# Patient Record
Sex: Female | Born: 1940
Health system: Southern US, Community
[De-identification: ages and names within clinical notes are randomized; demographics above are authoritative.]

## PROBLEM LIST (undated history)

## (undated) ENCOUNTER — Encounter

## (undated) ENCOUNTER — Ambulatory Visit

## (undated) ENCOUNTER — Telehealth

## (undated) ENCOUNTER — Ambulatory Visit: Attending: Pharmacist | Primary: Pharmacist

## (undated) ENCOUNTER — Encounter: Attending: Family Medicine | Primary: Family Medicine

## (undated) ENCOUNTER — Ambulatory Visit
Payer: MEDICARE | Attending: Adult Reconstructive Orthopaedic Surgery | Primary: Adult Reconstructive Orthopaedic Surgery

## (undated) ENCOUNTER — Ambulatory Visit: Payer: MEDICARE

## (undated) ENCOUNTER — Encounter: Attending: Anesthesiology | Primary: Anesthesiology

## (undated) ENCOUNTER — Ambulatory Visit: Payer: MEDICARE | Attending: Anesthesiology | Primary: Anesthesiology

## (undated) ENCOUNTER — Telehealth: Attending: Certified Registered" | Primary: Certified Registered"

## (undated) ENCOUNTER — Encounter: Attending: Certified Registered" | Primary: Certified Registered"

## (undated) ENCOUNTER — Encounter: Attending: Surgery | Primary: Surgery

## (undated) ENCOUNTER — Telehealth: Attending: Infectious Disease | Primary: Infectious Disease

## (undated) DIAGNOSIS — M1990 Unspecified osteoarthritis, unspecified site: Secondary | ICD-10-CM

## (undated) DIAGNOSIS — N200 Calculus of kidney: Secondary | ICD-10-CM

## (undated) DIAGNOSIS — F419 Anxiety disorder, unspecified: Secondary | ICD-10-CM

## (undated) DIAGNOSIS — I10 Essential (primary) hypertension: Secondary | ICD-10-CM

## (undated) DIAGNOSIS — Z944 Liver transplant status: Secondary | ICD-10-CM

## (undated) DIAGNOSIS — Z87442 Personal history of urinary calculi: Secondary | ICD-10-CM

## (undated) DIAGNOSIS — R002 Palpitations: Secondary | ICD-10-CM

## (undated) DIAGNOSIS — M109 Gout, unspecified: Secondary | ICD-10-CM

## (undated) DIAGNOSIS — E785 Hyperlipidemia, unspecified: Secondary | ICD-10-CM

## (undated) DIAGNOSIS — M549 Dorsalgia, unspecified: Secondary | ICD-10-CM

## (undated) DIAGNOSIS — G8929 Other chronic pain: Secondary | ICD-10-CM

## (undated) HISTORY — PX: BACK SURGERY: SHX140

## (undated) HISTORY — PX: LIVER TRANSPLANT: SHX410

## (undated) HISTORY — PX: APPENDECTOMY: SHX54

## (undated) HISTORY — PX: ABDOMINAL HYSTERECTOMY: SHX81

## (undated) MED ORDER — CHOLECALCIFEROL (VITAMIN D3) 25 MCG (1,000 UNIT) TABLET: Freq: Every day | ORAL | 0 days

## (undated) MED ORDER — GABAPENTIN 100 MG CAPSULE: Freq: Every evening | ORAL | 0 days

---
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
## 2000-07-21 ENCOUNTER — Encounter: Payer: Self-pay | Admitting: Internal Medicine

## 2000-07-21 ENCOUNTER — Ambulatory Visit (HOSPITAL_COMMUNITY): Admission: RE | Admit: 2000-07-21 | Discharge: 2000-07-21 | Payer: Self-pay | Admitting: Internal Medicine

## 2000-08-22 ENCOUNTER — Ambulatory Visit (HOSPITAL_COMMUNITY): Admission: RE | Admit: 2000-08-22 | Discharge: 2000-08-22 | Payer: Self-pay | Admitting: Internal Medicine

## 2000-08-25 ENCOUNTER — Emergency Department (HOSPITAL_COMMUNITY): Admission: EM | Admit: 2000-08-25 | Discharge: 2000-08-25 | Payer: Self-pay | Admitting: Internal Medicine

## 2000-09-12 ENCOUNTER — Encounter: Payer: Self-pay | Admitting: Family Medicine

## 2000-09-12 ENCOUNTER — Ambulatory Visit (HOSPITAL_COMMUNITY): Admission: RE | Admit: 2000-09-12 | Discharge: 2000-09-12 | Payer: Self-pay | Admitting: Family Medicine

## 2000-10-26 ENCOUNTER — Emergency Department (HOSPITAL_COMMUNITY): Admission: EM | Admit: 2000-10-26 | Discharge: 2000-10-27 | Payer: Self-pay | Admitting: *Deleted

## 2000-11-21 ENCOUNTER — Ambulatory Visit (HOSPITAL_COMMUNITY): Admission: RE | Admit: 2000-11-21 | Discharge: 2000-11-21 | Payer: Self-pay | Admitting: Family Medicine

## 2000-11-21 ENCOUNTER — Encounter: Payer: Self-pay | Admitting: Family Medicine

## 2000-12-19 ENCOUNTER — Encounter: Payer: Self-pay | Admitting: Family Medicine

## 2000-12-19 ENCOUNTER — Ambulatory Visit (HOSPITAL_COMMUNITY): Admission: RE | Admit: 2000-12-19 | Discharge: 2000-12-19 | Payer: Self-pay | Admitting: Family Medicine

## 2001-05-14 ENCOUNTER — Encounter: Payer: Self-pay | Admitting: Family Medicine

## 2001-05-14 ENCOUNTER — Inpatient Hospital Stay (HOSPITAL_COMMUNITY): Admission: AD | Admit: 2001-05-14 | Discharge: 2001-05-16 | Payer: Self-pay | Admitting: Family Medicine

## 2001-06-04 ENCOUNTER — Other Ambulatory Visit: Admission: RE | Admit: 2001-06-04 | Discharge: 2001-06-04 | Payer: Self-pay | Admitting: Family Medicine

## 2001-06-09 ENCOUNTER — Emergency Department (HOSPITAL_COMMUNITY): Admission: EM | Admit: 2001-06-09 | Discharge: 2001-06-09 | Payer: Self-pay | Admitting: Emergency Medicine

## 2001-07-02 ENCOUNTER — Emergency Department (HOSPITAL_COMMUNITY): Admission: EM | Admit: 2001-07-02 | Discharge: 2001-07-03 | Payer: Self-pay | Admitting: *Deleted

## 2001-07-20 ENCOUNTER — Emergency Department (HOSPITAL_COMMUNITY): Admission: EM | Admit: 2001-07-20 | Discharge: 2001-07-20 | Payer: Self-pay | Admitting: Emergency Medicine

## 2001-07-30 ENCOUNTER — Encounter: Payer: Self-pay | Admitting: Urology

## 2001-07-30 ENCOUNTER — Ambulatory Visit (HOSPITAL_COMMUNITY): Admission: RE | Admit: 2001-07-30 | Discharge: 2001-07-30 | Payer: Self-pay | Admitting: Urology

## 2001-08-05 ENCOUNTER — Emergency Department (HOSPITAL_COMMUNITY): Admission: EM | Admit: 2001-08-05 | Discharge: 2001-08-06 | Payer: Self-pay | Admitting: Emergency Medicine

## 2001-08-06 ENCOUNTER — Encounter: Payer: Self-pay | Admitting: Emergency Medicine

## 2001-11-19 ENCOUNTER — Other Ambulatory Visit: Admission: RE | Admit: 2001-11-19 | Discharge: 2001-11-19 | Payer: Self-pay | Admitting: Obstetrics and Gynecology

## 2001-12-19 ENCOUNTER — Encounter: Payer: Self-pay | Admitting: Family Medicine

## 2001-12-19 ENCOUNTER — Ambulatory Visit (HOSPITAL_COMMUNITY): Admission: RE | Admit: 2001-12-19 | Discharge: 2001-12-19 | Payer: Self-pay | Admitting: Family Medicine

## 2002-01-15 ENCOUNTER — Ambulatory Visit (HOSPITAL_COMMUNITY): Admission: RE | Admit: 2002-01-15 | Discharge: 2002-01-15 | Payer: Self-pay | Admitting: Family Medicine

## 2002-01-15 ENCOUNTER — Encounter: Payer: Self-pay | Admitting: Family Medicine

## 2002-07-28 ENCOUNTER — Emergency Department (HOSPITAL_COMMUNITY): Admission: EM | Admit: 2002-07-28 | Discharge: 2002-07-28 | Payer: Self-pay | Admitting: *Deleted

## 2002-10-13 ENCOUNTER — Emergency Department (HOSPITAL_COMMUNITY): Admission: EM | Admit: 2002-10-13 | Discharge: 2002-10-13 | Payer: Self-pay | Admitting: *Deleted

## 2002-12-27 ENCOUNTER — Encounter: Payer: Self-pay | Admitting: Family Medicine

## 2002-12-27 ENCOUNTER — Ambulatory Visit (HOSPITAL_COMMUNITY): Admission: RE | Admit: 2002-12-27 | Discharge: 2002-12-27 | Payer: Self-pay | Admitting: Family Medicine

## 2002-12-28 ENCOUNTER — Emergency Department (HOSPITAL_COMMUNITY): Admission: EM | Admit: 2002-12-28 | Discharge: 2002-12-28 | Payer: Self-pay | Admitting: *Deleted

## 2003-02-05 ENCOUNTER — Emergency Department (HOSPITAL_COMMUNITY): Admission: EM | Admit: 2003-02-05 | Discharge: 2003-02-06 | Payer: Self-pay | Admitting: *Deleted

## 2003-02-07 ENCOUNTER — Emergency Department (HOSPITAL_COMMUNITY): Admission: EM | Admit: 2003-02-07 | Discharge: 2003-02-07 | Payer: Self-pay | Admitting: Emergency Medicine

## 2003-04-12 ENCOUNTER — Observation Stay (HOSPITAL_COMMUNITY): Admission: EM | Admit: 2003-04-12 | Discharge: 2003-04-13 | Payer: Self-pay | Admitting: Emergency Medicine

## 2003-07-08 ENCOUNTER — Ambulatory Visit (HOSPITAL_COMMUNITY): Admission: RE | Admit: 2003-07-08 | Discharge: 2003-07-08 | Payer: Self-pay | Admitting: Family Medicine

## 2003-09-22 ENCOUNTER — Encounter: Admission: RE | Admit: 2003-09-22 | Discharge: 2003-09-22 | Payer: Self-pay | Admitting: Family Medicine

## 2003-10-10 ENCOUNTER — Encounter: Admission: RE | Admit: 2003-10-10 | Discharge: 2003-10-10 | Payer: Self-pay | Admitting: Family Medicine

## 2003-10-24 ENCOUNTER — Encounter: Admission: RE | Admit: 2003-10-24 | Discharge: 2003-10-24 | Payer: Self-pay | Admitting: Family Medicine

## 2003-11-20 ENCOUNTER — Emergency Department (HOSPITAL_COMMUNITY): Admission: EM | Admit: 2003-11-20 | Discharge: 2003-11-20 | Payer: Self-pay | Admitting: Emergency Medicine

## 2003-12-28 ENCOUNTER — Emergency Department (HOSPITAL_COMMUNITY): Admission: EM | Admit: 2003-12-28 | Discharge: 2003-12-28 | Payer: Self-pay | Admitting: Emergency Medicine

## 2004-01-05 ENCOUNTER — Ambulatory Visit (HOSPITAL_COMMUNITY): Admission: RE | Admit: 2004-01-05 | Discharge: 2004-01-05 | Payer: Self-pay | Admitting: Family Medicine

## 2004-03-16 ENCOUNTER — Encounter (HOSPITAL_COMMUNITY): Admission: RE | Admit: 2004-03-16 | Discharge: 2004-04-15 | Payer: Self-pay | Admitting: Neurosurgery

## 2004-03-29 ENCOUNTER — Emergency Department (HOSPITAL_COMMUNITY): Admission: EM | Admit: 2004-03-29 | Discharge: 2004-03-29 | Payer: Self-pay | Admitting: Emergency Medicine

## 2004-04-19 ENCOUNTER — Encounter: Admission: RE | Admit: 2004-04-19 | Discharge: 2004-04-19 | Payer: Self-pay | Admitting: Family Medicine

## 2004-05-10 ENCOUNTER — Encounter: Admission: RE | Admit: 2004-05-10 | Discharge: 2004-05-10 | Payer: Self-pay | Admitting: Family Medicine

## 2004-06-11 ENCOUNTER — Encounter: Admission: RE | Admit: 2004-06-11 | Discharge: 2004-06-11 | Payer: Self-pay | Admitting: Family Medicine

## 2005-01-10 ENCOUNTER — Ambulatory Visit (HOSPITAL_COMMUNITY): Admission: RE | Admit: 2005-01-10 | Discharge: 2005-01-10 | Payer: Self-pay | Admitting: Family Medicine

## 2005-01-13 ENCOUNTER — Ambulatory Visit (HOSPITAL_COMMUNITY): Admission: RE | Admit: 2005-01-13 | Discharge: 2005-01-13 | Payer: Self-pay | Admitting: Family Medicine

## 2005-04-11 ENCOUNTER — Ambulatory Visit (HOSPITAL_COMMUNITY): Admission: RE | Admit: 2005-04-11 | Discharge: 2005-04-11 | Payer: Self-pay | Admitting: Family Medicine

## 2005-06-07 ENCOUNTER — Encounter (HOSPITAL_COMMUNITY): Admission: RE | Admit: 2005-06-07 | Discharge: 2005-07-07 | Payer: Self-pay | Admitting: Orthopedic Surgery

## 2005-06-20 ENCOUNTER — Emergency Department (HOSPITAL_COMMUNITY): Admission: EM | Admit: 2005-06-20 | Discharge: 2005-06-21 | Payer: Self-pay | Admitting: Emergency Medicine

## 2005-07-12 ENCOUNTER — Encounter (HOSPITAL_COMMUNITY): Admission: RE | Admit: 2005-07-12 | Discharge: 2005-08-11 | Payer: Self-pay | Admitting: Orthopedic Surgery

## 2005-11-02 ENCOUNTER — Emergency Department (HOSPITAL_COMMUNITY): Admission: EM | Admit: 2005-11-02 | Discharge: 2005-11-02 | Payer: Self-pay | Admitting: Emergency Medicine

## 2005-11-06 ENCOUNTER — Emergency Department (HOSPITAL_COMMUNITY): Admission: EM | Admit: 2005-11-06 | Discharge: 2005-11-06 | Payer: Self-pay | Admitting: Emergency Medicine

## 2005-11-28 ENCOUNTER — Encounter (HOSPITAL_COMMUNITY): Admission: RE | Admit: 2005-11-28 | Discharge: 2005-12-28 | Payer: Self-pay | Admitting: Orthopaedic Surgery

## 2006-01-13 ENCOUNTER — Ambulatory Visit (HOSPITAL_COMMUNITY): Admission: RE | Admit: 2006-01-13 | Discharge: 2006-01-13 | Payer: Self-pay | Admitting: Family Medicine

## 2006-04-25 ENCOUNTER — Ambulatory Visit (HOSPITAL_COMMUNITY): Admission: RE | Admit: 2006-04-25 | Discharge: 2006-04-25 | Payer: Self-pay | Admitting: Family Medicine

## 2006-04-26 ENCOUNTER — Ambulatory Visit (HOSPITAL_COMMUNITY): Admission: RE | Admit: 2006-04-26 | Discharge: 2006-04-26 | Payer: Self-pay | Admitting: Family Medicine

## 2007-01-16 ENCOUNTER — Emergency Department (HOSPITAL_COMMUNITY): Admission: EM | Admit: 2007-01-16 | Discharge: 2007-01-16 | Payer: Self-pay | Admitting: Emergency Medicine

## 2007-01-16 ENCOUNTER — Ambulatory Visit (HOSPITAL_COMMUNITY): Admission: RE | Admit: 2007-01-16 | Discharge: 2007-01-16 | Payer: Self-pay | Admitting: Family Medicine

## 2007-06-11 ENCOUNTER — Ambulatory Visit (HOSPITAL_COMMUNITY): Admission: RE | Admit: 2007-06-11 | Discharge: 2007-06-11 | Payer: Self-pay | Admitting: Family Medicine

## 2008-02-08 ENCOUNTER — Ambulatory Visit (HOSPITAL_COMMUNITY): Admission: RE | Admit: 2008-02-08 | Discharge: 2008-02-08 | Payer: Self-pay | Admitting: Family Medicine

## 2008-05-16 ENCOUNTER — Emergency Department (HOSPITAL_COMMUNITY): Admission: EM | Admit: 2008-05-16 | Discharge: 2008-05-16 | Payer: Self-pay | Admitting: Emergency Medicine

## 2009-02-16 ENCOUNTER — Ambulatory Visit (HOSPITAL_COMMUNITY): Admission: RE | Admit: 2009-02-16 | Discharge: 2009-02-16 | Payer: Self-pay | Admitting: Family Medicine

## 2009-08-27 ENCOUNTER — Ambulatory Visit (HOSPITAL_COMMUNITY): Admission: RE | Admit: 2009-08-27 | Discharge: 2009-08-27 | Payer: Self-pay | Admitting: Family Medicine

## 2009-10-13 ENCOUNTER — Ambulatory Visit (HOSPITAL_COMMUNITY): Admission: RE | Admit: 2009-10-13 | Discharge: 2009-10-13 | Payer: Self-pay | Admitting: Family Medicine

## 2010-02-19 ENCOUNTER — Ambulatory Visit (HOSPITAL_COMMUNITY): Admission: RE | Admit: 2010-02-19 | Discharge: 2010-02-19 | Payer: Self-pay | Admitting: Family Medicine

## 2010-04-24 ENCOUNTER — Encounter: Payer: Self-pay | Admitting: Family Medicine

## 2010-04-25 ENCOUNTER — Encounter: Payer: Self-pay | Admitting: Family Medicine

## 2010-07-20 LAB — CBC
HCT: 39.3 % (ref 36.0–46.0)
Hemoglobin: 13.2 g/dL (ref 12.0–15.0)
MCHC: 33.7 g/dL (ref 30.0–36.0)
MCV: 89.6 fL (ref 78.0–100.0)
Platelets: 307 10*3/uL (ref 150–400)
RBC: 4.38 MIL/uL (ref 3.87–5.11)
RDW: 13.9 % (ref 11.5–15.5)
WBC: 8.6 10*3/uL (ref 4.0–10.5)

## 2010-07-20 LAB — BASIC METABOLIC PANEL
BUN: 18 mg/dL (ref 6–23)
CO2: 24 mEq/L (ref 19–32)
Calcium: 8.8 mg/dL (ref 8.4–10.5)
Chloride: 106 mEq/L (ref 96–112)
Creatinine, Ser: 0.9 mg/dL (ref 0.4–1.2)
GFR calc Af Amer: >60 mL/min (ref >60)
GFR calc non Af Amer: >60 mL/min (ref >60)
Glucose, Bld: 128 mg/dL — ABNORMAL HIGH (ref 70–99)
Potassium: 3.5 mEq/L (ref 3.5–5.1)
Sodium: 140 mEq/L (ref 135–145)

## 2010-07-20 LAB — URINALYSIS, ROUTINE W REFLEX MICROSCOPIC
Bilirubin Urine: NEGATIVE
Ketones, ur: NEGATIVE mg/dL
Nitrite: NEGATIVE
Protein, ur: NEGATIVE mg/dL
Specific Gravity, Urine: 1.02 (ref 1.005–1.030)
Urobilinogen, UA: 0.2 mg/dL (ref 0.0–1.0)

## 2010-07-20 LAB — DIFFERENTIAL
Basophils Relative: 1 % (ref 0–1)
Eosinophils Relative: 2 % (ref 0–5)
Lymphocytes Relative: 19 % (ref 12–46)
Monocytes Absolute: 0.7 10*3/uL (ref 0.1–1.0)
Monocytes Relative: 8 % (ref 3–12)
Neutro Abs: 6.1 10*3/uL (ref 1.7–7.7)

## 2010-07-20 LAB — URINE MICROSCOPIC-ADD ON

## 2010-07-20 LAB — POCT CARDIAC MARKERS
CKMB, poc: 1.1 ng/mL (ref 1.0–8.0)
CKMB, poc: <1 ng/mL — ABNORMAL LOW (ref 1.0–8.0)
Myoglobin, poc: 99.1 ng/mL (ref 12–200)
Troponin i, poc: <0.05 ng/mL (ref 0.00–0.09)
Troponin i, poc: <0.05 ng/mL (ref 0.00–0.09)

## 2010-08-02 ENCOUNTER — Other Ambulatory Visit (HOSPITAL_COMMUNITY): Payer: Self-pay | Admitting: Family Medicine

## 2010-08-02 ENCOUNTER — Ambulatory Visit (HOSPITAL_COMMUNITY)
Admission: RE | Admit: 2010-08-02 | Discharge: 2010-08-02 | Disposition: A | Discharge status: Home / Self Care | Payer: Medicare Other | Source: Ambulatory Visit | Attending: Family Medicine | Admitting: Family Medicine

## 2010-08-02 ENCOUNTER — Encounter (HOSPITAL_COMMUNITY): Payer: Self-pay

## 2010-08-02 DIAGNOSIS — R059 Cough, unspecified: Secondary | ICD-10-CM | POA: Insufficient documentation

## 2010-08-02 DIAGNOSIS — R058 Other specified cough: Secondary | ICD-10-CM

## 2010-08-02 DIAGNOSIS — R509 Fever, unspecified: Secondary | ICD-10-CM

## 2010-08-02 DIAGNOSIS — R05 Cough: Secondary | ICD-10-CM

## 2010-08-02 HISTORY — DX: Liver transplant status: Z94.4

## 2010-08-20 NOTE — Discharge Summary (Signed)
[facility]  Patient:    Linda Barr, Linda Barr Visit Number: [id number] MRN: [id number]          Service Type: EMS Location: ED Attending Physician:  Herbert Seta Dictated by:   Butch Penny, M.D. Admit Date:  06/09/2001 Discharge Date: 06/09/2001                             Discharge Summary  DIAGNOSES: 1. Gastroenteritis, acute. 2. Hypokalemia. 3. History of liver transplant. 4. History of hypertension. 5. Hyperlipidemia.  CONDITION:  Stable and improved at time of discharge.  BRIEF HISTORY:  This 70 year old white female was seen in the office on the day of admission for acute flank pain, severe vomiting, and diarrhea for approximately 24 hours.  The patient apparently had been having episodes of intermittent abdominal discomfort for a day or 2 prior to the onset of nausea and vomiting.  She stated that she could not keep anything down but had multiple episodes of vomiting and diarrhea and epigastric discomfort over 24 hours.  The patient appeared to be somewhat dehydrated in the office. The patient had a history of liver transplant x2 at [facility].  Because of the patients rather obvious discomfort and dehydration it was felt she should be admitted to the hospital for IV fluids and further evaluation.  PHYSICAL EXAMINATION:  GENERAL:  Uncomfortable white female.  VITAL SIGNS:  Blood pressure 140/80, pulse 60, respirations 18, temperature 100.0.  HEENT:  Eyes:  PERRLA.  TM negative.  Oropharynx benign.  NECK:  Supple.  No JVD, or thyroid abnormalities.  LUNGS:  Clear to percussion and auscultation.  HEART:  Regular rate and rhythm no murmurs.  ABDOMEN:  Tenderness in the mid abdomen.  SKIN:  Loss of tissue turgor.  EXTREMITIES:  Free of edema.  LABORATORY DATA:  Admission CBC, WBC 10,600 with hemoglobin 13.3, hematocrit 38.1, 92 neutrophils, 3 lymphocytes.  Chemistries:  Sodium 140, potassium 3.9, chloride 109, CO2 24, glucose 126, BUN  15, creatinine 0.9.  Calcium 8.6.  Subsequent chemistries on May 15, 2001:  Sodium 140, potassium 3.8, chloride 109, CO2 27, glucose 104, BUN 9, creatinine 1.0.   Liver profile: AST 16, ALT 20, ALP 73, total bilirubin 0.4.  Urinalysis essentially negative. Urine culture no growth.  C. difficile and stool was negative.  Stool cultures were negative. RPR nonreactive.  X-RAYS:   Chest x-ray with no evidence of infiltrate or congestive heart failure.  HOSPITAL COURSE:  The patient at the time of admission was placed on D-5 1/2 normal saline at 125 cc./hr. and vital signs q.i.d.  MEDICATIONS: 1. She was given Phenergan IV 12.5 mg q.4h. p.r.n. for nausea. 2. She was started on IV Levaquin 500 mg daily. 3. Levbid 1 b.i.d. 4. Morphine 4 mg IV q.4h. p.r.n. for pain. 5. Robitussin 1 teaspoon q.i.d. 6. She was continued on lipitor 10 mg daily. 7. Verapamil 240 mg daily.  The patient improved shortly after admission and was started on a soft diet. The patient showed progressive improvement with reference to her dehydration and was able to be discharged after 2 days of hospitalization.  DISCHARGE MEDICATIONS: 1. The patient was discharged on Levaquin 500 mg daily. 2. Verapamil SR 240 mg daily. 3. Levbid 1 b.i.d. 4. Robitussin 1 teaspoon q.4h p.r.n. Dictated by:   Butch Penny, M.D. Attending Physician:  Herbert Seta DD:  06/19/01 TD:  06/20/01 Job: [id number]

## 2010-08-20 NOTE — H&P (Signed)
[facility]  Patient:    Linda Barr, Linda Barr Visit Number: [id number] MRN: [id number]          Service Type: MED Location: 3A A330 01 Attending Physician:  Alice Reichert Dictated by:   Butch Penny, M.D. Admit Date:  05/14/2001                           History and Physical  CHIEF COMPLAINT:  This is a 70 year old white female who was seen in the office on the day of admission with the chief complaint being severe vomiting and diarrhea for approximately 24 hours.  HISTORY OF PRESENT ILLNESS:  This patient apparently had been having episodes of intermittent abdominal discomfort for a day or two prior to the onset of vomiting and diarrhea.  She states that she cannot keep anything down.  She has had multiple episodes of vomiting and diarrhea and epigastric discomfort for the last 24 hours.  The patient appeared to be somewhat dehydrated in the office.  This patient has had a previous history of liver transplant x 2 at [facility]  Because of the patients rather obvious discomfort and dehydration, it was felt that she should be admitted to the hospital for IV fluids and further evaluation.  FAMILY HISTORY:  Mother had a heart attack.  An uncle was a diabetic.  SOCIAL HISTORY:  The patient is a former cigarette smoker.  Does not smoke or drink now.  ALLERGIES:  CODEINE.  CURRENT MEDICATIONS 1. Lipitor 10 mg. 2. Verapamil 240 mg. 3. Prometa 2 mg b.i.d.  PAST MEDICAL/SURGICAL HISTORY 1. The patient has a history of having had a liver transplant at [facility]    in 1994, and then again one year later. 2. She has a history of hypertension. 3. History of chronic low back pain. 4. Apparently the patient did have hepatitis prior to the liver transplant.  REVIEW OF SYSTEMS:  HEENT:  Negative except for nasal congestion. CARDIOPULMONARY:  The patient did have some cough which was productive.  A low-grade fever as well.  No hemoptysis or dyspnea.  GI:  Bouts of  nausea and vomiting, epigastric pain.  PHYSICAL EXAMINATION  GENERAL:  An uncomfortable white female.  VITAL SIGNS:  Blood pressure 140/80, pulse 60, respirations 18, temperature 100 degrees.  HEENT:  Pupils equal, round, reactive to light and accommodation.  Tympanic membranes negative.  Oropharynx benign.  NECK:  Supple with no jugular venous distention or thyroid abnormalities.  LUNGS:  Clear to P&A.  HEART:  Regular rhythm, no murmurs.  ABDOMEN:  Tenderness in the midabdomen.  SKIN:  Loss of skin turgor.  EXTREMITIES:  Free of edema.  ADMITTING DIAGNOSES 1. Gastroenteritis, acute. 2. History of liver transplant. Dictated by:   Butch Penny, M.D. Attending Physician:  Alice Reichert DD:  05/14/01 TD:  05/14/01 Job: [id number]

## 2010-08-20 NOTE — Group Therapy Note (Signed)
NAME:  Linda Barr, Linda Barr NO.:  [id number]   MEDICAL RECORD NO.:  [id number]                  PATIENT TYPE:   LOCATION:                                       FACILITY:   PHYSICIAN:  Angus G. Renard Matter, M.D.              DATE OF BIRTH:   DATE OF PROCEDURE:  04/13/2003  DATE OF DISCHARGE:                                   PROGRESS NOTE   This patient was admitted with nausea and vomiting secondary to medication  which she received in the ED. She was admitted through the ED with severe  back pain. She was unrelieved by Toradol and was given Dilaudid and began to  vomit and had retching following this.   OBJECTIVE:  VITAL SIGNS: Blood pressure 114/73, respirations 20, pulse 59,  temperature 98.1. LUNGS:  Clear to P&A.  HEART:  Regular rhythm.  ABDOMEN:  No palpable organs or masses.   ASSESSMENT:  The patient was admitted with retching and vomiting following  pain medication.  She appears to be better and will be discharged today.      ___________________________________________                                            Ishmael Holter. Renard Matter, M.D.   AGM/MEDQ  D:  04/13/2003  T:  04/13/2003  Job:  [id number]

## 2010-08-20 NOTE — H&P (Signed)
NAME:  Linda Barr, Linda Barr                            ACCOUNT NO.:  [id number]   MEDICAL RECORD NO.:  [id number]                   PATIENT TYPE:  OBV   LOCATION:  A330                                 FACILITY:  [facility]   PHYSICIAN:  Angus G. Renard Matter, M.D.              DATE OF BIRTH:  [birth year]   DATE OF ADMISSION:  04/12/2003  DATE OF DISCHARGE:                                HISTORY & PHYSICAL   A 70 year old white female presented herself to the emergency department  with back pain. She was seen in the office this week with back pain of  increasing severity. She had had several spinal injections at [facility]  which she states did not help a great deal. She was placed on prednisone  analgesic as an outpatient, but this pain became progressively worse.  She  was seen in the emergency department, given injection of Toradol for pain.  This did not completely relieve the pain; therefore, she was given Dilaudid.  This made her sicker. She began vomiting and was unable to be discharged  because of intractable vomiting. She was found to have urinary tract  infection as well.   Lab studies revealed CBC with WBC of 9100, hemoglobin 12.9, hematocrit 38.3.  Chemistry reveals sodium 139, potassium 4.3, chloride 107, CO2 28, glucose  98, BUN 17, creatinine 0.9, calcium 9.2. Liver enzymes reveal total protein  6, albumin 3.7, SGOT 25, SGPT 30, alkaline phosphatase 91, bilirubin 0.4,  direct bilirubin 0.1, indirect bilirubin 0.3.   The patient was catheterized. A specimen was sent for culture and  sensitivity, was started on intravenous fluids, and subsequently was  admitted.   SOCIAL HISTORY:  The patient does not smoke or drink alcohol.   FAMILY HISTORY:  See previous record.   PAST MEDICAL AND SURGICAL HISTORY:  The patient has a history of liver  transplant at [facility] several  years ago because of liver failure  secondary to hepatitis C. The patient has a history of having had  hysterectomy, T&A.  She has a medical history of hypertension, kidney  stones, back pain.   ALLERGIES:  CODEINE and DEMEROL.   MEDICATIONS:  Verapamil, Tylenol, Ativan, Allegra.   REVIEW OF SYSTEMS:  HEENT:  Negative. CARDIOPULMONARY:  No cough,  hemoptysis, or dyspnea. GI: Bouts of vomiting. No diarrhea. Chronic back  pain.   PHYSICAL EXAMINATION:  GENERAL: Uncomfortable white female.  VITAL SIGNS: Blood pressure 146/97, pulse 79, respirations 18, temperature  98.  HEENT:  PERRLA. TMs negative. Oropharynx benign.  NECK: Supple with no JVD or thyroid abnormalities.  LUNGS: Clear to P&A.  HEART: Regular rhythm, no murmurs.  ABDOMEN: No palpable organs or masses.  BACK: No CVA tenderness.  NEUROLOGIC: No focal deficits.  SKIN: Warm and dry.  EXTREMITIES: Free of edema.   DIAGNOSES:  1. Intractable secondary to medication.  2. Urinary tract infection.  3. Low back pain.  4. Spinal stenosis.  5. History of liver transplant.     ___________________________________________                                         Ishmael Holter. Renard Matter, M.D.   AGM/MEDQ  D:  04/12/2003  T:  04/12/2003  Job:  [id number]

## 2010-10-04 ENCOUNTER — Emergency Department (HOSPITAL_COMMUNITY): Payer: Medicare Other

## 2010-10-04 ENCOUNTER — Emergency Department (HOSPITAL_COMMUNITY)
Admission: EM | Admit: 2010-10-04 | Discharge: 2010-10-04 | Disposition: A | Discharge status: Home / Self Care | Payer: Medicare Other | Attending: Emergency Medicine | Admitting: Emergency Medicine

## 2010-10-04 DIAGNOSIS — Z79899 Other long term (current) drug therapy: Secondary | ICD-10-CM | POA: Insufficient documentation

## 2010-10-04 DIAGNOSIS — Z944 Liver transplant status: Secondary | ICD-10-CM | POA: Insufficient documentation

## 2010-10-04 DIAGNOSIS — E785 Hyperlipidemia, unspecified: Secondary | ICD-10-CM | POA: Insufficient documentation

## 2010-10-04 DIAGNOSIS — R51 Headache: Secondary | ICD-10-CM | POA: Insufficient documentation

## 2010-10-04 LAB — URINALYSIS, ROUTINE W REFLEX MICROSCOPIC
Glucose, UA: NEGATIVE mg/dL
Hgb urine dipstick: NEGATIVE
Ketones, ur: NEGATIVE mg/dL
Protein, ur: NEGATIVE mg/dL
pH: 5.5 (ref 5.0–8.0)

## 2010-10-04 LAB — GLUCOSE, CSF: Glucose, CSF: 66 mg/dL (ref 43–76)

## 2010-10-04 LAB — CBC
HCT: 41.1 % (ref 36.0–46.0)
Hemoglobin: 13.3 g/dL (ref 12.0–15.0)
MCH: 30 pg (ref 26.0–34.0)
MCV: 92.6 fL (ref 78.0–100.0)
RBC: 4.44 MIL/uL (ref 3.87–5.11)
WBC: 11.2 10*3/uL — ABNORMAL HIGH (ref 4.0–10.5)

## 2010-10-04 LAB — URINE MICROSCOPIC-ADD ON

## 2010-10-04 LAB — PROTEIN, CSF: Total  Protein, CSF: 32 mg/dL (ref 15–45)

## 2010-10-04 LAB — BASIC METABOLIC PANEL
CO2: 26 mEq/L (ref 19–32)
Calcium: 9.2 mg/dL (ref 8.4–10.5)
Chloride: 104 mEq/L (ref 96–112)
Creatinine, Ser: 0.92 mg/dL (ref 0.50–1.10)
Glucose, Bld: 114 mg/dL — ABNORMAL HIGH (ref 70–99)

## 2010-10-04 LAB — CSF CELL COUNT WITH DIFFERENTIAL
Tube #: 4
WBC, CSF: 2 /mm3 (ref 0–5)

## 2010-10-04 LAB — DIFFERENTIAL
Eosinophils Absolute: 0.1 10*3/uL (ref 0.0–0.7)
Monocytes Relative: 9 % (ref 3–12)

## 2010-10-08 LAB — CSF CULTURE W GRAM STAIN: Culture: NO GROWTH

## 2011-01-03 ENCOUNTER — Other Ambulatory Visit (HOSPITAL_COMMUNITY): Payer: Self-pay | Admitting: Family Medicine

## 2011-01-03 DIAGNOSIS — Z139 Encounter for screening, unspecified: Secondary | ICD-10-CM

## 2011-01-13 LAB — COMPREHENSIVE METABOLIC PANEL
ALT: 19
AST: 16
Albumin: 3.9
Alkaline Phosphatase: 72
BUN: 22
CO2: 27
Calcium: 9
Chloride: 106
Creatinine, Ser: 1.06
GFR calc Af Amer: >60
GFR calc non Af Amer: 52 — ABNORMAL LOW
Glucose, Bld: 104 — ABNORMAL HIGH
Potassium: 4.3
Sodium: 138
Total Bilirubin: 0.6
Total Protein: 6.7

## 2011-01-13 LAB — DIFFERENTIAL
Basophils Absolute: 0
Basophils Relative: 0
Neutro Abs: 11.7 — ABNORMAL HIGH
Neutrophils Relative %: 77

## 2011-01-13 LAB — URINALYSIS, ROUTINE W REFLEX MICROSCOPIC
Nitrite: NEGATIVE
Specific Gravity, Urine: 1.01
Urobilinogen, UA: 0.2

## 2011-01-13 LAB — CBC
MCHC: 34.1
RDW: 14

## 2011-01-13 LAB — BASIC METABOLIC PANEL
BUN: 21
CO2: 27
Calcium: 9
Chloride: 106
Creatinine, Ser: 1.06
GFR calc Af Amer: >60
GFR calc non Af Amer: 52 — ABNORMAL LOW
Glucose, Bld: 104 — ABNORMAL HIGH
Potassium: 4.3
Sodium: 138

## 2011-01-13 LAB — URINE CULTURE

## 2011-01-13 LAB — URINE MICROSCOPIC-ADD ON

## 2011-02-25 ENCOUNTER — Ambulatory Visit (HOSPITAL_COMMUNITY)
Admission: RE | Admit: 2011-02-25 | Discharge: 2011-02-25 | Disposition: A | Discharge status: Home / Self Care | Payer: Medicare Other | Source: Ambulatory Visit | Attending: Family Medicine | Admitting: Family Medicine

## 2011-02-25 DIAGNOSIS — Z1231 Encounter for screening mammogram for malignant neoplasm of breast: Secondary | ICD-10-CM | POA: Insufficient documentation

## 2011-02-25 DIAGNOSIS — Z139 Encounter for screening, unspecified: Secondary | ICD-10-CM

## 2011-11-23 ENCOUNTER — Emergency Department (HOSPITAL_COMMUNITY)
Admission: EM | Admit: 2011-11-23 | Discharge: 2011-11-23 | Disposition: A | Discharge status: Home / Self Care | Payer: Medicare Other | Attending: Emergency Medicine | Admitting: Emergency Medicine

## 2011-11-23 ENCOUNTER — Encounter (HOSPITAL_COMMUNITY): Payer: Self-pay | Admitting: *Deleted

## 2011-11-23 ENCOUNTER — Emergency Department (HOSPITAL_COMMUNITY): Payer: Medicare Other

## 2011-11-23 DIAGNOSIS — G8929 Other chronic pain: Secondary | ICD-10-CM | POA: Insufficient documentation

## 2011-11-23 DIAGNOSIS — N390 Urinary tract infection, site not specified: Secondary | ICD-10-CM | POA: Insufficient documentation

## 2011-11-23 DIAGNOSIS — Z944 Liver transplant status: Secondary | ICD-10-CM | POA: Insufficient documentation

## 2011-11-23 DIAGNOSIS — E785 Hyperlipidemia, unspecified: Secondary | ICD-10-CM | POA: Insufficient documentation

## 2011-11-23 DIAGNOSIS — Z87442 Personal history of urinary calculi: Secondary | ICD-10-CM | POA: Insufficient documentation

## 2011-11-23 DIAGNOSIS — R002 Palpitations: Secondary | ICD-10-CM

## 2011-11-23 DIAGNOSIS — Z79899 Other long term (current) drug therapy: Secondary | ICD-10-CM | POA: Insufficient documentation

## 2011-11-23 DIAGNOSIS — I10 Essential (primary) hypertension: Secondary | ICD-10-CM | POA: Insufficient documentation

## 2011-11-23 HISTORY — DX: Calculus of kidney: N20.0

## 2011-11-23 HISTORY — DX: Other chronic pain: G89.29

## 2011-11-23 HISTORY — DX: Palpitations: R00.2

## 2011-11-23 HISTORY — DX: Dorsalgia, unspecified: M54.9

## 2011-11-23 LAB — CBC WITH DIFFERENTIAL/PLATELET
Basophils Absolute: 0 10*3/uL (ref 0.0–0.1)
Basophils Relative: 0 % (ref 0–1)
Eosinophils Absolute: 0.2 10*3/uL (ref 0.0–0.7)
Eosinophils Relative: 1 % (ref 0–5)
HCT: 44.3 % (ref 36.0–46.0)
MCH: 30.9 pg (ref 26.0–34.0)
MCHC: 33.2 g/dL (ref 30.0–36.0)
MCV: 93.1 fL (ref 78.0–100.0)
Monocytes Absolute: 1 10*3/uL (ref 0.1–1.0)
Neutro Abs: 7.8 10*3/uL — ABNORMAL HIGH (ref 1.7–7.7)
RDW: 13.5 % (ref 11.5–15.5)

## 2011-11-23 LAB — URINALYSIS, ROUTINE W REFLEX MICROSCOPIC
Bilirubin Urine: NEGATIVE
Ketones, ur: NEGATIVE mg/dL
Protein, ur: NEGATIVE mg/dL
Urobilinogen, UA: 0.2 mg/dL (ref 0.0–1.0)

## 2011-11-23 LAB — BASIC METABOLIC PANEL
Chloride: 104 mEq/L (ref 96–112)
GFR calc Af Amer: 77 mL/min — ABNORMAL LOW (ref >90)
GFR calc non Af Amer: 66 mL/min — ABNORMAL LOW (ref >90)
Potassium: 4.4 mEq/L (ref 3.5–5.1)

## 2011-11-23 LAB — URINE MICROSCOPIC-ADD ON

## 2011-11-23 MED ORDER — DEXTROSE 5 % IV SOLN
1.0000 g | Freq: Once | INTRAVENOUS | Status: AC
  Administered 2011-11-23: 1 g via INTRAVENOUS
  Filled 2011-11-23: qty 10

## 2011-11-23 MED ORDER — CEPHALEXIN 500 MG PO CAPS: 500.0000 mg | ORAL_CAPSULE | Freq: Four times a day (QID) | ORAL | Status: AC

## 2011-11-23 NOTE — ED Notes (Signed)
Pt c/o palpitations, weakness in her legs, cough and congestion, and urinary frequency. States that she has been coughing a lot and having back pain. History of liver transplant.

## 2011-11-23 NOTE — ED Provider Notes (Signed)
History     CSN: [id number]  Arrival date & time 11/23/11  1821   First MD Initiated Contact with Patient 11/23/11 1931      Chief Complaint  Patient presents with  . Palpitations    HPI Pt was seen at 1940.  Per pt, c/o gradual onset and persistence of constant urinary frequency and coughing since yesterday. Today she states she had an episode of her usual "palpitations" followed by "feeling weak all over."  Pt states she has a hx of chronic low back pain and "not sure if that is acting up too."  Denies CP/SOB, no abd pain, no N/V/D, no fevers, no rash.    Past Medical History  Diagnosis Date  . Hypertension   . Hx of liver transplant     1994, 1995 at [facility]  . Chronic back pain   . Palpitations   . Kidney stones     Past Surgical History  Procedure Date  . Liver transplant   . Abdominal hysterectomy   . Back surgery   . Appendectomy     History  Substance Use Topics  . Smoking status: Former Games developer  . Smokeless tobacco: Not on file  . Alcohol Use: No    Review of Systems ROS: Statement: All systems negative except as marked or noted in the HPI; Constitutional: Negative for fever and chills. ; ; Eyes: Negative for eye pain, redness and discharge. ; ; ENMT: Negative for ear pain, hoarseness, nasal congestion, sinus pressure and sore throat. ; ; Cardiovascular: Negative for chest pain, palpitations, diaphoresis, dyspnea and peripheral edema. ; ; Respiratory: +cough. Negative for wheezing and stridor. ; ; Gastrointestinal: Negative for nausea, vomiting, diarrhea, abdominal pain, blood in stool, hematemesis, jaundice and rectal bleeding. . ; ; Genitourinary: +dysuria, urinary frequency.  Negative for flank pain and hematuria. ; ; Musculoskeletal: +LBP. Negative for neck pain. Negative for swelling and trauma.; ; Skin: Negative for pruritus, rash, abrasions, blisters, bruising and skin lesion.; ; Neuro: Negative for headache, lightheadedness and neck stiffness. Negative for  weakness, altered level of consciousness , altered mental status, extremity weakness, paresthesias, involuntary movement, seizure and syncope.       Allergies  Codeine; Demerol; Dilaudid; and Morphine and related  Home Medications   Current Outpatient Rx  Name Route Sig Dispense Refill  . ACETAMINOPHEN 500 MG PO TABS Oral Take 500 mg by mouth 2 (two) times daily.    . ASPIRIN EC 81 MG PO TBEC Oral Take 81 mg by mouth 3 (three) times a week.    Marland Kitchen BIOTIN PO Oral Take 1 capsule by mouth daily.    Marland Kitchen EZETIMIBE 10 MG PO TABS Oral Take 10 mg by mouth at bedtime.    Marland Kitchen FLEXALL EX Apply externally Apply 1 application topically daily as needed. For pain    . PRAVASTATIN SODIUM 40 MG PO TABS Oral Take 40 mg by mouth at bedtime.    Marland Kitchen TACROLIMUS 1 MG PO CAPS Oral Take 1 mg by mouth 2 (two) times daily.    Marland Kitchen VERAPAMIL HCL ER 240 MG PO TBCR Oral Take 240 mg by mouth daily.      BP 171/87  Pulse 80  Temp 98.1 F (36.7 C) (Oral)  Resp 18  Ht 5' 4.5" (1.638 m)  Wt 195 lb (88.451 kg)  BMI 32.95 kg/m2  SpO2 97%  Physical Exam 1945: Physical examination:  Nursing notes reviewed; Vital signs and O2 SAT reviewed;  Constitutional: Well developed, Well nourished, Well hydrated,  In no acute distress; Head:  Normocephalic, atraumatic; Eyes: EOMI, PERRL, No scleral icterus; ENMT: Mouth and pharynx normal, Mucous membranes moist; Neck: Supple, Full range of motion, No lymphadenopathy; Cardiovascular: Regular rate and rhythm, No gallop; Respiratory: Breath sounds clear & equal bilaterally, No wheezes.  Speaking full sentences with ease, Normal respiratory effort/excursion; Chest: Nontender, Movement normal; Abdomen: Soft, Nontender, Nondistended, Normal bowel sounds; Genitourinary: No CVA tenderness; Spine:  No midline CS, TS, LS tenderness.  +TTP right lumbar paraspinal muscles; Extremities: Pulses normal, No tenderness, No edema, No calf edema or asymmetry.; Neuro: AA&Ox3, Major CN grossly intact.  Speech  clear. No facial droop. No gross focal motor or sensory deficits in extremities.; Skin: Color normal, Warm, Dry.   ED Course  Procedures   MDM  MDM Reviewed: nursing note, vitals and previous chart Reviewed previous: ECG Interpretation: ECG, labs and x-ray    Date: 11/23/2011  Rate: 85  Rhythm: normal sinus rhythm  QRS Axis: normal  Intervals: normal  ST/T Wave abnormalities: normal  Conduction Disutrbances:none  Narrative Interpretation:   Old EKG Reviewed: unchanged; no significant changes from previous EKG dated 10/04/2010.  Results for orders placed during the hospital encounter of 11/23/11  CBC WITH DIFFERENTIAL      Component Value Range   WBC 12.7 (*) 4.0 - 10.5 K/uL   RBC 4.76  3.87 - 5.11 MIL/uL   Hemoglobin 14.7  12.0 - 15.0 g/dL   HCT 46.9  62.9 - 52.8 %   MCV 93.1  78.0 - 100.0 fL   MCH 30.9  26.0 - 34.0 pg   MCHC 33.2  30.0 - 36.0 g/dL   RDW 41.3  24.4 - 01.0 %   Platelets 307  150 - 400 K/uL   Neutrophils Relative 62  43 - 77 %   Neutro Abs 7.8 (*) 1.7 - 7.7 K/uL   Lymphocytes Relative 29  12 - 46 %   Lymphs Abs 3.7  0.7 - 4.0 K/uL   Monocytes Relative 8  3 - 12 %   Monocytes Absolute 1.0  0.1 - 1.0 K/uL   Eosinophils Relative 1  0 - 5 %   Eosinophils Absolute 0.2  0.0 - 0.7 K/uL   Basophils Relative 0  0 - 1 %   Basophils Absolute 0.0  0.0 - 0.1 K/uL  TROPONIN I      Component Value Range   Troponin I <0.30  <0.30 ng/mL  URINALYSIS, ROUTINE W REFLEX MICROSCOPIC      Component Value Range   Color, Urine AMBER (*) YELLOW   APPearance CLEAR  CLEAR   Specific Gravity, Urine 1.025  1.005 - 1.030   pH 5.5  5.0 - 8.0   Glucose, UA NEGATIVE  NEGATIVE mg/dL   Hgb urine dipstick TRACE (*) NEGATIVE   Bilirubin Urine NEGATIVE  NEGATIVE   Ketones, ur NEGATIVE  NEGATIVE mg/dL   Protein, ur NEGATIVE  NEGATIVE mg/dL   Urobilinogen, UA 0.2  0.0 - 1.0 mg/dL   Nitrite NEGATIVE  NEGATIVE   Leukocytes, UA TRACE (*) NEGATIVE  BASIC METABOLIC PANEL      Component  Value Range   Sodium 139  135 - 145 mEq/L   Potassium 4.4  3.5 - 5.1 mEq/L   Chloride 104  96 - 112 mEq/L   CO2 26  19 - 32 mEq/L   Glucose, Bld 82  70 - 99 mg/dL   BUN 20  6 - 23 mg/dL   Creatinine, Ser 2.72  0.50 -  1.10 mg/dL   Calcium 9.8  8.4 - 40.9 mg/dL   GFR calc non Af Amer 66 (*) >90 mL/min   GFR calc Af Amer 77 (*) >90 mL/min  URINE MICROSCOPIC-ADD ON      Component Value Range   Squamous Epithelial / LPF RARE  RARE   WBC, UA 3-6  <3 WBC/hpf   RBC / HPF 3-6  <3 RBC/hpf   Bacteria, UA FEW (*) RARE   Dg Chest 2 View 11/23/2011  *RADIOLOGY REPORT*  Clinical Data: Cough  CHEST - 2 VIEW  Comparison: 10/04/2010  Findings: Heart size is at upper limits of normal.  The lungs are clear.  Mild flattening of hemidiaphragms may suggest emphysema. No pleural effusion.  No acute osseous finding.  IMPRESSION: Hyperinflation suggesting emphysema.  No acute finding.   Original Report Authenticated By: Harrel Lemon, M.D.      2130:  VS remain stable, HR 80's, NSR, afebrile.  Not orthostatic.  Pt states she felt "palpitations" on arrival to ED, but EKG completed in Triage on her arrival was NSR.  States she has been extensively eval by Cards Dr. Dietrich Pates for her palpitations and that this is not new for her.  Doubt ACS or PE as cause for her tachycardia at this time, continues to deny CP/SOB, not hypoxic, tachycardic or tachypneic in the ED today.  No abd pain on exam today, no hx aortic aneurysm on previous CT A/P.  Pt states she came to the ED today for eval of her cough and urinary frequency, concerned re: pneumonia or UTI.  Does not appear septic at this time.  Will tx for UTI, UC is pending; give 1st dose abx in the ED.  Pt wants to go home now.  Dx testing d/w pt and family.  Questions answered.  Verb understanding, agreeable to d/c home with outpt f/u.              Laray Anger, DO 11/26/11 [phone]

## 2011-11-24 LAB — URINE CULTURE: Colony Count: >=100000

## 2012-01-25 ENCOUNTER — Other Ambulatory Visit (HOSPITAL_COMMUNITY): Payer: Self-pay | Admitting: Family Medicine

## 2012-01-25 DIAGNOSIS — Z139 Encounter for screening, unspecified: Secondary | ICD-10-CM

## 2012-01-25 DIAGNOSIS — IMO0001 Reserved for inherently not codable concepts without codable children: Secondary | ICD-10-CM

## 2012-02-27 ENCOUNTER — Ambulatory Visit (HOSPITAL_COMMUNITY): Payer: Medicare Other

## 2012-03-09 ENCOUNTER — Ambulatory Visit (HOSPITAL_COMMUNITY): Payer: Medicare Other

## 2012-03-16 ENCOUNTER — Ambulatory Visit (HOSPITAL_COMMUNITY): Payer: Medicare Other

## 2012-03-30 ENCOUNTER — Ambulatory Visit (HOSPITAL_COMMUNITY): Payer: Medicare Other

## 2012-04-02 ENCOUNTER — Ambulatory Visit (HOSPITAL_COMMUNITY): Payer: Medicare Other

## 2012-04-29 ENCOUNTER — Emergency Department (HOSPITAL_COMMUNITY)
Admission: EM | Admit: 2012-04-29 | Discharge: 2012-04-29 | Disposition: A | Discharge status: Home / Self Care | Payer: Medicare Other | Attending: Emergency Medicine | Admitting: Emergency Medicine

## 2012-04-29 ENCOUNTER — Encounter (HOSPITAL_COMMUNITY): Payer: Self-pay | Admitting: Emergency Medicine

## 2012-04-29 DIAGNOSIS — Z87891 Personal history of nicotine dependence: Secondary | ICD-10-CM | POA: Insufficient documentation

## 2012-04-29 DIAGNOSIS — M5416 Radiculopathy, lumbar region: Secondary | ICD-10-CM

## 2012-04-29 DIAGNOSIS — Z8679 Personal history of other diseases of the circulatory system: Secondary | ICD-10-CM | POA: Insufficient documentation

## 2012-04-29 DIAGNOSIS — I10 Essential (primary) hypertension: Secondary | ICD-10-CM | POA: Insufficient documentation

## 2012-04-29 DIAGNOSIS — G8929 Other chronic pain: Secondary | ICD-10-CM | POA: Insufficient documentation

## 2012-04-29 DIAGNOSIS — Z87442 Personal history of urinary calculi: Secondary | ICD-10-CM | POA: Insufficient documentation

## 2012-04-29 DIAGNOSIS — M79609 Pain in unspecified limb: Secondary | ICD-10-CM | POA: Insufficient documentation

## 2012-04-29 DIAGNOSIS — IMO0002 Reserved for concepts with insufficient information to code with codable children: Secondary | ICD-10-CM | POA: Insufficient documentation

## 2012-04-29 DIAGNOSIS — Z7982 Long term (current) use of aspirin: Secondary | ICD-10-CM | POA: Insufficient documentation

## 2012-04-29 DIAGNOSIS — M545 Low back pain, unspecified: Secondary | ICD-10-CM | POA: Insufficient documentation

## 2012-04-29 DIAGNOSIS — M549 Dorsalgia, unspecified: Secondary | ICD-10-CM

## 2012-04-29 DIAGNOSIS — Z944 Liver transplant status: Secondary | ICD-10-CM | POA: Insufficient documentation

## 2012-04-29 DIAGNOSIS — Z79899 Other long term (current) drug therapy: Secondary | ICD-10-CM | POA: Insufficient documentation

## 2012-04-29 DIAGNOSIS — Z9889 Other specified postprocedural states: Secondary | ICD-10-CM | POA: Insufficient documentation

## 2012-04-29 MED ORDER — PREDNISONE 50 MG PO TABS
60.0000 mg | ORAL_TABLET | Freq: Once | ORAL | Status: AC
  Administered 2012-04-29: 60 mg via ORAL
  Filled 2012-04-29: qty 1

## 2012-04-29 MED ORDER — KETOROLAC TROMETHAMINE 60 MG/2ML IM SOLN: 60.0000 mg | Freq: Once | INTRAMUSCULAR | Status: DC

## 2012-04-29 MED ORDER — PREDNISONE 10 MG PO TABS: ORAL_TABLET | ORAL | Status: DC

## 2012-04-29 MED ORDER — KETOROLAC TROMETHAMINE 60 MG/2ML IM SOLN
60.0000 mg | Freq: Once | INTRAMUSCULAR | Status: AC
  Administered 2012-04-29: 60 mg via INTRAMUSCULAR
  Filled 2012-04-29: qty 2

## 2012-04-29 NOTE — ED Notes (Signed)
Pt c/o lower back  Pain that radiates down right leg, has hx of chronic lower back pain, denies any new injury,

## 2012-04-29 NOTE — ED Notes (Signed)
Patient c/o lower back pain that radiates into right leg. Per patient has chronic pain in back but the radiation into leg is new. Per patient has had liver surgery and is only able to take a limited amount of medications. Patient states "I haven't been able to go to [facility] where I usually go so I came here, cause this is happen ed before and I came here and they gave something that helped."

## 2012-04-29 NOTE — ED Notes (Signed)
Dr Wentz at bedside,  

## 2012-04-29 NOTE — ED Provider Notes (Signed)
History   This chart was scribed for Linda Melter, MD by Leone Payor, ED Scribe. This patient was seen in room APA18/APA18 and the patient's care was started at 1506.   CSN: [id number]  Arrival date & time 04/29/12  1229   First MD Initiated Contact with Patient 04/29/12 1506      Chief Complaint  Patient presents with  . Back Pain  . Leg Pain     The history is provided by the patient. No language interpreter was used.    GENIVA Linda Barr is a 72 y.o. female who presents to the Emergency Department complaining of constant, unchanged, severe lower back pain that radiates to the right leg starting 5 ago. Pt states the pain in her lower back is chronic but the radiation to the right leg is new. Pt had back surgery at [facility] years ago. Pt reports taking tylenol with mild relief. Pt reports only able to take a limited amount of medications due to having recent liver surgery. She denies fever, vomiting, dysuria, hematuria, constipation, change in bladder or bowel function.    Pt has h/o HTN, chronic back pain, liver transplant.  Pt is a former smoker but denies alcohol use.  Past Medical History  Diagnosis Date  . Hypertension   . Hx of liver transplant     1994, 1995 at [facility]  . Chronic back pain   . Palpitations   . Kidney stones     Past Surgical History  Procedure Date  . Liver transplant   . Abdominal hysterectomy   . Back surgery   . Appendectomy     No family history on file.  History  Substance Use Topics  . Smoking status: Former Smoker -- 1.0 packs/day    Types: Cigarettes    Quit date: 04/29/1990  . Smokeless tobacco: Never Used  . Alcohol Use: No    OB History    Grav Para Term Preterm Abortions TAB SAB Ect Mult Living   6 5 5  1  1   5       Review of Systems  A complete 10 system review of systems was obtained and all systems are negative except as noted in the HPI and PMH.    Allergies  Codeine; Demerol; Dilaudid; and Morphine and related  Home  Medications   Current Outpatient Rx  Name  Route  Sig  Dispense  Refill  . ACETAMINOPHEN 500 MG PO TABS   Oral   Take 500 mg by mouth every 6 (six) hours as needed. pain         . EZETIMIBE 10 MG PO TABS   Oral   Take 10 mg by mouth at bedtime.         Marland Kitchen PRAVASTATIN SODIUM 40 MG PO TABS   Oral   Take 40 mg by mouth at bedtime.         Marland Kitchen TACROLIMUS 1 MG PO CAPS   Oral   Take 1 mg by mouth 2 (two) times daily.         Marland Kitchen VERAPAMIL HCL ER 240 MG PO TBCR   Oral   Take 240 mg by mouth daily.         . ASPIRIN EC 81 MG PO TBEC   Oral   Take 81 mg by mouth 3 (three) times a week.         Marland Kitchen PREDNISONE 10 MG PO TABS      Take q day 6,5,4,3,2,1  21 tablet   0     BP 175/69  Pulse 102  Temp 97.6 F (36.4 C) (Oral)  Resp 20  Ht 5\' 5"  (1.651 m)  Wt 190 lb (86.183 kg)  BMI 31.62 kg/m2  SpO2 99%  Physical Exam  Nursing note and vitals reviewed. Constitutional: She is oriented to person, place, and time. She appears well-developed and well-nourished.  HENT:  Head: Normocephalic and atraumatic.  Eyes: Conjunctivae normal and EOM are normal. Pupils are equal, round, and reactive to light.  Neck: Normal range of motion and phonation normal. Neck supple.  Cardiovascular: Normal rate, regular rhythm and intact distal pulses.   Pulmonary/Chest: Effort normal and breath sounds normal. She exhibits no tenderness.  Abdominal: Soft. She exhibits no distension. There is no tenderness. There is no guarding.  Musculoskeletal: Normal range of motion.       Midline tenderness to low lumbar region. No step offs or deformities.   Positive straight leg raise on right at 45 degrees.   Neurological: She is alert and oriented to person, place, and time. She has normal strength. She exhibits normal muscle tone.  Skin: Skin is warm and dry.  Psychiatric: She has a normal mood and affect. Her behavior is normal. Judgment and thought content normal.    ED Course  Procedures  (including critical care time)  DIAGNOSTIC STUDIES: Oxygen Saturation is 99% on room air, normal by my interpretation.    COORDINATION OF CARE:  3:24 PM Discussed treatment plan which includes Prednisone and Toradol with pt at bedside and pt agreed to plan.    4:33PM she feels better, at this time. Pt advised to follow up with pain clinic, use heating pad and tylenol.   Nursing notes, applicable records and vitals reviewed.  Radiologic Images/Reports reviewed.   1. Back pain   2. Lumbar radicular pain       MDM  Exacerbation of chronic low back pain Doubt cauda equina, herniated disc or significant nerve impingement She is stable for discharge         I personally performed the services described in this documentation, which was scribed in my presence. The recorded information has been reviewed and is accurate.      Plan: Home Medications- prednisone, Tylenol; Home Treatments- heat application; Recommended follow up- follow up with pain management doctor, as soon as possible    Linda Melter, MD 04/29/12 2324

## 2012-05-19 ENCOUNTER — Encounter (HOSPITAL_COMMUNITY): Payer: Self-pay

## 2012-05-19 ENCOUNTER — Emergency Department (HOSPITAL_COMMUNITY)
Admission: EM | Admit: 2012-05-19 | Discharge: 2012-05-19 | Disposition: A | Discharge status: Home / Self Care | Payer: Medicare Other | Attending: Emergency Medicine | Admitting: Emergency Medicine

## 2012-05-19 DIAGNOSIS — I10 Essential (primary) hypertension: Secondary | ICD-10-CM | POA: Insufficient documentation

## 2012-05-19 DIAGNOSIS — Z7982 Long term (current) use of aspirin: Secondary | ICD-10-CM | POA: Insufficient documentation

## 2012-05-19 DIAGNOSIS — Z944 Liver transplant status: Secondary | ICD-10-CM | POA: Insufficient documentation

## 2012-05-19 DIAGNOSIS — M545 Low back pain, unspecified: Secondary | ICD-10-CM | POA: Insufficient documentation

## 2012-05-19 DIAGNOSIS — Z79899 Other long term (current) drug therapy: Secondary | ICD-10-CM | POA: Insufficient documentation

## 2012-05-19 DIAGNOSIS — G8929 Other chronic pain: Secondary | ICD-10-CM

## 2012-05-19 DIAGNOSIS — Z87891 Personal history of nicotine dependence: Secondary | ICD-10-CM | POA: Insufficient documentation

## 2012-05-19 DIAGNOSIS — Z87442 Personal history of urinary calculi: Secondary | ICD-10-CM | POA: Insufficient documentation

## 2012-05-19 DIAGNOSIS — Z9889 Other specified postprocedural states: Secondary | ICD-10-CM | POA: Insufficient documentation

## 2012-05-19 MED ORDER — PREDNISONE 20 MG PO TABS: 40.0000 mg | ORAL_TABLET | Freq: Every day | ORAL | Status: DC

## 2012-05-19 MED ORDER — KETOROLAC TROMETHAMINE 60 MG/2ML IM SOLN
60.0000 mg | Freq: Once | INTRAMUSCULAR | Status: AC
  Administered 2012-05-19: 60 mg via INTRAMUSCULAR
  Filled 2012-05-19: qty 2

## 2012-05-19 MED ORDER — KETOROLAC TROMETHAMINE 10 MG PO TABS: 10.0000 mg | ORAL_TABLET | Freq: Four times a day (QID) | ORAL | Status: DC | PRN

## 2012-05-19 MED ORDER — DEXAMETHASONE SODIUM PHOSPHATE 4 MG/ML IJ SOLN
10.0000 mg | Freq: Once | INTRAMUSCULAR | Status: AC
  Administered 2012-05-19: 10 mg via INTRAMUSCULAR
  Filled 2012-05-19: qty 3

## 2012-05-19 NOTE — ED Provider Notes (Signed)
History     This chart was scribed for Vida Roller, MD, MD by Smitty Pluck, ED Scribe. The patient was seen in room APA15/APA15 and the patient's care was started at 1:31 PM.   CSN: [id number]  Arrival date & time 05/19/12  1238      Chief Complaint  Patient presents with  . Back Pain     The history is provided by the patient and medical records. No language interpreter was used.   Linda Barr is a 72 y.o. female who presents to the Emergency Department complaining of constant, moderate lumbar back pain that radiates to right leg onset 2 weeks ago. Pt reports having back surgery due to similar pain 12 years ago and pain was relieved after surgery but came back. Pt has hx of liver transplant at [facility] and has treatment at pain clinic at [facility]. Pt has appointment March 14 at pain clinic. She states she normally gets Toradol which provides relief of pain. Pt uses a cane when walking.    Past Medical History  Diagnosis Date  . Hypertension   . Hx of liver transplant     1994, 1995 at [facility]  . Chronic back pain   . Palpitations   . Kidney stones     Past Surgical History  Procedure Laterality Date  . Liver transplant    . Abdominal hysterectomy    . Back surgery    . Appendectomy      No family history on file.  History  Substance Use Topics  . Smoking status: Former Smoker -- 1.00 packs/day    Types: Cigarettes    Quit date: 04/29/1990  . Smokeless tobacco: Never Used  . Alcohol Use: No    OB History   Grav Para Term Preterm Abortions TAB SAB Ect Mult Living   6 5 5  1  1   5       Review of Systems  Constitutional: Negative for fever and chills.  Respiratory: Negative for shortness of breath.   Gastrointestinal: Negative for nausea and vomiting.  Musculoskeletal: Positive for back pain.  Neurological: Negative for weakness.  All other systems reviewed and are negative.    Allergies  Codeine; Demerol; Dilaudid; and Morphine and related  Home Medications    Current Outpatient Rx  Name  Route  Sig  Dispense  Refill  . acetaminophen (TYLENOL) 500 MG tablet   Oral   Take 500 mg by mouth every 6 (six) hours as needed. pain         . aspirin EC 81 MG tablet   Oral   Take 81 mg by mouth 3 (three) times a week.         . ezetimibe (ZETIA) 10 MG tablet   Oral   Take 10 mg by mouth at bedtime.         . pravastatin (PRAVACHOL) 40 MG tablet   Oral   Take 40 mg by mouth at bedtime.         . tacrolimus (PROGRAF) 1 MG capsule   Oral   Take 1 mg by mouth 2 (two) times daily.         . verapamil (CALAN-SR) 240 MG CR tablet   Oral   Take 240 mg by mouth daily.         Marland Kitchen ketorolac (TORADOL) 10 MG tablet   Oral   Take 1 tablet (10 mg total) by mouth every 6 (six) hours as needed for pain.  15 tablet   0   . predniSONE (DELTASONE) 10 MG tablet      Take q day 6,5,4,3,2,1   21 tablet   0   . predniSONE (DELTASONE) 20 MG tablet   Oral   Take 2 tablets (40 mg total) by mouth daily. Take 40 mg by mouth daily for 3 days, then 20mg  by mouth daily for 3 days, then 10mg  daily for 3 days   12 tablet   0     BP 176/86  Pulse 114  Temp(Src) 97.8 F (36.6 C) (Oral)  Resp 16  SpO2 99%  Physical Exam  Nursing note and vitals reviewed. Constitutional: She is oriented to person, place, and time. She appears well-developed and well-nourished. No distress.  HENT:  Head: Normocephalic and atraumatic.  Eyes: EOM are normal. Pupils are equal, round, and reactive to light.  Neck: Normal range of motion. Neck supple. No tracheal deviation present.  Cardiovascular: Normal rate, regular rhythm and normal heart sounds.   Pulmonary/Chest: Effort normal. No respiratory distress.  Abdominal: Soft. She exhibits no distension.  Musculoskeletal: Normal range of motion.  Paraspinal tenderness in right lower back Mild paraspinal tenderness in thoracic   Neurological: She is alert and oriented to person, place, and time.  The patient has  normal speech, normal memory, normal coordination of all 4 extremities without any limb ataxia. The patient has normal strength of all 4 extremities at the major muscle groups including shoulders, biceps, triceps, forearm, grips, quadriceps, hamstrings, calves. Normal sensation to light touch and pinprick of all 4 extremities and the trunk. No truncal ataxia, cranial nerves III through XII are intact.  Normal peripheral visual fields. Normal extraocular movements. Normal pupillary exam. No pronator drift, normal reflexes at the brachial radialis, biceps and patellar tendons. Normal position sense  Skin: Skin is warm and dry.  Psychiatric: She has a normal mood and affect. Her behavior is normal.    ED Course  Procedures (including critical care time) DIAGNOSTIC STUDIES: Oxygen Saturation is 99% on room air, normal by my interpretation.    COORDINATION OF CARE: 1:35 PM Discussed ED treatment with pt and pt agrees.     Labs Reviewed - No data to display No results found.   1. Chronic back pain       MDM  The patient has a chronic pain which seems to come in waves. She has no signs of neurologic abnormalities, she requested Toradol and to followup with her pain clinic. This has been given, Decadron given, patient stable for discharge.   I personally performed the services described in this documentation, which was scribed in my presence. The recorded information has been reviewed and is accurate.         Vida Roller, MD 05/19/12 2135

## 2012-05-19 NOTE — ED Notes (Signed)
Pt states she needs a pain shot in her right hip.

## 2012-07-09 ENCOUNTER — Ambulatory Visit (HOSPITAL_COMMUNITY)
Admission: RE | Admit: 2012-07-09 | Discharge: 2012-07-09 | Disposition: A | Discharge status: Home / Self Care | Payer: Medicare Other | Source: Ambulatory Visit | Attending: Family Medicine | Admitting: Family Medicine

## 2012-07-09 DIAGNOSIS — Z139 Encounter for screening, unspecified: Secondary | ICD-10-CM

## 2012-07-09 DIAGNOSIS — Z1231 Encounter for screening mammogram for malignant neoplasm of breast: Secondary | ICD-10-CM | POA: Insufficient documentation

## 2012-09-07 DIAGNOSIS — M961 Postlaminectomy syndrome, not elsewhere classified: Secondary | ICD-10-CM | POA: Insufficient documentation

## 2012-12-24 DIAGNOSIS — Z944 Liver transplant status: Secondary | ICD-10-CM | POA: Insufficient documentation

## 2012-12-24 DIAGNOSIS — D849 Immunodeficiency, unspecified: Secondary | ICD-10-CM | POA: Insufficient documentation

## 2013-07-05 ENCOUNTER — Other Ambulatory Visit (HOSPITAL_COMMUNITY): Payer: Self-pay | Admitting: Family Medicine

## 2013-07-05 DIAGNOSIS — Z1231 Encounter for screening mammogram for malignant neoplasm of breast: Secondary | ICD-10-CM

## 2013-07-05 DIAGNOSIS — Z139 Encounter for screening, unspecified: Secondary | ICD-10-CM

## 2013-07-05 DIAGNOSIS — Z1239 Encounter for other screening for malignant neoplasm of breast: Secondary | ICD-10-CM

## 2013-07-15 ENCOUNTER — Ambulatory Visit (HOSPITAL_COMMUNITY)
Admission: RE | Admit: 2013-07-15 | Discharge: 2013-07-15 | Disposition: A | Discharge status: Home / Self Care | Payer: Medicare Other | Source: Ambulatory Visit | Attending: Family Medicine | Admitting: Family Medicine

## 2013-07-15 DIAGNOSIS — Z1231 Encounter for screening mammogram for malignant neoplasm of breast: Secondary | ICD-10-CM | POA: Insufficient documentation

## 2014-02-03 ENCOUNTER — Encounter (HOSPITAL_COMMUNITY): Payer: Self-pay

## 2014-02-25 DIAGNOSIS — M179 Osteoarthritis of knee, unspecified: Secondary | ICD-10-CM | POA: Insufficient documentation

## 2014-04-18 DIAGNOSIS — I10 Essential (primary) hypertension: Secondary | ICD-10-CM | POA: Diagnosis not present

## 2014-04-20 ENCOUNTER — Observation Stay (HOSPITAL_COMMUNITY)
Admission: EM | Admit: 2014-04-20 | Discharge: 2014-04-22 | Disposition: A | Discharge status: Home / Self Care | Payer: Medicare Other | Attending: Family Medicine | Admitting: Family Medicine

## 2014-04-20 ENCOUNTER — Emergency Department (HOSPITAL_COMMUNITY): Payer: Medicare Other

## 2014-04-20 ENCOUNTER — Encounter (HOSPITAL_COMMUNITY): Payer: Self-pay | Admitting: Emergency Medicine

## 2014-04-20 DIAGNOSIS — Z7982 Long term (current) use of aspirin: Secondary | ICD-10-CM | POA: Insufficient documentation

## 2014-04-20 DIAGNOSIS — H538 Other visual disturbances: Secondary | ICD-10-CM | POA: Diagnosis not present

## 2014-04-20 DIAGNOSIS — R079 Chest pain, unspecified: Principal | ICD-10-CM | POA: Diagnosis present

## 2014-04-20 DIAGNOSIS — G8929 Other chronic pain: Secondary | ICD-10-CM | POA: Insufficient documentation

## 2014-04-20 DIAGNOSIS — N200 Calculus of kidney: Secondary | ICD-10-CM | POA: Diagnosis not present

## 2014-04-20 DIAGNOSIS — I10 Essential (primary) hypertension: Secondary | ICD-10-CM

## 2014-04-20 DIAGNOSIS — R109 Unspecified abdominal pain: Secondary | ICD-10-CM | POA: Insufficient documentation

## 2014-04-20 DIAGNOSIS — I517 Cardiomegaly: Secondary | ICD-10-CM | POA: Diagnosis not present

## 2014-04-20 DIAGNOSIS — R110 Nausea: Secondary | ICD-10-CM | POA: Diagnosis not present

## 2014-04-20 DIAGNOSIS — R072 Precordial pain: Secondary | ICD-10-CM | POA: Diagnosis not present

## 2014-04-20 DIAGNOSIS — R05 Cough: Secondary | ICD-10-CM | POA: Insufficient documentation

## 2014-04-20 DIAGNOSIS — R6883 Chills (without fever): Secondary | ICD-10-CM | POA: Diagnosis not present

## 2014-04-20 DIAGNOSIS — Z79899 Other long term (current) drug therapy: Secondary | ICD-10-CM | POA: Diagnosis not present

## 2014-04-20 DIAGNOSIS — E785 Hyperlipidemia, unspecified: Secondary | ICD-10-CM | POA: Insufficient documentation

## 2014-04-20 DIAGNOSIS — R197 Diarrhea, unspecified: Secondary | ICD-10-CM | POA: Insufficient documentation

## 2014-04-20 DIAGNOSIS — J3489 Other specified disorders of nose and nasal sinuses: Secondary | ICD-10-CM | POA: Insufficient documentation

## 2014-04-20 DIAGNOSIS — Z944 Liver transplant status: Secondary | ICD-10-CM | POA: Diagnosis not present

## 2014-04-20 DIAGNOSIS — Z87891 Personal history of nicotine dependence: Secondary | ICD-10-CM | POA: Insufficient documentation

## 2014-04-20 HISTORY — DX: Hyperlipidemia, unspecified: E78.5

## 2014-04-20 LAB — CBC WITH DIFFERENTIAL/PLATELET
BASOS PCT: 0 % (ref 0–1)
Basophils Absolute: 0 10*3/uL (ref 0.0–0.1)
Eosinophils Absolute: 0.1 10*3/uL (ref 0.0–0.7)
Eosinophils Relative: 1 % (ref 0–5)
HCT: 43.2 % (ref 36.0–46.0)
Hemoglobin: 14 g/dL (ref 12.0–15.0)
LYMPHS PCT: 24 % (ref 12–46)
Lymphs Abs: 3.3 10*3/uL (ref 0.7–4.0)
MCH: 30.8 pg (ref 26.0–34.0)
MCHC: 32.4 g/dL (ref 30.0–36.0)
MCV: 95.2 fL (ref 78.0–100.0)
MONO ABS: 1.2 10*3/uL — AB (ref 0.1–1.0)
MONOS PCT: 9 % (ref 3–12)
Neutro Abs: 9.2 10*3/uL — ABNORMAL HIGH (ref 1.7–7.7)
Neutrophils Relative %: 66 % (ref 43–77)
PLATELETS: 312 10*3/uL (ref 150–400)
RBC: 4.54 MIL/uL (ref 3.87–5.11)
RDW: 14.3 % (ref 11.5–15.5)
WBC: 13.8 10*3/uL — ABNORMAL HIGH (ref 4.0–10.5)

## 2014-04-20 LAB — COMPREHENSIVE METABOLIC PANEL
ALT: 33 U/L (ref 0–35)
ANION GAP: 8 (ref 5–15)
AST: 28 U/L (ref 0–37)
Albumin: 4.3 g/dL (ref 3.5–5.2)
Alkaline Phosphatase: 69 U/L (ref 39–117)
BUN: 23 mg/dL (ref 6–23)
CALCIUM: 9.1 mg/dL (ref 8.4–10.5)
CO2: 25 mmol/L (ref 19–32)
Chloride: 103 mEq/L (ref 96–112)
Creatinine, Ser: 0.88 mg/dL (ref 0.50–1.10)
GFR calc non Af Amer: 64 mL/min — ABNORMAL LOW (ref >90)
GFR, EST AFRICAN AMERICAN: 74 mL/min — AB (ref >90)
GLUCOSE: 128 mg/dL — AB (ref 70–99)
POTASSIUM: 4.3 mmol/L (ref 3.5–5.1)
Sodium: 136 mmol/L (ref 135–145)
TOTAL PROTEIN: 6.9 g/dL (ref 6.0–8.3)
Total Bilirubin: 0.6 mg/dL (ref 0.3–1.2)

## 2014-04-20 LAB — URINALYSIS, ROUTINE W REFLEX MICROSCOPIC
BILIRUBIN URINE: NEGATIVE
GLUCOSE, UA: NEGATIVE mg/dL
Hgb urine dipstick: NEGATIVE
KETONES UR: NEGATIVE mg/dL
Leukocytes, UA: NEGATIVE
NITRITE: NEGATIVE
PH: 5.5 (ref 5.0–8.0)
Protein, ur: NEGATIVE mg/dL
Specific Gravity, Urine: 1.01 (ref 1.005–1.030)
Urobilinogen, UA: 0.2 mg/dL (ref 0.0–1.0)

## 2014-04-20 LAB — TROPONIN I

## 2014-04-20 NOTE — ED Notes (Signed)
Pt reports she is a two time liver transplant recipient, usually takes her anti rejection med at 2200.

## 2014-04-20 NOTE — ED Provider Notes (Addendum)
CSN: [id number]     Arrival date & time 04/20/14  2114 History  This chart was scribed for Linda Sorrow, MD by Lowella Petties, ED Scribe. The patient was seen in room APA11/APA11. Patient's care was started at 10:10 PM.      Chief Complaint  Patient presents with  . Chest Pain   Patient is a 74 y.o. female presenting with chest pain. The history is provided by the patient. No language interpreter was used.  Chest Pain Pain location:  Substernal area Pain quality: tightness   Pain radiates to:  Does not radiate Pain radiates to the back: no   Pain severity:  Moderate Onset quality:  Sudden Duration:  20 minutes Timing:  Intermittent Chronicity:  New Relieved by:  Nothing Worsened by:  Nothing tried Ineffective treatments:  None tried Associated symptoms: abdominal pain, cough and nausea   Associated symptoms: no back pain, no dizziness, no fever, no headache, no shortness of breath and not vomiting   HPI Comments: Linda Barr is a 74 y.o. female who presents to the Emergency Department complaining of intermittent, central, chest tightness which began earlier tonight between 8 and 8:30 and lasted about 20 minutes. She has not felt the pain since then. She had a flu shot 3 days ago at her appointment with Dr. Reita Chard, and she was started on Bystolic because her BP was high.She states that prior to this she had had diarrhea, nausea, and cough for 4 days, and she was put on imodium. 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   PCP: Lanette Hampshire, MD  Past Medical History  Diagnosis Date  . Hypertension   . Hx of liver transplant     1994, 1995 at [facility]  . Chronic back pain   . Palpitations   . Kidney stones    Past Surgical History  Procedure Laterality Date  . Liver transplant    . Abdominal hysterectomy    . Back surgery    . Appendectomy     Family History  Problem Relation Age of Onset  . Stroke Mother   . Stroke Other     History  Substance Use Topics  . Smoking status: Former Smoker -- 1.00 packs/day    Types: Cigarettes    Quit date: 04/29/1990  . Smokeless tobacco: Never Used  . Alcohol Use: No   OB History    Gravida Para Term Preterm AB TAB SAB Ectopic Multiple Living   6 5 5  1  1   5      Review of Systems  Constitutional: Positive for chills. Negative for fever.  HENT: Positive for rhinorrhea. Negative for sore throat.   Eyes: Positive for visual disturbance.  Respiratory: Positive for cough. Negative for shortness of breath.   Cardiovascular: Positive for chest pain. Negative for leg swelling.  Gastrointestinal: Positive for nausea, abdominal pain and diarrhea. Negative for vomiting.  Genitourinary: Negative for dysuria.  Musculoskeletal: Negative for back pain and neck pain.  Skin: Negative for rash.  Neurological: Negative for dizziness, light-headedness and headaches.  Hematological: Does not bruise/bleed easily.  Psychiatric/Behavioral: Negative for confusion.   Allergies  Codeine; Demerol; Dilaudid; and Morphine and related  Home Medications   Prior to Admission medications   Medication Sig Start Date End Date Taking? Authorizing Provider  acetaminophen (TYLENOL) 500 MG tablet Take 500 mg by mouth every 6 (six) hours as needed for mild pain. pain   Yes Historical Provider,  MD  aspirin EC 81 MG tablet Take 81 mg by mouth 3 (three) times a week.   Yes Historical Provider, MD  calcium-vitamin D (OSCAL WITH D) 500-200 MG-UNIT per tablet Take 1 tablet by mouth daily.   Yes Historical Provider, MD  ezetimibe (ZETIA) 10 MG tablet Take 10 mg by mouth at bedtime.   Yes Historical Provider, MD  Nebivolol HCl 20 MG TABS Take 20 mg by mouth daily.   Yes Historical Provider, MD  pravastatin (PRAVACHOL) 40 MG tablet Take 40 mg by mouth at bedtime.   Yes Historical Provider, MD  tacrolimus (PROGRAF) 1 MG capsule Take 1 mg by mouth 2 (two) times daily.   Yes Historical Provider, MD  verapamil  (CALAN-SR) 240 MG CR tablet Take 240 mg by mouth daily.   Yes Historical Provider, MD  ketorolac (TORADOL) 10 MG tablet Take 1 tablet (10 mg total) by mouth every 6 (six) hours as needed for pain. Patient not taking: Reported on 04/20/2014 05/19/12   Johnna Acosta, MD  predniSONE (DELTASONE) 10 MG tablet Take q day 6,5,4,3,2,1 Patient not taking: Reported on 04/20/2014 04/29/12   Richarda Blade, MD  predniSONE (DELTASONE) 20 MG tablet Take 2 tablets (40 mg total) by mouth daily. Take 40 mg by mouth daily for 3 days, then 20mg  by mouth daily for 3 days, then 10mg  daily for 3 days Patient not taking: Reported on 04/20/2014 05/19/12   Johnna Acosta, MD   Triage Vitals: BP 184/59 mmHg  Pulse 51  Temp(Src) 98 F (36.7 C) (Oral)  Resp 23  Ht 5' 4.5" (1.638 m)  Wt 200 lb (90.719 kg)  BMI 33.81 kg/m2  SpO2 95% Physical Exam  Constitutional: She is oriented to person, place, and time. She appears well-developed and well-nourished. No distress.  HENT:  Head: Normocephalic and atraumatic.  Mouth/Throat: Oropharynx is clear and moist.  Eyes: Conjunctivae and EOM are normal. Pupils are equal, round, and reactive to light. No scleral icterus.  Neck: Neck supple. No tracheal deviation present.  Cardiovascular: Normal rate, regular rhythm and normal heart sounds.   No murmur heard. Pulmonary/Chest: Effort normal and breath sounds normal. No respiratory distress. She has no wheezes. She has no rales.  Abdominal: Soft. Bowel sounds are normal. She exhibits no distension and no mass. There is no tenderness. There is no rebound and no guarding.  Musculoskeletal: Normal range of motion.  Neurological: She is alert and oriented to person, place, and time.  Skin: Skin is warm and dry.  Psychiatric: She has a normal mood and affect. Her behavior is normal.  Nursing note and vitals reviewed.   ED Course  Procedures (including critical care time) DIAGNOSTIC STUDIES: Oxygen Saturation is 95% on room air,  adequate by my interpretation.    COORDINATION OF CARE: 10:21 PM-Discussed treatment plan which includes CXR, EKG, and lab work with pt at bedside and pt agreed to plan.  Results for orders placed or performed during the hospital encounter of 04/20/14  CBC with Differential  Result Value Ref Range   WBC 13.8 (H) 4.0 - 10.5 K/uL   RBC 4.54 3.87 - 5.11 MIL/uL   Hemoglobin 14.0 12.0 - 15.0 g/dL   HCT 43.2 36.0 - 46.0 %   MCV 95.2 78.0 - 100.0 fL   MCH 30.8 26.0 - 34.0 pg   MCHC 32.4 30.0 - 36.0 g/dL   RDW 14.3 11.5 - 15.5 %   Platelets 312 150 - 400 K/uL   Neutrophils Relative %  66 43 - 77 %   Neutro Abs 9.2 (H) 1.7 - 7.7 K/uL   Lymphocytes Relative 24 12 - 46 %   Lymphs Abs 3.3 0.7 - 4.0 K/uL   Monocytes Relative 9 3 - 12 %   Monocytes Absolute 1.2 (H) 0.1 - 1.0 K/uL   Eosinophils Relative 1 0 - 5 %   Eosinophils Absolute 0.1 0.0 - 0.7 K/uL   Basophils Relative 0 0 - 1 %   Basophils Absolute 0.0 0.0 - 0.1 K/uL  Comprehensive metabolic panel  Result Value Ref Range   Sodium 136 135 - 145 mmol/L   Potassium 4.3 3.5 - 5.1 mmol/L   Chloride 103 96 - 112 mEq/L   CO2 25 19 - 32 mmol/L   Glucose, Bld 128 (H) 70 - 99 mg/dL   BUN 23 6 - 23 mg/dL   Creatinine, Ser 0.88 0.50 - 1.10 mg/dL   Calcium 9.1 8.4 - 10.5 mg/dL   Total Protein 6.9 6.0 - 8.3 g/dL   Albumin 4.3 3.5 - 5.2 g/dL   AST 28 0 - 37 U/L   ALT 33 0 - 35 U/L   Alkaline Phosphatase 69 39 - 117 U/L   Total Bilirubin 0.6 0.3 - 1.2 mg/dL   GFR calc non Af Amer 64 (L) >90 mL/min   GFR calc Af Amer 74 (L) >90 mL/min   Anion gap 8 5 - 15  Troponin I  Result Value Ref Range   Troponin I <0.03 <0.031 ng/mL   Dg Chest 2 View  04/20/2014   CLINICAL DATA:  Chest pain for 1 day ; hypertension.  Former smoker.  EXAM: CHEST  2 VIEW  COMPARISON:  Chest radiograph November 23, 2011  FINDINGS: The cardiac silhouette is mildly enlarged, similar to prior examination. Mediastinal silhouette is nonsuspicious from a mildly calcified aortic  knob. Similarly increased lung volumes can be seen with COPD without superimposed pleural effusions or focal consolidation. No pneumothorax. Soft tissue planes and included osseous structures are nonsuspicious. Moderate to severe degenerative change of thoracic spine.  IMPRESSION: Stable cardiomegaly, no acute pulmonary process.   Electronically Signed   By: Elon Alas   On: 04/20/2014 22:40    Medications - No data to display    EKG Interpretation   Date/Time:  Sunday April 20 2014 21:27:10 EST Ventricular Rate:  62 PR Interval:  159 QRS Duration: 83 QT Interval:  421 QTC Calculation: 427 R Axis:   28 Text Interpretation:  Sinus rhythm Confirmed by Odis Wickey  MD, Tadan Shill  ([id number]) on 04/20/2014 10:26:15 PM      MDM   Final diagnoses:  Chest pain, unspecified chest pain type     Patient with chest pain earlier lasting about 20 minutes and then resolved. Patient's first troponin is negative. Will require repeat troponin second troponin can be done around 12 midnight. Patient's chest x-ray without any acute findings. Patient does have a liver transplant but her liver function test are normal. If the second troponin is negative and there is no new change in her symptoms patient can be discharged home. Patient has follow-up with Dr. Emilee Hero tomorrow.    I personally performed the services described in this documentation, which was scribed in my presence. The recorded information has been reviewed and is accurate.     Linda Sorrow, MD 04/20/14 2301  Patient has remained chest pain-free no pressure in the chest no sensation in the chest at all since arrival.     Linda Sorrow,  MD 04/20/14 2322

## 2014-04-20 NOTE — ED Notes (Signed)
Pt reports not feeling well over the past couple of days,, states she has been feeling weak for a while. Approx 1 hr PTA pt began having chest tightness.

## 2014-04-21 ENCOUNTER — Encounter (HOSPITAL_COMMUNITY): Payer: Self-pay | Admitting: *Deleted

## 2014-04-21 DIAGNOSIS — R001 Bradycardia, unspecified: Secondary | ICD-10-CM | POA: Diagnosis not present

## 2014-04-21 DIAGNOSIS — R072 Precordial pain: Secondary | ICD-10-CM | POA: Diagnosis not present

## 2014-04-21 DIAGNOSIS — E785 Hyperlipidemia, unspecified: Secondary | ICD-10-CM | POA: Diagnosis not present

## 2014-04-21 DIAGNOSIS — R079 Chest pain, unspecified: Secondary | ICD-10-CM | POA: Diagnosis not present

## 2014-04-21 DIAGNOSIS — I10 Essential (primary) hypertension: Secondary | ICD-10-CM | POA: Diagnosis not present

## 2014-04-21 LAB — COMPREHENSIVE METABOLIC PANEL
ALK PHOS: 66 U/L (ref 39–117)
ALT: 30 U/L (ref 0–35)
AST: 23 U/L (ref 0–37)
Albumin: 4.1 g/dL (ref 3.5–5.2)
Anion gap: 5 (ref 5–15)
BUN: 20 mg/dL (ref 6–23)
CALCIUM: 9 mg/dL (ref 8.4–10.5)
CHLORIDE: 103 meq/L (ref 96–112)
CO2: 29 mmol/L (ref 19–32)
CREATININE: 0.9 mg/dL (ref 0.50–1.10)
GFR calc non Af Amer: 62 mL/min — ABNORMAL LOW (ref >90)
GFR, EST AFRICAN AMERICAN: 72 mL/min — AB (ref >90)
GLUCOSE: 96 mg/dL (ref 70–99)
POTASSIUM: 4.2 mmol/L (ref 3.5–5.1)
SODIUM: 137 mmol/L (ref 135–145)
Total Bilirubin: 0.6 mg/dL (ref 0.3–1.2)
Total Protein: 6.6 g/dL (ref 6.0–8.3)

## 2014-04-21 LAB — TROPONIN I
Troponin I: <0.03 ng/mL (ref <0.031)
Troponin I: <0.03 ng/mL (ref <0.031)

## 2014-04-21 LAB — CBC
HCT: 40.5 % (ref 36.0–46.0)
HCT: 40.4 % (ref 36.0–46.0)
HEMOGLOBIN: 12.9 g/dL (ref 12.0–15.0)
Hemoglobin: 13 g/dL (ref 12.0–15.0)
MCH: 30.7 pg (ref 26.0–34.0)
MCH: 30.6 pg (ref 26.0–34.0)
MCHC: 32.1 g/dL (ref 30.0–36.0)
MCHC: 31.9 g/dL (ref 30.0–36.0)
MCV: 95.5 fL (ref 78.0–100.0)
MCV: 96 fL (ref 78.0–100.0)
PLATELETS: 265 10*3/uL (ref 150–400)
Platelets: 271 10*3/uL (ref 150–400)
RBC: 4.24 MIL/uL (ref 3.87–5.11)
RBC: 4.21 MIL/uL (ref 3.87–5.11)
RDW: 14.1 % (ref 11.5–15.5)
RDW: 14.3 % (ref 11.5–15.5)
WBC: 10.7 10*3/uL — ABNORMAL HIGH (ref 4.0–10.5)
WBC: 11.3 10*3/uL — AB (ref 4.0–10.5)

## 2014-04-21 LAB — CREATININE, SERUM
Creatinine, Ser: 0.89 mg/dL (ref 0.50–1.10)
GFR, EST AFRICAN AMERICAN: 73 mL/min — AB (ref >90)
GFR, EST NON AFRICAN AMERICAN: 63 mL/min — AB (ref >90)

## 2014-04-21 MED ORDER — SODIUM CHLORIDE 0.9 % IJ SOLN
3.0000 mL | Freq: Two times a day (BID) | INTRAMUSCULAR | Status: DC
  Administered 2014-04-21 – 2014-04-22 (×2): 3 mL via INTRAVENOUS

## 2014-04-21 MED ORDER — SODIUM CHLORIDE 0.9 % IJ SOLN
3.0000 mL | Freq: Two times a day (BID) | INTRAMUSCULAR | Status: DC
  Administered 2014-04-21: 3 mL via INTRAVENOUS

## 2014-04-21 MED ORDER — SODIUM CHLORIDE 0.9 % IJ SOLN: 3.0000 mL | INTRAMUSCULAR | Status: DC | PRN

## 2014-04-21 MED ORDER — ONDANSETRON HCL 4 MG/2ML IJ SOLN: 4.0000 mg | Freq: Four times a day (QID) | INTRAMUSCULAR | Status: DC | PRN

## 2014-04-21 MED ORDER — PRAVASTATIN SODIUM 40 MG PO TABS
40.0000 mg | ORAL_TABLET | Freq: Every day | ORAL | Status: DC
  Administered 2014-04-21: 40 mg via ORAL

## 2014-04-21 MED ORDER — ENOXAPARIN SODIUM 40 MG/0.4ML ~~LOC~~ SOLN
40.0000 mg | SUBCUTANEOUS | Status: DC
  Filled 2014-04-21: qty 0.4

## 2014-04-21 MED ORDER — ONDANSETRON HCL 4 MG PO TABS: 4.0000 mg | ORAL_TABLET | Freq: Four times a day (QID) | ORAL | Status: DC | PRN

## 2014-04-21 MED ORDER — TACROLIMUS 1 MG PO CAPS
1.0000 mg | ORAL_CAPSULE | Freq: Two times a day (BID) | ORAL | Status: DC
  Administered 2014-04-21 – 2014-04-22 (×2): 1 mg via ORAL
  Filled 2014-04-21 (×6): qty 1

## 2014-04-21 MED ORDER — ASPIRIN EC 81 MG PO TBEC
81.0000 mg | DELAYED_RELEASE_TABLET | ORAL | Status: DC
  Administered 2014-04-21: 81 mg via ORAL
  Filled 2014-04-21: qty 1

## 2014-04-21 MED ORDER — EZETIMIBE 10 MG PO TABS
10.0000 mg | ORAL_TABLET | Freq: Every day | ORAL | Status: DC
  Administered 2014-04-21: 10 mg via ORAL

## 2014-04-21 MED ORDER — ACETAMINOPHEN 500 MG PO TABS
500.0000 mg | ORAL_TABLET | Freq: Four times a day (QID) | ORAL | Status: DC | PRN
  Administered 2014-04-21 – 2014-04-22 (×4): 500 mg via ORAL
  Filled 2014-04-21 (×4): qty 1

## 2014-04-21 MED ORDER — VERAPAMIL HCL ER 240 MG PO TBCR
240.0000 mg | EXTENDED_RELEASE_TABLET | Freq: Every day | ORAL | Status: DC
  Administered 2014-04-22: 240 mg via ORAL
  Filled 2014-04-21 (×2): qty 1

## 2014-04-21 MED ORDER — SODIUM CHLORIDE 0.9 % IV SOLN: 250.0000 mL | INTRAVENOUS | Status: DC | PRN

## 2014-04-21 NOTE — ED Notes (Signed)
Patient has orthostatic vitals. Patient in restroom at this time.

## 2014-04-21 NOTE — Progress Notes (Signed)
Dr. Gwen Her notified due to the patient stating that they were suppose to have an "u/s" done today or the am and they had not seen anyone as of yet.  MD stated that the 2D echo did not have to be done as an inpatient that it could be done as an outpatient.  He went on to say that the patient could be discharged today since her symptoms seemed to be more related to medication that she was previously taking.  I notified Dr. Warren Lacy of what Dr. Gwen Her said and he said that the patient will most likely have the test done in the am.

## 2014-04-21 NOTE — Progress Notes (Signed)
UR completed 

## 2014-04-21 NOTE — ED Provider Notes (Signed)
Pt received at sign out. 74yo F, c/o mid-sternal chest "tightness" that occurred approximately 2000 PTA. Pt states she was sitting in a chair when the CP started. Pt walked to her bedroom and back to the chair, when she then told her family she needed to go to the ED for evaluation. Was associated with SOB and nausea. Lasted 20 minutes before spontaneously resolving.  Pt states she has been feeling generally "weak" and "tired" after starting bystolic 2 days ago during her PMD office visit (was told her "BP was high").  Discomfort has not returned while pt has been in the ED. EKG and troponin without acute abnl. Multiple ACS risk factors. Admit to medicine service.   Francine Graven, DO 04/21/14 [phone]

## 2014-04-21 NOTE — Progress Notes (Signed)
Subjective: The patient is feeling some better today. She was admitted with anterior chest discomfort. She had been hypertensive was started on medication for blood pressure was admitted through ED found to be bradycardic with heart rate 47 and 49 and was admitted for observation  Objective: Vital signs in last 24 hours: Temp:  [97.8 F (36.6 C)-98 F (36.7 C)] 97.8 F (36.6 C) (01/18 0204) Pulse Rate:  [47-66] 63 (01/18 0204) Resp:  [16-23] 20 (01/18 0204) BP: (165-184)/(59-88) 165/88 mmHg (01/18 0204) SpO2:  [94 %-100 %] 100 % (01/18 0204) Weight:  [90.719 kg (200 lb)-96.1 kg (211 lb 13.8 oz)] 96.1 kg (211 lb 13.8 oz) (01/18 0204) Weight change:     Intake/Output from previous day:   Intake/Output this shift:    Physical Exam: The patient is alert and oriented not having chest pain at the moment  Blood pressure 165/88 with pulse rate 63  HEENT negative  Neck supple no JVD or thyroid abnormalities  Heart regular rhythm no murmurs  Lungs clear to P&A  Abdomen no palpable organs or masses   Recent Labs  04/20/14 2135 04/21/14 0206  WBC 13.8* 10.7*  HGB 14.0 13.0  HCT 43.2 40.5  PLT 312 271   BMET  Recent Labs  04/20/14 2135 04/21/14 0206  NA 136  --   K 4.3  --   CL 103  --   CO2 25  --   GLUCOSE 128*  --   BUN 23  --   CREATININE 0.88 0.89  CALCIUM 9.1  --     Studies/Results: Dg Chest 2 View  04/20/2014   CLINICAL DATA:  Chest pain for 1 day ; hypertension.  Former smoker.  EXAM: CHEST  2 VIEW  COMPARISON:  Chest radiograph November 23, 2011  FINDINGS: The cardiac silhouette is mildly enlarged, similar to prior examination. Mediastinal silhouette is nonsuspicious from a mildly calcified aortic knob. Similarly increased lung volumes can be seen with COPD without superimposed pleural effusions or focal consolidation. No pneumothorax. Soft tissue planes and included osseous structures are nonsuspicious. Moderate to severe degenerative change of thoracic  spine.  IMPRESSION: Stable cardiomegaly, no acute pulmonary process.   Electronically Signed   By: Elon Alas   On: 04/20/2014 22:40    Medications:  . aspirin EC  81 mg Oral Once per day on Mon Wed Fri  . enoxaparin (LOVENOX) injection  40 mg Subcutaneous Q24H  . ezetimibe  10 mg Oral QHS  . pravastatin  40 mg Oral QHS  . sodium chloride  3 mL Intravenous Q12H  . sodium chloride  3 mL Intravenous Q12H  . tacrolimus  1 mg Oral BID  . verapamil  240 mg Oral Daily        Assessment/Plan: 1. Chest pain etiology undetermined-continue to cycle cardiac enzymes-obtain cardiology consult  2. Hypertension 2 continue verapamil  3. Recent sinus bradycardia continue to monitor-cardiology consult   LOS: 1 day   Heidie Krall G 04/21/2014, 6:25 AM

## 2014-04-21 NOTE — H&P (Signed)
PCP:   Lanette Hampshire, MD   Chief Complaint:  Chest pain  HPI: 74 year old female who   has a past medical history of Hypertension; liver transplant; Chronic back pain; Palpitations; Kidney stones; and Hyperlipidemia. Today came to the ED with chief complaint of chest pain which started around 8 PM tonight. Patient was recently seen by her PCP Dr. Emilee Hero and started on by stolid 20 mg by mouth daily. Patient says that since she started taking that medication she has been feeling generalized weakness area did she was also found to be bradycardic with heart rate 47-49 in the ED. She denies passing out, denies shortness of breath no nausea vomiting. She admits to having diarrhea for last 3-4 days. Patient has a history of liver transplant, hyperlipidemia.  Allergies:   Allergies  Allergen Reactions  . Codeine Other (See Comments)    Hallucinate, vomiting and sweating  . Demerol [Meperidine] Other (See Comments)    Hallucinations, vomiting and sweating.   . Dilaudid [Hydromorphone Hcl] Other (See Comments)    Hallucinations, sweating and vomiting  . Morphine And Related Other (See Comments)    Hallucinations, vomiting and sweating      Past Medical History  Diagnosis Date  . Hypertension   . Hx of liver transplant     1994, 1995 at [facility]  . Chronic back pain   . Palpitations   . Kidney stones   . Hyperlipidemia     Past Surgical History  Procedure Laterality Date  . Liver transplant    . Abdominal hysterectomy    . Back surgery    . Appendectomy      Prior to Admission medications   Medication Sig Start Date End Date Taking? Authorizing Provider  acetaminophen (TYLENOL) 500 MG tablet Take 500 mg by mouth every 6 (six) hours as needed for mild pain. pain   Yes Historical Provider, MD  aspirin EC 81 MG tablet Take 81 mg by mouth 3 (three) times a week.   Yes Historical Provider, MD  calcium-vitamin D (OSCAL WITH D) 500-200 MG-UNIT per tablet Take 1 tablet by mouth  daily.   Yes Historical Provider, MD  ezetimibe (ZETIA) 10 MG tablet Take 10 mg by mouth at bedtime.   Yes Historical Provider, MD  Nebivolol HCl 20 MG TABS Take 20 mg by mouth daily.   Yes Historical Provider, MD  pravastatin (PRAVACHOL) 40 MG tablet Take 40 mg by mouth at bedtime.   Yes Historical Provider, MD  tacrolimus (PROGRAF) 1 MG capsule Take 1 mg by mouth 2 (two) times daily.   Yes Historical Provider, MD  verapamil (CALAN-SR) 240 MG CR tablet Take 240 mg by mouth daily.   Yes Historical Provider, MD  ketorolac (TORADOL) 10 MG tablet Take 1 tablet (10 mg total) by mouth every 6 (six) hours as needed for pain. Patient not taking: Reported on 04/20/2014 05/19/12   Johnna Acosta, MD  predniSONE (DELTASONE) 10 MG tablet Take q day 6,5,4,3,2,1 Patient not taking: Reported on 04/20/2014 04/29/12   Richarda Blade, MD  predniSONE (DELTASONE) 20 MG tablet Take 2 tablets (40 mg total) by mouth daily. Take 40 mg by mouth daily for 3 days, then 20mg  by mouth daily for 3 days, then 10mg  daily for 3 days Patient not taking: Reported on 04/20/2014 05/19/12   Johnna Acosta, MD    Social History:  reports that she quit smoking about 23 years ago. Her smoking use included Cigarettes. She smoked 1.00 pack per  day. She has never used smokeless tobacco. She reports that she does not drink alcohol or use illicit drugs.  Family History  Problem Relation Age of Onset  . Stroke Mother   . Stroke Other      All the positives are listed in BOLD  Review of Systems:  HEENT: Headache, blurred vision, runny nose, sore throat Neck: Hypothyroidism, hyperthyroidism,,lymphadenopathy Chest : Shortness of breath, history of COPD, Asthma Heart : Chest pain, history of coronary arterey disease GI:  Nausea, vomiting, diarrhea, constipation, GERD GU: Dysuria, urgency, frequency of urination, hematuria Neuro: Stroke, seizures, syncope Psych: Depression, anxiety, hallucinations   Physical Exam: Blood pressure  184/59, pulse 49, temperature 98 F (36.7 C), temperature source Oral, resp. rate 23, height 5' 4.5" (1.638 m), weight 90.719 kg (200 lb), SpO2 94 %. Constitutional:   Patient is a well-developed and well-nourished female in no acute distress and cooperative with exam. Head: Normocephalic and atraumatic Mouth: Mucus membranes moist Eyes: PERRL, EOMI, conjunctivae normal Neck: Supple, No Thyromegaly Cardiovascular: RRR, S1 normal, S2 normal Pulmonary/Chest: CTAB, no wheezes, rales, or rhonchi Abdominal: Soft. Non-tender, non-distended, bowel sounds are normal, no masses, organomegaly, or guarding present.  Neurological: A&O x3, Strength is normal and symmetric bilaterally, cranial nerve II-XII are grossly intact, no focal motor deficit, sensory intact to light touch bilaterally.  Extremities : No Cyanosis, Clubbing or Edema  Labs on Admission:  Basic Metabolic Panel:  Recent Labs Lab 04/20/14 2135  NA 136  K 4.3  CL 103  CO2 25  GLUCOSE 128*  BUN 23  CREATININE 0.88  CALCIUM 9.1   Liver Function Tests:  Recent Labs Lab 04/20/14 2135  AST 28  ALT 33  ALKPHOS 69  BILITOT 0.6  PROT 6.9  ALBUMIN 4.3   No results for input(s): LIPASE, AMYLASE in the last 168 hours. No results for input(s): AMMONIA in the last 168 hours. CBC:  Recent Labs Lab 04/20/14 2135  WBC 13.8*  NEUTROABS 9.2*  HGB 14.0  HCT 43.2  MCV 95.2  PLT 312   Cardiac Enzymes:  Recent Labs Lab 04/20/14 2135  TROPONINI <0.03    BNP (last 3 results) No results for input(s): PROBNP in the last 8760 hours. CBG: No results for input(s): GLUCAP in the last 168 hours.  Radiological Exams on Admission: Dg Chest 2 View  04/20/2014   CLINICAL DATA:  Chest pain for 1 day ; hypertension.  Former smoker.  EXAM: CHEST  2 VIEW  COMPARISON:  Chest radiograph November 23, 2011  FINDINGS: The cardiac silhouette is mildly enlarged, similar to prior examination. Mediastinal silhouette is nonsuspicious from a  mildly calcified aortic knob. Similarly increased lung volumes can be seen with COPD without superimposed pleural effusions or focal consolidation. No pneumothorax. Soft tissue planes and included osseous structures are nonsuspicious. Moderate to severe degenerative change of thoracic spine.  IMPRESSION: Stable cardiomegaly, no acute pulmonary process.   Electronically Signed   By: Elon Alas   On: 04/20/2014 22:40    EKG: Independently reviewed. Normal sinus rhythm   Assessment/Plan Active Problems:   Chest pain   HTN (hypertension)  Chest pain We'll admit the patient under observation in telemetry. And obtain serial cardia enzymes. Cardiac enzymes in the ED are negative so far. EKG shows normal sinus rhythm with no ST-T changes.  Hypertension Will continue verapamil and I will hold bystolic at this time  Bradycardia Hold Bystolic and monitor on telemetry  DVT prophylaxis Lovenox  Code status: Patient is full code  Family discussion: Admission, patients condition and plan of care including tests being ordered have been discussed with the patient and *her daughter and mother at bedside* who indicate understanding and agree with the plan and Code Status.   Time Spent on Admission: 60 minutes  [facility] Pager: [phone] 04/21/2014, 12:55 AM  If 7PM-7AM, please contact night-coverage  [email or web address]  Password [email or web address]

## 2014-04-21 NOTE — Consult Note (Signed)
CARDIOLOGY CONSULT NOTE   Patient ID: Linda Barr MRN: [id number] DOB/AGE: 1941/02/07 74 y.o.  Admit Date: 04/20/2014 Referring Physician: Marjean Donna MD Primary Physician: Lanette Hampshire, MD Consulting Cardiologist: Rozann Lesches MD Primary Cardiologist: None - Saw Rothbart 16 yrs ago Reason for Consultation: Chest Pain and bradycardia  Clinical Summary Linda Barr is a 74 y.o.female with remote history of palpitations who was evaluated in the past by cardiology for pre-operative evaluation prior to liver transplant in 1994,(follwed in [facility] by Dr. Dory Horn on anti-rejection mediation-prograft), hypertension, and hypercholesterolemia, admitted with chest pain. She states that she had diarrhea all last week, and also a chest cold. Saw Dr. Everette Rank in the office on Friday for flu shot and was found to be hypertensive. She states she is normally hypertensive when she is anxious about her illness. She was given Bystolic 20 mg in the office and asked to wait for a while for BP to come down, and was given a Rx for same. She took it for two days. On Sunday she began to feel very weak, legs could not hold her, and foggy headed. She began to have chest "tightness, not pain". No diaphoresis, dizziness, or dyspnea.     On arrival to ER,  BP 184/59, HR 61 bpm, O2 Sat 98%. Troponin negative x 1. Some leukocytosis, 13.8, Glucose 128. CXR stable cardiomegaly, no pulmonary process. EKG with NSR 62 bpm. She was admitted to rule out cardiac etiology of chest pain. Subsequent troponin negative X 4. She is without complaint currently, and has had bystolic discontinued.      Allergies  Allergen Reactions  . Codeine Other (See Comments)    Hallucinate, vomiting and sweating  . Demerol [Meperidine] Other (See Comments)    Hallucinations, vomiting and sweating.   . Dilaudid [Hydromorphone Hcl] Other (See Comments)    Hallucinations, sweating and vomiting  . Morphine And Related Other (See Comments)     Hallucinations, vomiting and sweating    Medications Scheduled Medications: . aspirin EC  81 mg Oral Once per day on Mon Wed Fri  . enoxaparin (LOVENOX) injection  40 mg Subcutaneous Q24H  . ezetimibe  10 mg Oral QHS  . pravastatin  40 mg Oral QHS  . sodium chloride  3 mL Intravenous Q12H  . sodium chloride  3 mL Intravenous Q12H  . tacrolimus  1 mg Oral BID  . verapamil  240 mg Oral Daily    PRN Medications: sodium chloride, acetaminophen, ondansetron **OR** ondansetron (ZOFRAN) IV, sodium chloride   Past Medical History  Diagnosis Date  . Hypertension   . Hx of liver transplant     19 94, 1995 at [facility]  . Chronic back pain   . Palpitations   . Kidney stones   . Hyperlipidemia     Past Surgical History  Procedure Laterality Date  . Liver transplant    . Abdominal hysterectomy    . Back surgery    . Appendectomy      Family History  Problem Relation Age of Onset  . Stroke Mother   . Stroke Other     Social History Linda Barr reports that she quit smoking about 23 years ago. Her smoking use included Cigarettes. She smoked 1.00 pack per day. She has never used smokeless tobacco. Linda Barr reports that she does not drink alcohol.  Review of Systems Complete review of systems are found to be negative unless outlined in H&P above.  Physical Examination Blood pressure 170/76, pulse 62,  temperature 98.2 F (36.8 C), temperature source Oral, resp. rate 20, height 5' 4.5" (1.638 m), weight 211 lb 13.8 oz (96.1 kg), SpO2 96 %.  Intake/Output Summary (Last 24 hours) at 04/21/14 1146 Last data filed at 04/21/14 2993  Gross per 24 hour  Intake    240 ml  Output      0 ml  Net    240 ml    Telemetry: NSR  GEN: No acute distress HEENT: Conjunctiva and lids normal, oropharynx clear with moist mucosa. Neck: Supple, no elevated JVP or carotid bruits, no thyromegaly. Lungs: Clear to auscultation, nonlabored breathing at rest. Cardiac: Regular rate and rhythm, no S3  or significant systolic murmur, no pericardial rub. Abdomen: Soft, nontender, no hepatomegaly, bowel sounds present, no guarding or rebound. Extremities: No pitting edema, distal pulses 2+. Skin: Warm and dry. Musculoskeletal: No kyphosis. Neuropsychiatric: Alert and oriented x3, affect grossly appropriate.  Prior Cardiac Testing/Procedures Remote Holter and Stress test 18 years ago.  Lab Results  Basic Metabolic Panel:  Recent Labs Lab 04/20/14 2135 04/21/14 0206 04/21/14 0723  NA 136  --  137  K 4.3  --  4.2  CL 103  --  103  CO2 25  --  29  GLUCOSE 128*  --  96  BUN 23  --  20  CREATININE 0.88 0.89 0.90  CALCIUM 9.1  --  9.0    Liver Function Tests:  Recent Labs Lab 04/20/14 2135 04/21/14 0723  AST 28 23  ALT 33 30  ALKPHOS 69 66  BILITOT 0.6 0.6  PROT 6.9 6.6  ALBUMIN 4.3 4.1    CBC:  Recent Labs Lab 04/20/14 2135 04/21/14 0206 04/21/14 0723  WBC 13.8* 10.7* 11.3*  NEUTROABS 9.2*  --   --   HGB 14.0 13.0 12.9  HCT 43.2 40.5 40.4  MCV 95.2 95.5 96.0  PLT 312 271 265    Cardiac Enzymes:  Recent Labs Lab 04/20/14 2135 04/21/14 0001 04/21/14 0206 04/21/14 0723  TROPONINI <0.03 <0.03 <0.03 <0.03    Radiology: Dg Chest 2 View  04/20/2014   CLINICAL DATA:  Chest pain for 1 day ; hypertension.  Former smoker.  EXAM: CHEST  2 VIEW  COMPARISON:  Chest radiograph November 23, 2011  FINDINGS: The cardiac silhouette is mildly enlarged, similar to prior examination. Mediastinal silhouette is nonsuspicious from a mildly calcified aortic knob. Similarly increased lung volumes can be seen with COPD without superimposed pleural effusions or focal consolidation. No pneumothorax. Soft tissue planes and included osseous structures are nonsuspicious. Moderate to severe degenerative change of thoracic spine.  IMPRESSION: Stable cardiomegaly, no acute pulmonary process.   Electronically Signed   By: Elon Alas   On: 04/20/2014 22:40    ECG: NSR, rate of 62  bpm.    Impression and Recommendations  1.Chest Pain: Unclear etiology, but likely due to bradycardia on new medication, Bystolic. Had not had these episodes before until beginning the new medication. Feels better now that the medication has been stopped. Troponin and EKG are normal arguing against ACS. CVRF include: hypertension, hypercholesterolemia, age.  2. Hypertension: Currently elevated on verapamil 240 mg tablet, this am. Would not start back Bystolic as she clearly was unable to tolerate this. Can consider adding low dose ACE inhibitor or ARB and monitor her response. Would avoid any AV nodal blocking agents. Echo will be ordered for LV fx with history of longstanding hypertension. I have reviewed Care Everywhere for evidence of past echo to compare. There  was no report found.  3.Hypercholesterolemia: On Zetia and pravastatin. Check fasting lipids if she remains in the hospital.   4. Hx of Liver Transplant: On Prograft. Followed annually by [facility], Dr. Dory Horn. Last seen in July of 2015.    Signed: Phill Myron. Lawrence NP [place]  04/21/2014, 11:46 AM Co-Sign MD   Attending note:  Patient seen and examined. Reviewed records and modified above note by Ms. Lawrence NP. Ms. Dezeeuw presents with weakness and chest discomfort, associated temporally with recent start of Bystolic for hypertension on Friday. Baseline medications include verapamil. She was noted to be bradycardic with heart rates in the 40s to 60s at presentation. Subsequently, Bystolic has been stopped, telemetry shows no significant pauses or prolonged bradycardia. Cardiac markers are normal arguing against ACS. No acute ST segment changes by ECG. On examination she is comfortable, in bedside chair. Lungs are clear and nonlabored, cardiac exam reveals RRR without gallop. Pertinent history includes prior liver transplantation at [facility], she has done very well over the last 15-20 years. Echocardiogram is been  ordered to evaluate cardiac structure and function with longstanding history of hypertension, might also help to guide further treatment options. Would discontinue Bystolic altogether. Can probably stay on verapamil, and consider the addition of an ACE inhibitor or ARB if additional blood pressure control is needed. Unless her echocardiogram demonstrates any substantial abnormalities, she can likely be discharged from a cardiac perspective later today and follow-up with Dr. Everette Rank.  Satira Sark, M.D., F.A.C.C.

## 2014-04-22 DIAGNOSIS — E785 Hyperlipidemia, unspecified: Secondary | ICD-10-CM | POA: Diagnosis not present

## 2014-04-22 DIAGNOSIS — R072 Precordial pain: Secondary | ICD-10-CM | POA: Diagnosis not present

## 2014-04-22 DIAGNOSIS — I517 Cardiomegaly: Secondary | ICD-10-CM | POA: Diagnosis not present

## 2014-04-22 DIAGNOSIS — R001 Bradycardia, unspecified: Secondary | ICD-10-CM | POA: Diagnosis not present

## 2014-04-22 DIAGNOSIS — I10 Essential (primary) hypertension: Secondary | ICD-10-CM | POA: Diagnosis not present

## 2014-04-22 DIAGNOSIS — R079 Chest pain, unspecified: Secondary | ICD-10-CM | POA: Diagnosis not present

## 2014-04-22 NOTE — Care Management Note (Signed)
    Page 1 of 1   04/22/2014     11:58:56 AM CARE MANAGEMENT NOTE 04/22/2014  Patient:  Linda Barr, Linda Barr   Account Number:  [id number]  Date Initiated:  04/22/2014  Documentation initiated by:  Theophilus Kinds  Subjective/Objective Assessment:   Pt admitted from home with CP. Pt lives with family and will return home at discharge. Pt is fairly independent with ADL's. Pt uses a cane for prn use.     Action/Plan:   Anticipate discharge today. No CM needs noted.   Anticipated DC Date:  04/22/2014   Anticipated DC Plan:  [place]  CM consult      Choice offered to / List presented to:             Status of service:  Completed, signed off Medicare Important Message given?   (If response is "NO", the following Medicare IM given date fields will be blank) Date Medicare IM given:   Medicare IM given by:   Date Additional Medicare IM given:   Additional Medicare IM given by:    Discharge Disposition:  HOME/SELF CARE  Per UR Regulation:    If discussed at Long Length of Stay Meetings, dates discussed:    Comments:  04/22/14 [place], RN BSN CM

## 2014-04-22 NOTE — Progress Notes (Signed)
UR completed 

## 2014-04-22 NOTE — Progress Notes (Signed)
  Echocardiogram 2D Echocardiogram has been performed.  [place], [place] 04/22/2014, 10:57 AM

## 2014-04-22 NOTE — Progress Notes (Signed)
    Consulting cardiologist: Dr. Satira Sark  Seen for followup: Chest discomfort and bradycardia  Subjective:    Patient feels at baseline this morning. Main complaint is of chronic back pain, difficulty sleeping in her bed last night.  Objective:   Temp:  [98 F (36.7 C)-98.4 F (36.9 C)] 98 F (36.7 C) (01/19 0536) Pulse Rate:  [61-71] 71 (01/19 0536) Resp:  [18-20] 18 (01/19 0536) BP: (131-167)/(57-80) 167/78 mmHg (01/19 0536) SpO2:  [95 %] 95 % (01/19 0536) Weight:  [211 lb 6.7 oz (95.9 kg)] 211 lb 6.7 oz (95.9 kg) (01/19 0536) Last BM Date: 04/21/14  Filed Weights   04/20/14 2117 04/21/14 0204 04/22/14 0536  Weight: 200 lb (90.719 kg) 211 lb 13.8 oz (96.1 kg) 211 lb 6.7 oz (95.9 kg)    Intake/Output Summary (Last 24 hours) at 04/22/14 0945 Last data filed at 04/22/14 0927  Gross per 24 hour  Intake    720 ml  Output      0 ml  Net    720 ml    Telemetry: Sinus rhythm without significant bradycardia or pauses.  Exam:  General: Appears comfortable at rest.  Lungs: Clear, unlabored.  Cardiac: RRR, no gallop.  Extremities: No edema.   Lab Results:  Basic Metabolic Panel:  Recent Labs Lab 04/20/14 2135 04/21/14 0206 04/21/14 0723  NA 136  --  137  K 4.3  --  4.2  CL 103  --  103  CO2 25  --  29  GLUCOSE 128*  --  96  BUN 23  --  20  CREATININE 0.88 0.89 0.90  CALCIUM 9.1  --  9.0    CBC:  Recent Labs Lab 04/20/14 2135 04/21/14 0206 04/21/14 0723  WBC 13.8* 10.7* 11.3*  HGB 14.0 13.0 12.9  HCT 43.2 40.5 40.4  MCV 95.2 95.5 96.0  PLT 312 271 265    Cardiac Enzymes:  Recent Labs Lab 04/21/14 0206 04/21/14 0723 04/21/14 1340  TROPONINI <0.03 <0.03 <0.03     Medications:   Scheduled Medications: . aspirin EC  81 mg Oral Once per day on Mon Wed Fri  . enoxaparin (LOVENOX) injection  40 mg Subcutaneous Q24H  . ezetimibe  10 mg Oral QHS  . pravastatin  40 mg Oral QHS  . sodium chloride  3 mL Intravenous Q12H  . sodium  chloride  3 mL Intravenous Q12H  . tacrolimus  1 mg Oral BID  . verapamil  240 mg Oral Daily      PRN Medications:  sodium chloride, acetaminophen, ondansetron **OR** ondansetron (ZOFRAN) IV, sodium chloride   Assessment:   1. Chest discomfort and bradycardia, resolved with discontinuation of Bystolic. Cardiac markers argue against ACS, ECG without significant ST segment changes.  2. Essential hypertension. Patient has been on verapamil long-term. Blood pressure elevated today. Consider addition of ACE inhibitor or ARB for further management of hypertension if needed. These medications should not contribute to precipitating further bradycardia.  3. History of liver transplantation, on Prograf. She is followed at [facility].   Plan/Discussion:    Echocardiogram pending for today. We will review the results. Doubt that any additional inpatient cardiac testing will be necessary. Anticipate discharge home today per Dr. Everette Rank, and ongoing follow-up with him to continue.   Satira Sark, M.D., F.A.C.C.

## 2014-04-22 NOTE — Discharge Summary (Signed)
Physician Discharge Summary  Linda Barr OZH:[id number] DOB: 1940/09/07 DOA: 04/20/2014  PCP: Lanette Hampshire, MD  Admit date: 04/20/2014 Discharge date: 04/22/2014     Discharge Diagnoses:  1. Chest pain atypical 2. Sinus bradycardia 3. Essential hypertension hypotension on admission 4. Dyslipidemia 5. Liver transplant  Discharge Condition: Stable Disposition: Home  Diet recommendation: Regular  Filed Weights   04/20/14 2117 04/21/14 0204 04/22/14 0536  Weight: 90.719 kg (200 lb) 96.1 kg (211 lb 13.8 oz) 95.9 kg (211 lb 6.7 oz)    History of present illness:  The patient was admitted through the ED after having presented there with chief complaint of chest pain. The patient had been seen previously in primary care physician's office. It was noted at that time and she had elevated blood pressure. She was started on new blood pressure medication. She started feeling generalized weakness following new medication and was found to be bradycardic with a heart rate of 4749 in ED. She did have generalized weakness and episodes of diarrhea prior to admission. It was felt that she should be admitted for further evaluation.  Hospital Course:  Examination on admission revealed alert patient with blood pressure 184/59 pulse rate 49 temp 98 Heart regular rhythm lungs clear to P&A. The patient was started on telemetry. She did have cardiac enzymes monitor these remain negative EKG showed normal sinus rhythm and no ST or T-wave changes. Her blood pressure was monitored and she did not have further chest pain. X-ray of chest was negative. The patient was seen in consultation by cardiology and it was felt that she did not have coronary syndrome. He was also felt that she should not take beta blocker. She was continued on medications listed below. Blood pressure medication will be verapamil 240 mg daily. It was felt she could be discharged.  Discharge Instructions The patient is to continue medications  listed below and return to prior care physician's office in 2 days for follow-up visit. She is to call the office for appointment.    Medication List    STOP taking these medications        predniSONE 10 MG tablet  Commonly known as:  DELTASONE     predniSONE 20 MG tablet  Commonly known as:  DELTASONE      TAKE these medications        acetaminophen 500 MG tablet  Commonly known as:  TYLENOL  Take 500 mg by mouth every 6 (six) hours as needed for mild pain. pain     aspirin EC 81 MG tablet  Take 81 mg by mouth 3 (three) times a week.     calcium-vitamin D 500-200 MG-UNIT per tablet  Commonly known as:  OSCAL WITH D  Take 1 tablet by mouth daily.     ezetimibe 10 MG tablet  Commonly known as:  ZETIA  Take 10 mg by mouth at bedtime.     ketorolac 10 MG tablet  Commonly known as:  TORADOL  Take 1 tablet (10 mg total) by mouth every 6 (six) hours as needed for pain.     Nebivolol HCl 20 MG Tabs  Take 20 mg by mouth daily.     pravastatin 40 MG tablet  Commonly known as:  PRAVACHOL  Take 40 mg by mouth at bedtime.     tacrolimus 1 MG capsule  Commonly known as:  PROGRAF  Take 1 mg by mouth 2 (two) times daily.     verapamil 240 MG CR tablet  Commonly  known as:  CALAN-SR  Take 240 mg by mouth daily.       Allergies  Allergen Reactions  . Codeine Other (See Comments)    Hallucinate, vomiting and sweating  . Demerol [Meperidine] Other (See Comments)    Hallucinations, vomiting and sweating.   . Dilaudid [Hydromorphone Hcl] Other (See Comments)    Hallucinations, sweating and vomiting  . Morphine And Related Other (See Comments)    Hallucinations, vomiting and sweating    The results of significant diagnostics from this hospitalization (including imaging, microbiology, ancillary and laboratory) are listed below for reference.    Significant Diagnostic Studies: Dg Chest 2 View  04/20/2014   CLINICAL DATA:  Chest pain for 1 day ; hypertension.  Former smoker.   EXAM: CHEST  2 VIEW  COMPARISON:  Chest radiograph November 23, 2011  FINDINGS: The cardiac silhouette is mildly enlarged, similar to prior examination. Mediastinal silhouette is nonsuspicious from a mildly calcified aortic knob. Similarly increased lung volumes can be seen with COPD without superimposed pleural effusions or focal consolidation. No pneumothorax. Soft tissue planes and included osseous structures are nonsuspicious. Moderate to severe degenerative change of thoracic spine.  IMPRESSION: Stable cardiomegaly, no acute pulmonary process.   Electronically Signed   By: Elon Alas   On: 04/20/2014 22:40    Microbiology: No results found for this or any previous visit (from the past 240 hour(s)).   Labs: Basic Metabolic Panel:  Recent Labs Lab 04/20/14 2135 04/21/14 0206 04/21/14 0723  NA 136  --  137  K 4.3  --  4.2  CL 103  --  103  CO2 25  --  29  GLUCOSE 128*  --  96  BUN 23  --  20  CREATININE 0.88 0.89 0.90  CALCIUM 9.1  --  9.0   Liver Function Tests:  Recent Labs Lab 04/20/14 2135 04/21/14 0723  AST 28 23  ALT 33 30  ALKPHOS 69 66  BILITOT 0.6 0.6  PROT 6.9 6.6  ALBUMIN 4.3 4.1   No results for input(s): LIPASE, AMYLASE in the last 168 hours. No results for input(s): AMMONIA in the last 168 hours. CBC:  Recent Labs Lab 04/20/14 2135 04/21/14 0206 04/21/14 0723  WBC 13.8* 10.7* 11.3*  NEUTROABS 9.2*  --   --   HGB 14.0 13.0 12.9  HCT 43.2 40.5 40.4  MCV 95.2 95.5 96.0  PLT 312 271 265   Cardiac Enzymes:  Recent Labs Lab 04/20/14 2135 04/21/14 0001 04/21/14 0206 04/21/14 0723 04/21/14 1340  TROPONINI <0.03 <0.03 <0.03 <0.03 <0.03   BNP: BNP (last 3 results) No results for input(s): PROBNP in the last 8760 hours. CBG: No results for input(s): GLUCAP in the last 168 hours.  Active Problems:   Chest pain   HTN (hypertension)   Time coordinating discharge: 45 minutes  Signed:  Marjean Donna, MD 04/22/2014, 5:32 PM

## 2014-04-22 NOTE — Progress Notes (Signed)
Patient with orders to be discharge home. Discharge instructions given, patient verbalized understanding. Patient stable. Patient left in private vehicle with family.  

## 2014-05-02 DIAGNOSIS — K769 Liver disease, unspecified: Secondary | ICD-10-CM | POA: Diagnosis not present

## 2014-05-02 DIAGNOSIS — Z944 Liver transplant status: Secondary | ICD-10-CM | POA: Diagnosis not present

## 2014-05-02 DIAGNOSIS — Z79899 Other long term (current) drug therapy: Secondary | ICD-10-CM | POA: Diagnosis not present

## 2014-05-05 DIAGNOSIS — I10 Essential (primary) hypertension: Secondary | ICD-10-CM | POA: Diagnosis not present

## 2014-05-05 DIAGNOSIS — E785 Hyperlipidemia, unspecified: Secondary | ICD-10-CM | POA: Diagnosis not present

## 2014-05-30 DIAGNOSIS — I10 Essential (primary) hypertension: Secondary | ICD-10-CM | POA: Diagnosis not present

## 2014-05-30 DIAGNOSIS — J309 Allergic rhinitis, unspecified: Secondary | ICD-10-CM | POA: Diagnosis not present

## 2014-06-02 DIAGNOSIS — Z944 Liver transplant status: Secondary | ICD-10-CM | POA: Diagnosis not present

## 2014-06-02 DIAGNOSIS — Z79899 Other long term (current) drug therapy: Secondary | ICD-10-CM | POA: Diagnosis not present

## 2014-06-02 DIAGNOSIS — K769 Liver disease, unspecified: Secondary | ICD-10-CM | POA: Diagnosis not present

## 2014-06-17 ENCOUNTER — Other Ambulatory Visit (HOSPITAL_COMMUNITY): Payer: Self-pay | Admitting: Family Medicine

## 2014-06-17 DIAGNOSIS — Z1231 Encounter for screening mammogram for malignant neoplasm of breast: Secondary | ICD-10-CM

## 2014-07-14 DIAGNOSIS — K769 Liver disease, unspecified: Secondary | ICD-10-CM | POA: Diagnosis not present

## 2014-07-14 DIAGNOSIS — Z79899 Other long term (current) drug therapy: Secondary | ICD-10-CM | POA: Diagnosis not present

## 2014-07-14 DIAGNOSIS — Z944 Liver transplant status: Secondary | ICD-10-CM | POA: Diagnosis not present

## 2014-08-08 ENCOUNTER — Ambulatory Visit (HOSPITAL_COMMUNITY)
Admission: RE | Admit: 2014-08-08 | Discharge: 2014-08-08 | Disposition: A | Discharge status: Home / Self Care | Payer: Medicare Other | Source: Ambulatory Visit | Attending: Family Medicine | Admitting: Family Medicine

## 2014-08-08 DIAGNOSIS — Z1231 Encounter for screening mammogram for malignant neoplasm of breast: Secondary | ICD-10-CM | POA: Diagnosis not present

## 2014-08-22 DIAGNOSIS — Z944 Liver transplant status: Secondary | ICD-10-CM | POA: Diagnosis not present

## 2014-08-22 DIAGNOSIS — Z79899 Other long term (current) drug therapy: Secondary | ICD-10-CM | POA: Diagnosis not present

## 2014-08-22 DIAGNOSIS — K769 Liver disease, unspecified: Secondary | ICD-10-CM | POA: Diagnosis not present

## 2014-09-02 ENCOUNTER — Encounter: Payer: Self-pay | Admitting: Orthopedic Surgery

## 2014-09-02 ENCOUNTER — Ambulatory Visit (INDEPENDENT_AMBULATORY_CARE_PROVIDER_SITE_OTHER): Payer: Medicare Other | Admitting: Orthopedic Surgery

## 2014-09-02 ENCOUNTER — Ambulatory Visit (INDEPENDENT_AMBULATORY_CARE_PROVIDER_SITE_OTHER): Payer: Medicare Other

## 2014-09-02 VITALS — BP 169/94 | Ht 64.5 in | Wt 215.0 lb

## 2014-09-02 DIAGNOSIS — M25561 Pain in right knee: Secondary | ICD-10-CM

## 2014-09-02 DIAGNOSIS — M170 Bilateral primary osteoarthritis of knee: Secondary | ICD-10-CM | POA: Diagnosis not present

## 2014-09-02 DIAGNOSIS — M25562 Pain in left knee: Secondary | ICD-10-CM

## 2014-09-02 NOTE — Progress Notes (Signed)
Patient ID: Nicki Guadalajara, female   DOB: [birth year], 74 y.o.   MRN: [id number] Patient ID: ALEESA SWEIGERT, female   DOB: 03-Sep-1940, 74 y.o.   MRN: [id number]  Chief Complaint  Patient presents with  . Knee Pain    left knee pain     EVEE LISKA is a 74 y.o. female.   HPI 74 year old female presents with bilateral knee pain left worse than right history of liver transplant 2 presents for evaluation of constant 10 out of 10 medial left knee pain which radiates down the medial shin. She also has pain over the medial bursa. She takes Tylenol and Flexeril for pain but says her pain is worse with walking.  Review of systems back pain swollen joint stiffness joints muscle weakness seasonal allergy excess and frequent your nighttime urination loss of bladder control history of spinal stenosis. Irregular heartbeat heart palpitations cough and wheezing   Review of Systems See hpi  Past Medical History  Diagnosis Date  . Hypertension   . Hx of liver transplant     1994, 1995 at [facility]  . Chronic back pain   . Palpitations   . Kidney stones   . Hyperlipidemia     Past Surgical History  Procedure Laterality Date  . Liver transplant    . Abdominal hysterectomy    . Back surgery    . Appendectomy      Family History  Problem Relation Age of Onset  . Stroke Mother   . Stroke Other     Social History History  Substance Use Topics  . Smoking status: Former Smoker -- 1.00 packs/day    Types: Cigarettes    Quit date: 04/29/1990  . Smokeless tobacco: Never Used  . Alcohol Use: No    Allergies  Allergen Reactions  . Codeine Other (See Comments)    Hallucinate, vomiting and sweating  . Demerol [Meperidine] Other (See Comments)    Hallucinations, vomiting and sweating.   . Dilaudid [Hydromorphone Hcl] Other (See Comments)    Hallucinations, sweating and vomiting  . Morphine And Related Other (See Comments)    Hallucinations, vomiting and sweating    Current Outpatient  Prescriptions  Medication Sig Dispense Refill  . acetaminophen (TYLENOL) 500 MG tablet Take 500 mg by mouth every 6 (six) hours as needed for mild pain. pain    . ezetimibe (ZETIA) 10 MG tablet Take 10 mg by mouth at bedtime.    . pravastatin (PRAVACHOL) 40 MG tablet Take 40 mg by mouth at bedtime.    . tacrolimus (PROGRAF) 1 MG capsule Take 1 mg by mouth 2 (two) times daily.    . verapamil (CALAN-SR) 240 MG CR tablet Take 240 mg by mouth daily.    Marland Kitchen aspirin EC 81 MG tablet Take 81 mg by mouth 3 (three) times a week.    . calcium-vitamin D (OSCAL WITH D) 500-200 MG-UNIT per tablet Take 1 tablet by mouth daily.    Marland Kitchen ketorolac (TORADOL) 10 MG tablet Take 1 tablet (10 mg total) by mouth every 6 (six) hours as needed for pain. (Patient not taking: Reported on 04/20/2014) 15 tablet 0  . Nebivolol HCl 20 MG TABS Take 20 mg by mouth daily.     No current facility-administered medications for this visit.       Physical Exam Blood pressure 169/94, height 5' 4.5" (1.638 m), weight 215 lb (97.523 kg). Physical Exam The patient is well developed well nourished and well groomed. Orientation to person  place and time is normal  Mood is pleasant. Ambulatory status she is ambulatory. She has an altered gait favoring her left leg. Right knee mild tenderness medially no effusion ligaments are stable motor exam is normal skin is intact.  Left knee medial joint line tenderness bursal tenderness knee is stable range of motion 115 motor exam is normal scans intact sensation and vascularity are normal  Data Reviewed I ordered an x-ray of her left knee  I read the x-ray as severe arthritis left knee I gave the patient injection. She has a history of transplant. I would not be able to handle this at [facility]. I would not feel comfortable with any potential postoperative complications and I recommend that if she has to have knee surgery that she go to [facility] where her transplant was done and  where her pain was addressed related to her spinal stenosis. She would benefit from nerve block which we don't do at our hospital referring to the femoral nerve and possible epidural catheter for postoperative pain  Assessment Encounter Diagnoses  Name Primary?  . Right knee pain Yes  . Left knee pain   . Primary osteoarthritis of both knees    Plan Inject left knee  Procedure note left knee injection verbal consent was obtained to inject left knee joint  Timeout was completed to confirm the site of injection  The medications used were 40 mg of Depo-Medrol and 1% lidocaine 3 cc  Anesthesia was provided by ethyl chloride and the skin was prepped with alcohol.  After cleaning the skin with alcohol a 20-gauge needle was used to inject the left knee joint. There were no complications. A sterile bandage was applied.

## 2014-10-13 DIAGNOSIS — Z1159 Encounter for screening for other viral diseases: Secondary | ICD-10-CM | POA: Diagnosis not present

## 2014-10-13 DIAGNOSIS — E559 Vitamin D deficiency, unspecified: Secondary | ICD-10-CM | POA: Diagnosis not present

## 2014-10-13 DIAGNOSIS — M549 Dorsalgia, unspecified: Secondary | ICD-10-CM | POA: Diagnosis not present

## 2014-10-13 DIAGNOSIS — M25569 Pain in unspecified knee: Secondary | ICD-10-CM | POA: Diagnosis not present

## 2014-10-13 DIAGNOSIS — Z944 Liver transplant status: Secondary | ICD-10-CM | POA: Diagnosis not present

## 2014-10-13 DIAGNOSIS — Z79899 Other long term (current) drug therapy: Secondary | ICD-10-CM | POA: Diagnosis not present

## 2014-10-13 DIAGNOSIS — Z5181 Encounter for therapeutic drug level monitoring: Secondary | ICD-10-CM | POA: Diagnosis not present

## 2014-10-13 DIAGNOSIS — Z13220 Encounter for screening for lipoid disorders: Secondary | ICD-10-CM | POA: Diagnosis not present

## 2014-10-13 DIAGNOSIS — G8929 Other chronic pain: Secondary | ICD-10-CM | POA: Diagnosis not present

## 2014-10-13 DIAGNOSIS — Z4823 Encounter for aftercare following liver transplant: Secondary | ICD-10-CM | POA: Diagnosis not present

## 2014-10-13 DIAGNOSIS — Z114 Encounter for screening for human immunodeficiency virus [HIV]: Secondary | ICD-10-CM | POA: Diagnosis not present

## 2014-10-22 ENCOUNTER — Observation Stay (HOSPITAL_COMMUNITY)
Admission: EM | Admit: 2014-10-22 | Discharge: 2014-10-24 | Disposition: A | Discharge status: Home / Self Care | Payer: Medicare Other | Attending: Family Medicine | Admitting: Family Medicine

## 2014-10-22 ENCOUNTER — Emergency Department (HOSPITAL_COMMUNITY): Payer: Medicare Other

## 2014-10-22 ENCOUNTER — Encounter (HOSPITAL_COMMUNITY): Payer: Self-pay | Admitting: *Deleted

## 2014-10-22 DIAGNOSIS — Z79899 Other long term (current) drug therapy: Secondary | ICD-10-CM | POA: Insufficient documentation

## 2014-10-22 DIAGNOSIS — R0789 Other chest pain: Secondary | ICD-10-CM | POA: Diagnosis not present

## 2014-10-22 DIAGNOSIS — E785 Hyperlipidemia, unspecified: Secondary | ICD-10-CM | POA: Diagnosis not present

## 2014-10-22 DIAGNOSIS — G8929 Other chronic pain: Secondary | ICD-10-CM | POA: Diagnosis not present

## 2014-10-22 DIAGNOSIS — R05 Cough: Secondary | ICD-10-CM | POA: Insufficient documentation

## 2014-10-22 DIAGNOSIS — R42 Dizziness and giddiness: Secondary | ICD-10-CM | POA: Insufficient documentation

## 2014-10-22 DIAGNOSIS — R61 Generalized hyperhidrosis: Secondary | ICD-10-CM | POA: Diagnosis not present

## 2014-10-22 DIAGNOSIS — N200 Calculus of kidney: Secondary | ICD-10-CM | POA: Insufficient documentation

## 2014-10-22 DIAGNOSIS — R110 Nausea: Secondary | ICD-10-CM | POA: Insufficient documentation

## 2014-10-22 DIAGNOSIS — Z7982 Long term (current) use of aspirin: Secondary | ICD-10-CM | POA: Diagnosis not present

## 2014-10-22 DIAGNOSIS — I10 Essential (primary) hypertension: Secondary | ICD-10-CM | POA: Diagnosis not present

## 2014-10-22 DIAGNOSIS — Z87891 Personal history of nicotine dependence: Secondary | ICD-10-CM | POA: Insufficient documentation

## 2014-10-22 DIAGNOSIS — I498 Other specified cardiac arrhythmias: Secondary | ICD-10-CM

## 2014-10-22 DIAGNOSIS — I499 Cardiac arrhythmia, unspecified: Secondary | ICD-10-CM

## 2014-10-22 DIAGNOSIS — R079 Chest pain, unspecified: Principal | ICD-10-CM | POA: Insufficient documentation

## 2014-10-22 DIAGNOSIS — M549 Dorsalgia, unspecified: Secondary | ICD-10-CM | POA: Diagnosis not present

## 2014-10-22 DIAGNOSIS — R008 Other abnormalities of heart beat: Secondary | ICD-10-CM | POA: Insufficient documentation

## 2014-10-22 DIAGNOSIS — R0981 Nasal congestion: Secondary | ICD-10-CM | POA: Diagnosis not present

## 2014-10-22 DIAGNOSIS — R000 Tachycardia, unspecified: Secondary | ICD-10-CM | POA: Diagnosis present

## 2014-10-22 HISTORY — DX: Essential (primary) hypertension: I10

## 2014-10-22 LAB — CBC WITH DIFFERENTIAL/PLATELET
BASOS PCT: 0 % (ref 0–1)
Basophils Absolute: 0.1 10*3/uL (ref 0.0–0.1)
Eosinophils Absolute: 0.2 10*3/uL (ref 0.0–0.7)
Eosinophils Relative: 2 % (ref 0–5)
HEMATOCRIT: 42.8 % (ref 36.0–46.0)
Hemoglobin: 13.9 g/dL (ref 12.0–15.0)
LYMPHS PCT: 23 % (ref 12–46)
Lymphs Abs: 3.1 10*3/uL (ref 0.7–4.0)
MCH: 30.5 pg (ref 26.0–34.0)
MCHC: 32.5 g/dL (ref 30.0–36.0)
MCV: 93.9 fL (ref 78.0–100.0)
MONOS PCT: 10 % (ref 3–12)
Monocytes Absolute: 1.3 10*3/uL — ABNORMAL HIGH (ref 0.1–1.0)
NEUTROS ABS: 8.6 10*3/uL — AB (ref 1.7–7.7)
NEUTROS PCT: 65 % (ref 43–77)
PLATELETS: 283 10*3/uL (ref 150–400)
RBC: 4.56 MIL/uL (ref 3.87–5.11)
RDW: 13.8 % (ref 11.5–15.5)
WBC: 13.2 10*3/uL — ABNORMAL HIGH (ref 4.0–10.5)

## 2014-10-22 LAB — COMPREHENSIVE METABOLIC PANEL
ALBUMIN: 4.4 g/dL (ref 3.5–5.0)
ALK PHOS: 82 U/L (ref 38–126)
ALT: 32 U/L (ref 14–54)
ANION GAP: 9 (ref 5–15)
AST: 30 U/L (ref 15–41)
BUN: 19 mg/dL (ref 6–20)
CALCIUM: 9 mg/dL (ref 8.9–10.3)
CO2: 25 mmol/L (ref 22–32)
Chloride: 108 mmol/L (ref 101–111)
Creatinine, Ser: 0.81 mg/dL (ref 0.44–1.00)
Glucose, Bld: 91 mg/dL (ref 65–99)
Potassium: 3.6 mmol/L (ref 3.5–5.1)
Sodium: 142 mmol/L (ref 135–145)
Total Bilirubin: 0.4 mg/dL (ref 0.3–1.2)
Total Protein: 6.7 g/dL (ref 6.5–8.1)

## 2014-10-22 LAB — I-STAT CHEM 8, ED
BUN: 19 mg/dL (ref 6–20)
CHLORIDE: 106 mmol/L (ref 101–111)
Calcium, Ion: 1.19 mmol/L (ref 1.13–1.30)
Creatinine, Ser: 0.9 mg/dL (ref 0.44–1.00)
Glucose, Bld: 86 mg/dL (ref 65–99)
HCT: 43 % (ref 36.0–46.0)
Hemoglobin: 14.6 g/dL (ref 12.0–15.0)
Potassium: 3.5 mmol/L (ref 3.5–5.1)
SODIUM: 144 mmol/L (ref 135–145)
TCO2: 23 mmol/L (ref 0–100)

## 2014-10-22 LAB — I-STAT TROPONIN, ED: TROPONIN I, POC: 0.01 ng/mL (ref 0.00–0.08)

## 2014-10-22 LAB — LIPASE, BLOOD: Lipase: 22 U/L (ref 22–51)

## 2014-10-22 MED ORDER — SODIUM CHLORIDE 0.9 % IV SOLN
INTRAVENOUS | Status: DC
  Administered 2014-10-22: 22:00:00 via INTRAVENOUS

## 2014-10-22 MED ORDER — ASPIRIN 81 MG PO CHEW
324.0000 mg | CHEWABLE_TABLET | Freq: Once | ORAL | Status: AC
  Administered 2014-10-22: 324 mg via ORAL
  Filled 2014-10-22: qty 4

## 2014-10-22 MED ORDER — SODIUM CHLORIDE 0.9 % IV BOLUS (SEPSIS)
250.0000 mL | Freq: Once | INTRAVENOUS | Status: AC
  Administered 2014-10-22: 250 mL via INTRAVENOUS

## 2014-10-22 MED ORDER — ONDANSETRON HCL 4 MG/2ML IJ SOLN
4.0000 mg | Freq: Once | INTRAMUSCULAR | Status: AC
  Administered 2014-10-22: 4 mg via INTRAVENOUS
  Filled 2014-10-22: qty 2

## 2014-10-22 NOTE — ED Notes (Signed)
Pt reporting "heaviness" in chest during the day today, and reporting feeling like her heart is racing.  States that she hasn't had any real SOB, but having some mild occasional nausea.  Reports that she was recently seen at [facility] and was informed that her potassium was elevated.

## 2014-10-22 NOTE — ED Provider Notes (Signed)
CSN: [id number]     Arrival date & time 10/22/14  1950 History  This chart was scribed for Fredia Sorrow, MD by Irene Pap, ED Scribe. This patient was seen in room APA09/APA09 and patient care was started at 8:08 PM.   Chief Complaint  Patient presents with  . Tachycardia   The history is provided by the patient. No language interpreter was used.   HPI Comments: Linda Barr is a 74 y.o. Female with hx of HTN and palpitations who presents to the Emergency Department complaining of intermittent increased heart rate, central chest discomfort and pressure onset 17 hours ago. States symptoms woke her up at 1 AM and rates the pain 5/10 at the worst, with episodes lasting up to two hours at a time. Reports chills, diaphoresis, dizziness, congestion, cough, frequency, back pain, and nausea.  States that she was unable to get to the doctor earlier in the day because she did not have a ride. States that she went to sleep at 11:30 PM last night with no symptoms. States that she was recently seen at [facility] and was told that her potassium levels were elevated; states that she was seen last year for palpitations in January with admission to the hospital. States that her heart doctor is at [facility]. Denies SOB, vomiting, fever, visual disturbances, abdominal pain, dysuria, hematuria, rash, headaches, bruising or bleeding easily, confusion or use of blood thinners. Denies hx of similar symptoms.   Past Medical History  Diagnosis Date  . Hypertension   . Hx of liver transplant     1994, 1995 at [facility]  . Chronic back pain   . Palpitations   . Kidney stones   . Hyperlipidemia    Past Surgical History  Procedure Laterality Date  . Liver transplant    . Abdominal hysterectomy    . Back surgery    . Appendectomy     Family History  Problem Relation Age of Onset  . Stroke Mother   . Stroke Other    History  Substance Use Topics  . Smoking status: Former Smoker -- 1.00 packs/day    Types: Cigarettes     Quit date: 04/29/1990  . Smokeless tobacco: Never Used  . Alcohol Use: No   OB History    Gravida Para Term Preterm AB TAB SAB Ectopic Multiple Living   6 5 5  1  1   5      Review of Systems  Constitutional: Positive for chills and diaphoresis. Negative for fever.  HENT: Positive for congestion. Negative for sore throat.   Eyes: Negative for visual disturbance.  Respiratory: Positive for cough. Negative for shortness of breath.   Cardiovascular: Positive for chest pain (chest discomfort). Negative for leg swelling.  Gastrointestinal: Positive for nausea. Negative for vomiting and abdominal pain.  Genitourinary: Positive for frequency. Negative for dysuria and hematuria.  Musculoskeletal: Positive for back pain.  Skin: Negative for rash.  Neurological: Positive for dizziness. Negative for headaches.  Hematological: Does not bruise/bleed easily.  Psychiatric/Behavioral: Negative for confusion.   Allergies  Codeine; Demerol; Dilaudid; and Morphine and related  Home Medications   Prior to Admission medications   Medication Sig Start Date End Date Taking? Authorizing Provider  acetaminophen (TYLENOL) 500 MG tablet Take 500 mg by mouth every 6 (six) hours as needed for mild pain. pain   Yes Historical Provider, MD  aspirin EC 81 MG tablet Take 81 mg by mouth 3 (three) times a week.   Yes Historical Provider,  MD  ezetimibe (ZETIA) 10 MG tablet Take 10 mg by mouth at bedtime.   Yes Historical Provider, MD  pravastatin (PRAVACHOL) 40 MG tablet Take 40 mg by mouth at bedtime.   Yes Historical Provider, MD  tacrolimus (PROGRAF) 1 MG capsule Take 1 mg by mouth 2 (two) times daily.   Yes Historical Provider, MD  verapamil (CALAN-SR) 240 MG CR tablet Take 240 mg by mouth daily.   Yes Historical Provider, MD   BP 184/80 mmHg  Pulse 107  Temp(Src) 98.3 F (36.8 C) (Oral)  Resp 18  Ht 5\' 4"  (1.626 m)  Wt 200 lb (90.719 kg)  BMI 34.31 kg/m2  SpO2 97%   Physical Exam  Constitutional:  She is oriented to person, place, and time. She appears well-developed and well-nourished.  HENT:  Head: Normocephalic and atraumatic.  Mouth/Throat: Oropharynx is clear and moist.  Eyes: Conjunctivae and EOM are normal. Pupils are equal, round, and reactive to light. No scleral icterus.  Neck: Normal range of motion. Neck supple.  Cardiovascular: Normal rate and regular rhythm.   No murmur heard. Pulmonary/Chest: Effort normal and breath sounds normal. She has no wheezes.  Abdominal: Soft. Bowel sounds are normal. There is no tenderness.  Musculoskeletal: Normal range of motion.  No leg swelling  Neurological: She is alert and oriented to person, place, and time. No cranial nerve deficit. She exhibits normal muscle tone. Coordination normal.  Skin: Skin is warm and dry. No rash noted.  Psychiatric: She has a normal mood and affect. Her behavior is normal.  Nursing note and vitals reviewed.   ED Course  Procedures (including critical care time) DIAGNOSTIC STUDIES: Oxygen Saturation is 97% on RA, normal by my interpretation.    COORDINATION OF CARE: 8:17 PM-Discussed treatment plan which includes labs and chest x-ray with pt at bedside and pt agreed to plan.   Labs Review Labs Reviewed  CBC WITH DIFFERENTIAL/PLATELET - Abnormal; Notable for the following:    WBC 13.2 (*)    Neutro Abs 8.6 (*)    Monocytes Absolute 1.3 (*)    All other components within normal limits  COMPREHENSIVE METABOLIC PANEL  LIPASE, BLOOD  I-STAT TROPOININ, ED  I-STAT CHEM 8, ED    Imaging Review Dg Chest Port 1 View  10/22/2014   CLINICAL DATA:  74 year old female with chest pain  EXAM: PORTABLE CHEST - 1 VIEW  COMPARISON:  Radiograph dated 04/20/2014  FINDINGS: The heart size and mediastinal contours are within normal limits. Both lungs are clear. The visualized skeletal structures are unremarkable.  IMPRESSION: No active disease.   Electronically Signed   By: Anner Crete M.D.   On: 10/22/2014  20:46     EKG Interpretation   Date/Time:  Wednesday October 22 2014 20:00:03 EDT Ventricular Rate:  108 PR Interval:  172 QRS Duration: 82 QT Interval:  374 QTC Calculation: 501 R Axis:   29 Text Interpretation:  Sinus tachycardia Ventricular bigeminy Confirmed by  Moriya Mitchell  MD, Alexsus Papadopoulos ([id number]) on 10/22/2014 8:05:07 PM      MDM   Final diagnoses:  Chest pain  Bigeminy    Patient with palpitations since 1 in the morning. Also chest discomfort described as a pressure. There is been intermittent. Patient arrived here with EKG showing bigeminy. Patient's electrolytes are normal first troponin negative. Chest x-ray without any acute findings. Patient will require admission for chest pain rule out and for cardiac monitoring for the arrhythmia. Patient did convert back to a sinus rhythm then started  back in with PVCs but currently is not in bigeminy. Patient has been followed by cardiology here in the past. Patient's primary care doctor is Dr. Emilee Hero.  I personally performed the services described in this documentation, which was scribed in my presence. The recorded information has been reviewed and is accurate.       Fredia Sorrow, MD 10/22/14 2153

## 2014-10-22 NOTE — H&P (Signed)
[facility] History and Physical  Linda Barr HWE:[id number] DOB: 08-Apr-1940    PCP:   Lanette Hampshire, MD   Chief Complaint: chest pain and palpitation.   HPI: Linda Barr is an 74 y.o. female with hx of liver transplant 20+ years ago, HTN, chronic low back pain, HLD, on intermittent ASA and Verapamil 240 SR daily, presented to the ER with palpitation on/off for the past  24 hours.  She also had retrosternal chest tightness without SOB, nausea, vomiting, fever or chills.  She has no coughs, pleuritic chest pain, orthopnea or PND.  Evaluation in the ER Included an EKG which showed Ventricular bigeminy and no acute ischemic or injurious ST changes.  Her WBC was 13.2K and Hb of 13.9g per dL, normal LFTs and normal renal fx tests.  Her K was normal as well.  Initial troponin was negative.  Hospitalist was asked to admit her for r/out.   Rewiew of Systems:  Constitutional: Negative for malaise, fever and chills. No significant weight loss or weight gain Eyes: Negative for eye pain, redness and discharge, diplopia, visual changes, or flashes of light. ENMT: Negative for ear pain, hoarseness, nasal congestion, sinus pressure and sore throat. No headaches; tinnitus, drooling, or problem swallowing. Cardiovascular: Negative for  diaphoresis, dyspnea and peripheral edema. ; No orthopnea, PND Respiratory: Negative for cough, hemoptysis, wheezing and stridor. No pleuritic chestpain. Gastrointestinal: Negative for nausea, vomiting, diarrhea, constipation, abdominal pain, melena, blood in stool, hematemesis, jaundice and rectal bleeding.    Genitourinary: Negative for frequency, dysuria, incontinence,flank pain and hematuria; Musculoskeletal: Negative for back pain and neck pain. Negative for swelling and trauma.;  Skin: . Negative for pruritus, rash, abrasions, bruising and skin lesion.; ulcerations Neuro: Negative for headache, lightheadedness and neck stiffness. Negative for weakness, altered  level of consciousness , altered mental status, extremity weakness, burning feet, involuntary movement, seizure and syncope.  Psych: negative for anxiety, depression, insomnia, tearfulness, panic attacks, hallucinations, paranoia, suicidal or homicidal ideation    Past Medical History  Diagnosis Date  . Hypertension   . Hx of liver transplant     1994, 1995 at [facility]  . Chronic back pain   . Palpitations   . Kidney stones   . Hyperlipidemia     Past Surgical History  Procedure Laterality Date  . Liver transplant    . Abdominal hysterectomy    . Back surgery    . Appendectomy      Medications:  HOME MEDS: Prior to Admission medications   Medication Sig Start Date End Date Taking? Authorizing Provider  acetaminophen (TYLENOL) 500 MG tablet Take 500 mg by mouth every 6 (six) hours as needed for mild pain. pain   Yes Historical Provider, MD  aspirin EC 81 MG tablet Take 81 mg by mouth 3 (three) times a week.   Yes Historical Provider, MD  ezetimibe (ZETIA) 10 MG tablet Take 10 mg by mouth at bedtime.   Yes Historical Provider, MD  pravastatin (PRAVACHOL) 40 MG tablet Take 40 mg by mouth at bedtime.   Yes Historical Provider, MD  tacrolimus (PROGRAF) 1 MG capsule Take 1 mg by mouth 2 (two) times daily.   Yes Historical Provider, MD  verapamil (CALAN-SR) 240 MG CR tablet Take 240 mg by mouth daily.   Yes Historical Provider, MD     Allergies:  Allergies  Allergen Reactions  . Codeine Other (See Comments)    Hallucinate, vomiting and sweating  . Demerol [Meperidine] Other (See Comments)  Hallucinations, vomiting and sweating.   . Dilaudid [Hydromorphone Hcl] Other (See Comments)    Hallucinations, sweating and vomiting  . Morphine And Related Other (See Comments)    Hallucinations, vomiting and sweating    Social History:   reports that she quit smoking about 24 years ago. Her smoking use included Cigarettes. She smoked 1.00 pack per day. She has never used smokeless  tobacco. She reports that she does not drink alcohol or use illicit drugs.  Family History: Family History  Problem Relation Age of Onset  . Stroke Mother   . Stroke Other      Physical Exam: Filed Vitals:   10/22/14 2130 10/22/14 2200 10/22/14 2230 10/22/14 2300  BP: 138/82 139/73  151/76  Pulse: 72 73 74 60  Temp:      TempSrc:      Resp: 19 24 19 22   Height:      Weight:      SpO2: 96% 94% 97% 96%   Blood pressure 151/76, pulse 60, temperature 98.3 F (36.8 C), temperature source Oral, resp. rate 22, height 5\' 4"  (1.626 m), weight 90.719 kg (200 lb), SpO2 96 %.  GEN:  Pleasant  patient lying in the stretcher in no acute distress; cooperative with exam. PSYCH:  alert and oriented x4; does not appear anxious or depressed; affect is appropriate. HEENT: Mucous membranes pink and anicteric; PERRLA; EOM intact; no cervical lymphadenopathy nor thyromegaly or carotid bruit; no JVD; There were no stridor. Neck is very supple. Breasts:: Not examined CHEST WALL: No tenderness CHEST: Normal respiration, clear to auscultation bilaterally.  HEART: Regular rate and rhythm.  There are no murmur, rub, or gallops.   BACK: No kyphosis or scoliosis; no CVA tenderness ABDOMEN: soft and non-tender; no masses, no organomegaly, normal abdominal bowel sounds; no pannus; no intertriginous candida. There is no rebound and no distention. Rectal Exam: Not done EXTREMITIES: No bone or joint deformity; age-appropriate arthropathy of the hands and knees; no edema; no ulcerations.  There is no calf tenderness. Genitalia: not examined PULSES: 2+ and symmetric SKIN: Normal hydration no rash or ulceration CNS: Cranial nerves 2-12 grossly intact no focal lateralizing neurologic deficit.  Speech is fluent; uvula elevated with phonation, facial symmetry and tongue midline. DTR are normal bilaterally, cerebella exam is intact, barbinski is negative and strengths are equaled bilaterally.  No sensory loss.   Labs  on Admission:  Basic Metabolic Panel:  Recent Labs Lab 10/22/14 2015 10/22/14 2049  NA 142 144  K 3.6 3.5  CL 108 106  CO2 25  --   GLUCOSE 91 86  BUN 19 19  CREATININE 0.81 0.90  CALCIUM 9.0  --    Liver Function Tests:  Recent Labs Lab 10/22/14 2015  AST 30  ALT 32  ALKPHOS 82  BILITOT 0.4  PROT 6.7  ALBUMIN 4.4    Recent Labs Lab 10/22/14 2015  LIPASE 22   No results for input(s): AMMONIA in the last 168 hours. CBC:  Recent Labs Lab 10/22/14 2015 10/22/14 2049  WBC 13.2*  --   NEUTROABS 8.6*  --   HGB 13.9 14.6  HCT 42.8 43.0  MCV 93.9  --   PLT 283  --     Radiological Exams on Admission: Dg Chest Port 1 View  10/22/2014   CLINICAL DATA:  74 year old female with chest pain  EXAM: PORTABLE CHEST - 1 VIEW  COMPARISON:  Radiograph dated 04/20/2014  FINDINGS: The heart size and mediastinal contours are within normal  limits. Both lungs are clear. The visualized skeletal structures are unremarkable.  IMPRESSION: No active disease.   Electronically Signed   By: Anner Crete M.D.   On: 10/22/2014 20:46    EKG: Independently reviewed.    Assessment/Plan Present on Admission:  . Chest pain . HTN (hypertension) . Ventricular bigeminy . Atypical chest pain  PLAN:  Will admit her to Dr Everette Rank as per prior arrangement.  She has bigeminy, which is a benign rhythm, though the palpitation is bothering her.  I will add a low dose Betablocker to her Verapamil, and I suspect she will tolerate it well.  Will give 25mg  BID, and continue her CCB at 240mg  per day. Will continue her ASA at 81mg  daily.  Since there is no additional benefit with combination Zetia+Statin, will d/c Zetia and continue with Pravachol.  She will be r/out with serial troponins.  Hopefully, with the addition of low dose betablocker, her BP will be better controlled.  For her hx of liver transplant, will continue her home suppressive therapy meds.  She is stable, full code, and will be admitted  to Dr Everette Rank.  Thank you for allowing me to participate in her care.  Good day.  Other plans as per orders.  Code Status: FULL CODE>    Orvan Falconer, MD. [facility] Pager [phone] 7pm to 7am.  10/22/2014, 11:09 PM

## 2014-10-22 NOTE — ED Notes (Signed)
Assumed care of patient from [place], [place]. Pt lying on stretcher resting quietly with stable vitals at this time. Awaiting bed assignment, updated patient. Call bell within reach. Pt denies pain. No distress. Will monitor.

## 2014-10-23 ENCOUNTER — Encounter (HOSPITAL_COMMUNITY): Payer: Self-pay | Admitting: *Deleted

## 2014-10-23 DIAGNOSIS — E785 Hyperlipidemia, unspecified: Secondary | ICD-10-CM | POA: Diagnosis not present

## 2014-10-23 DIAGNOSIS — R079 Chest pain, unspecified: Secondary | ICD-10-CM | POA: Diagnosis not present

## 2014-10-23 DIAGNOSIS — R0789 Other chest pain: Secondary | ICD-10-CM | POA: Diagnosis not present

## 2014-10-23 DIAGNOSIS — I10 Essential (primary) hypertension: Secondary | ICD-10-CM | POA: Diagnosis not present

## 2014-10-23 DIAGNOSIS — E780 Pure hypercholesterolemia: Secondary | ICD-10-CM

## 2014-10-23 DIAGNOSIS — I499 Cardiac arrhythmia, unspecified: Secondary | ICD-10-CM | POA: Diagnosis not present

## 2014-10-23 LAB — TROPONIN I

## 2014-10-23 LAB — TSH: TSH: 1.003 u[IU]/mL (ref 0.350–4.500)

## 2014-10-23 LAB — MAGNESIUM: MAGNESIUM: 1.7 mg/dL (ref 1.7–2.4)

## 2014-10-23 MED ORDER — ASPIRIN EC 81 MG PO TBEC
81.0000 mg | DELAYED_RELEASE_TABLET | ORAL | Status: DC
  Administered 2014-10-24: 81 mg via ORAL
  Filled 2014-10-23: qty 1

## 2014-10-23 MED ORDER — POTASSIUM CHLORIDE CRYS ER 20 MEQ PO TBCR
20.0000 meq | EXTENDED_RELEASE_TABLET | Freq: Once | ORAL | Status: AC
Start: 2014-10-23 — End: 2014-10-23
  Administered 2014-10-23: 20 meq via ORAL
  Filled 2014-10-23: qty 1

## 2014-10-23 MED ORDER — SODIUM CHLORIDE 0.9 % IJ SOLN
3.0000 mL | Freq: Two times a day (BID) | INTRAMUSCULAR | Status: DC
  Administered 2014-10-23 – 2014-10-24 (×4): 3 mL via INTRAVENOUS

## 2014-10-23 MED ORDER — VERAPAMIL HCL ER 240 MG PO TBCR
240.0000 mg | EXTENDED_RELEASE_TABLET | Freq: Every day | ORAL | Status: DC
  Administered 2014-10-23 – 2014-10-24 (×2): 240 mg via ORAL
  Filled 2014-10-23 (×2): qty 1

## 2014-10-23 MED ORDER — ENOXAPARIN SODIUM 40 MG/0.4ML ~~LOC~~ SOLN
40.0000 mg | SUBCUTANEOUS | Status: DC
  Filled 2014-10-23: qty 0.4

## 2014-10-23 MED ORDER — TACROLIMUS 1 MG PO CAPS
1.0000 mg | ORAL_CAPSULE | Freq: Two times a day (BID) | ORAL | Status: DC
  Administered 2014-10-23 – 2014-10-24 (×3): 1 mg via ORAL
  Filled 2014-10-23 (×13): qty 1

## 2014-10-23 MED ORDER — METOPROLOL TARTRATE 25 MG PO TABS
25.0000 mg | ORAL_TABLET | Freq: Two times a day (BID) | ORAL | Status: DC
  Administered 2014-10-23 – 2014-10-24 (×3): 25 mg via ORAL
  Filled 2014-10-23 (×3): qty 1

## 2014-10-23 MED ORDER — ACETAMINOPHEN 500 MG PO TABS
500.0000 mg | ORAL_TABLET | Freq: Four times a day (QID) | ORAL | Status: DC | PRN
  Administered 2014-10-23 – 2014-10-24 (×3): 500 mg via ORAL
  Filled 2014-10-23 (×3): qty 1

## 2014-10-23 MED ORDER — PRAVASTATIN SODIUM 40 MG PO TABS
40.0000 mg | ORAL_TABLET | Freq: Every day | ORAL | Status: DC
  Administered 2014-10-23 (×2): 40 mg via ORAL
  Filled 2014-10-23 (×2): qty 1

## 2014-10-23 NOTE — Consult Note (Signed)
CARDIOLOGY CONSULT NOTE   Patient ID: Linda Barr MRN: [id number] DOB/AGE: 1940/06/10 74 y.o.  Admit Date: 10/22/2014 Referring Physician: PTH Primary Physician: Lanette Hampshire, MD Consulting Cardiologist: Rozann Lesches MD Reason for Consultation: Chest Pain and Palpitations  Clinical Summary Linda Barr is a 74 y.o.female with known history of hypertension, liver transplant 1994, hypercholesterolemia,  palpitations and chronic back pain. She was last evaluated by cardiology in 04/21/2014 for chest pain and bradycardia. At that time, bystolic was discontinued and she was continued on verapamil which she had been on long-term. Heart rate improved.    She states that she was at [facility] a week ago for a follow-up liver transplant evaluation, was told that she was doing very well, however called later about her potassium being elevated (around 5.0 she recalls). She became anxious and started to experience more palpitations than usual prompting an ER visit.  She denies NVD, excessive fluid loss or diaphoresis. She lives alone but has a caregiver with her, and occasionally spends the night with her daughter who cares for her as well.   She presented to the ER with complaints of palpitations and heart racing. She was found to be hypertensive, BP 184/80, HR 107, O2 Sat 97% afebrile. Review of labs demonstrated K of 3.5, creatinine 0.90; LFT WNL, WBC of 13.2. CXR negative for pneumonia or CHF. EKG sinus tachycardia with Bigeminy. She was treated with IV fluids, ASA and Zofran.  Dr. Truman Hayward, [facility] physician began metoprolol 25 mg BID and continued verapamil. She states that she is feeling much better now and no longer feels palpitations.  We are asked for cardiology recommendations.   Allergies  Allergen Reactions  . Codeine Other (See Comments)    Hallucinate, vomiting and sweating  . Demerol [Meperidine] Other (See Comments)    Hallucinations, vomiting and sweating.   . Dilaudid [Hydromorphone Hcl]  Other (See Comments)    Hallucinations, sweating and vomiting  . Morphine And Related Other (See Comments)    Hallucinations, vomiting and sweating    Medications Scheduled Medications: . [START ON 10/24/2014] aspirin EC  81 mg Oral Once per day on Mon Wed Fri  . enoxaparin (LOVENOX) injection  40 mg Subcutaneous Q24H  . metoprolol tartrate  25 mg Oral BID  . pravastatin  40 mg Oral QHS  . sodium chloride  3 mL Intravenous Q12H  . tacrolimus  1 mg Oral BID  . verapamil  240 mg Oral Daily    Past Medical History  Diagnosis Date  . Essential hypertension   . Hx of liver transplant     1994, 1995 at [facility]  . Chronic back pain   . Palpitations   . Kidney stones   . Hyperlipidemia     Past Surgical History  Procedure Laterality Date  . Liver transplant    . Abdominal hysterectomy    . Back surgery    . Appendectomy      Family History  Problem Relation Age of Onset  . Stroke Mother   . Stroke Other     Social History Linda Barr reports that she quit smoking about 24 years ago. Her smoking use included Cigarettes. She smoked 1.00 pack per day. She has never used smokeless tobacco. Linda Barr reports that she does not drink alcohol.  Review of Systems Complete review of systems are found to be negative unless outlined in H&P above. No fevers or chills, no chest pain.  Physical Examination Blood pressure 145/87, pulse 77, temperature 98  F (36.7 C), temperature source Oral, resp. rate 20, height 5\' 4"  (1.626 m), weight 200 lb (90.719 kg), SpO2 95 %. No intake or output data in the 24 hours ending 10/23/14 0919  Telemetry: NSR rates in the 60's and & 70's with PVC's.   GEN: No acute distress  HEENT: Conjunctiva and lids normal, oropharynx clear with moist mucosa. Neck: Supple, no elevated JVP or carotid bruits, no thyromegaly. Lungs: Clear to auscultation, nonlabored breathing at rest. Cardiac: Regular rate and rhythm, no S3 or significant systolic murmur, no pericardial  rub. Abdomen: Soft, nontender, no hepatomegaly, bowel sounds present, no guarding or rebound. Extremities: No pitting edema, distal pulses 2+. Skin: Warm and dry. Musculoskeletal: No kyphosis. Neuropsychiatric: Alert and oriented x3, affect grossly appropriate.  Prior Cardiac Testing/Procedures 1.Echocardiogram 04/22/2014 Left ventricle: The cavity size was normal. Wall thickness was increased increased in a pattern of mild to moderate LVH. Systolic function was normal. The estimated ejection fraction was in the range of 60% to 65%. Wall motion was normal; there were no regional wall motion abnormalities. Features are consistent with a pseudonormal left ventricular filling pattern, with concomitant abnormal relaxation and increased filling pressure (grade 2 diastolic dysfunction). - Aortic valve: Mildly to moderately calcified annulus. Trileaflet; mildly calcified leaflets. There was trivial regurgitation. - Mitral valve: Calcified annulus. There was trivial regurgitation. - Right atrium: Central venous pressure (est): 3 mm Hg. - Atrial septum: No defect or patent foramen ovale was identified. - Tricuspid valve: There was trivial regurgitation. - Pulmonary arteries: PA peak pressure: 23 mm Hg (S). - Pericardium, extracardiac: There was no pericardial effusion.  Lab Results  Basic Metabolic Panel:  Recent Labs Lab 10/22/14 2015 10/22/14 2049  NA 142 144  K 3.6 3.5  CL 108 106  CO2 25  --   GLUCOSE 91 86  BUN 19 19  CREATININE 0.81 0.90  CALCIUM 9.0  --     Liver Function Tests:  Recent Labs Lab 10/22/14 2015  AST 30  ALT 32  ALKPHOS 82  BILITOT 0.4  PROT 6.7  ALBUMIN 4.4    CBC:  Recent Labs Lab 10/22/14 2015 10/22/14 2049  WBC 13.2*  --   NEUTROABS 8.6*  --   HGB 13.9 14.6  HCT 42.8 43.0  MCV 93.9  --   PLT 283  --     Cardiac Enzymes:  Recent Labs Lab 10/22/14 2010 10/23/14 0357  TROPONINI <0.03 <0.03    Radiology: Dg  Chest Port 1 View  10/22/2014   CLINICAL DATA:  74 year old female with chest pain  EXAM: PORTABLE CHEST - 1 VIEW  COMPARISON:  Radiograph dated 04/20/2014  FINDINGS: The heart size and mediastinal contours are within normal limits. Both lungs are clear. The visualized skeletal structures are unremarkable.  IMPRESSION: No active disease.   Electronically Signed   By: Anner Crete M.D.   On: 10/22/2014 20:46    ECG: SR with Bigeminy and frequent PVC's.   Impression and Recommendations  1. Palpitations and PVCs:  Patient has known history of palpitations, recent escalation in symptoms in the setting of anxiety. No chest pain or syncope. Will check magnesium and give potassium dose to keep potassium around 4.0. Last echo 04/2014, did not reveal reduced EF or structural abnormalities. Doubt need to repeat echo  She appears to be tolerating BB therapy without recurrent symptoms or bradycardia.  2. Hypertension: Initially elevated on presentation, but now normalized. No other changes are planned in medication regimen with the exception of  potassium repletion.   3. Hypercholesterolemia: States she is on Pravachol and Zetia. States that Dr. Truman Hayward, PTH, er that she does not need to be on Zetia as it has no positive effect on her lipid status. She wishes to stop it. I do no have current labs to confirm status of her cholesterol. Will check fasting lipids.   4. S/P Liver Transplant: Completed in 1994 at [facility]. She is followed there and actually saw them Monday for follow up. Continues on Prograft therpy.  Signed: Phill Myron. Lawrence NP [place]  10/23/2014, 9:19 AM Co-Sign MD   Attending note:  Patient seen and examined. Reviewed records and discussed the case with Ms. Lawrence NP. Modified above note. Ms. Kosar presents with a feeling of increased PVCs over the last few days, tracks this back to feeling anxious about a call from the [facility] liver transplant program regarding her potassium being mildly elevated.  She admits that she is anxious about her health care overall, but has generally been doing well. She reports a forceful heartbeat also mild chest discomfort with these symptoms. Hypertensive at presentation. No syncope. She has continued on verapamil as an outpatient for hypertension, was previously taken off Bystolic in January due to symptomatic bradycardia. On examination she appears comfortable, lungs are clear, cardiac exam with RRR and no gallop. Lab work shows potassium 3.5, normal renal function and hemoglobin. Chest x-ray unremarkable. ECG reviewed with ventricular bigeminy. She had an echocardiogram done in January demonstrating normal LVEF. She has been started on low-dose Lopressor by the primary team and does feel better, not bradycardic at this time. Now would continue both verapamil and Lopressor since this seems to be helping symptoms and reducing her PVCs, although would be cognizant for any developing bradycardia that may require adjustment in dose. No other cardiac testing planned at this time.  Satira Sark, M.D., F.A.C.C.

## 2014-10-23 NOTE — Care Management Note (Signed)
Case Management Note  Patient Details  Name: CHARLAINE UTSEY MRN: [id number] Date of Birth: [birth year]  Subjective/Objective:                  Pt admitted from home with CP. Pt lives with a friend and will return home at discharge. Pt is fairly independent with ADL's. Pt has a walker, crutches, and cane for prn use.  Action/Plan: No CM needs noted. Anticipate discharge within 24 hours.  Expected Discharge Date:  10/24/14               Expected Discharge Plan:  Home/Self Care  In-House Referral:  NA  Discharge planning Services  CM Consult  Post Acute Care Choice:  NA Choice offered to:  NA  DME Arranged:    DME Agency:     HH Arranged:    HH Agency:     Status of Service:  Completed, signed off  Medicare Important Message Given:    Date Medicare IM Given:    Medicare IM give by:    Date Additional Medicare IM Given:    Additional Medicare Important Message give by:     If discussed at [place] of Stay Meetings, dates discussed:    Additional Comments:  Joylene Draft, RN 10/23/2014, 12:30 PM

## 2014-10-23 NOTE — Progress Notes (Signed)
Subjective: The patient is alert and oriented and not complaining of palpitations this a.m. She was admitted to the hospital because of intermittent palpitations and chest discomfort which is been present for some 24 hours prior to admission  Objective: Vital signs in last 24 hours: Temp:  [98 F (36.7 C)-98.3 F (36.8 C)] 98 F (36.7 C) (07/21 0535) Pulse Rate:  [45-107] 77 (07/21 0535) Resp:  [13-27] 20 (07/21 0535) BP: (119-184)/(66-90) 145/87 mmHg (07/21 0535) SpO2:  [92 %-97 %] 95 % (07/21 0535) Weight:  [90.719 kg (200 lb)] 90.719 kg (200 lb) (07/20 1957) Weight change:  Last BM Date: 10/22/14  Intake/Output from previous day:   Intake/Output this shift:    Physical Exam: Gen. appearance alert and oriented female  HEENT negative  Heart regular rhythm no murmurs  Lungs clear to P&A  Abdomen no palpable organs or masses  Extremities free free of edema   Recent Labs  10/22/14 2015 10/22/14 2049  WBC 13.2*  --   HGB 13.9 14.6  HCT 42.8 43.0  PLT 283  --    BMET  Recent Labs  10/22/14 2015 10/22/14 2049  NA 142 144  K 3.6 3.5  CL 108 106  CO2 25  --   GLUCOSE 91 86  BUN 19 19  CREATININE 0.81 0.90  CALCIUM 9.0  --     Studies/Results: Dg Chest Port 1 View  10/22/2014   CLINICAL DATA:  74 year old female with chest pain  EXAM: PORTABLE CHEST - 1 VIEW  COMPARISON:  Radiograph dated 04/20/2014  FINDINGS: The heart size and mediastinal contours are within normal limits. Both lungs are clear. The visualized skeletal structures are unremarkable.  IMPRESSION: No active disease.   Electronically Signed   By: Anner Crete M.D.   On: 10/22/2014 20:46    Medications:  . [START ON 10/24/2014] aspirin EC  81 mg Oral Once per day on Mon Wed Fri  . enoxaparin (LOVENOX) injection  40 mg Subcutaneous Q24H  . metoprolol tartrate  25 mg Oral BID  . pravastatin  40 mg Oral QHS  . sodium chloride  3 mL Intravenous Q12H  . tacrolimus  1 mg Oral BID  . verapamil   240 mg Oral Daily        Assessment/Plan: 1. Chest discomfort and palpitations bigeminy plan to continue current metoprolol 25 mg twice a day and verapamil 240 mg daily as ordered will obtain cardiology consult  2. Hypertension essential  3. History of liver transplant     Delynn Pursley G 10/23/2014, 6:32 AM

## 2014-10-23 NOTE — Care Management Note (Signed)
Case Management Note  Patient Details  Name: Linda Barr MRN: [id number] Date of Birth: 18-Dec-1940  Subjective/Objective:                    Action/Plan:   Expected Discharge Date:  10/24/14               Expected Discharge Plan:  Home/Self Care  In-House Referral:  NA  Discharge planning Services  CM Consult  Post Acute Care Choice:  NA Choice offered to:  NA  DME Arranged:    DME Agency:     HH Arranged:    HH Agency:     Status of Service:  Completed, signed off  Medicare Important Message Given:    Date Medicare IM Given:    Medicare IM give by:    Date Additional Medicare IM Given:    Additional Medicare Important Message give by:     If discussed at [place] of Stay Meetings, dates discussed:    Additional Comments: Pt signed Medicare OBS notification form and form placed on chart. Christinia Gully [place], RN 10/23/2014, 12:32 PM

## 2014-10-24 DIAGNOSIS — I499 Cardiac arrhythmia, unspecified: Secondary | ICD-10-CM | POA: Diagnosis not present

## 2014-10-24 DIAGNOSIS — R079 Chest pain, unspecified: Secondary | ICD-10-CM | POA: Diagnosis not present

## 2014-10-24 DIAGNOSIS — E785 Hyperlipidemia, unspecified: Secondary | ICD-10-CM | POA: Diagnosis not present

## 2014-10-24 DIAGNOSIS — R072 Precordial pain: Secondary | ICD-10-CM

## 2014-10-24 DIAGNOSIS — I10 Essential (primary) hypertension: Secondary | ICD-10-CM | POA: Diagnosis not present

## 2014-10-24 LAB — LIPID PANEL
Cholesterol: 125 mg/dL (ref 0–200)
HDL: 43 mg/dL (ref >40)
LDL Cholesterol: 60 mg/dL (ref 0–99)
TRIGLYCERIDES: 108 mg/dL (ref <150)
Total CHOL/HDL Ratio: 2.9 RATIO
VLDL: 22 mg/dL (ref 0–40)

## 2014-10-24 MED ORDER — METOPROLOL TARTRATE 25 MG PO TABS: 25.0000 mg | ORAL_TABLET | Freq: Two times a day (BID) | ORAL | Status: DC

## 2014-10-24 NOTE — Care Management Note (Signed)
Case Management Note  Patient Details  Name: Linda Barr MRN: [id number] Date of Birth: [birth year]  Subjective/Objective:                    Action/Plan:   Expected Discharge Date:  10/24/14               Expected Discharge Plan:  Home/Self Care  In-House Referral:  NA  Discharge planning Services  CM Consult  Post Acute Care Choice:  NA Choice offered to:  NA  DME Arranged:    DME Agency:     HH Arranged:    HH Agency:     Status of Service:  Completed, signed off  Medicare Important Message Given:    Date Medicare IM Given:    Medicare IM give by:    Date Additional Medicare IM Given:    Additional Medicare Important Message give by:     If discussed at [place] of Stay Meetings, dates discussed:    Additional Comments: Pt to be discharged home today. No Cm needs noted. Christinia Gully [place], RN 10/24/2014, 9:51 AM

## 2014-10-24 NOTE — Discharge Summary (Signed)
Physician Discharge Summary  Linda Barr NWG:[id number] DOB: April 11, 1940 DOA: 10/22/2014  PCP: Lanette Hampshire, MD  Admit date: 10/22/2014 Discharge date: 10/24/2014     Discharge Diagnoses:  1. Cardiac arrhythmia, bigeminy 2. Essential hypertension 3. Chronic anxiety 4. History of liver transplant 5. Dyslipidemia  Discharge Condition: Stable Disposition: Home  Diet recommendation: Continue low-cholesterol diet  Filed Weights   10/22/14 1957  Weight: 90.719 kg (200 lb)    History of present illness:  The patient presented to the ED with chief complaint being chest tightness and heart palpitations which been present for some 24 hours intermittently. Evaluation in ED EKG showed ventricular bigeminy no acute ischemic changes or ST changes  Hospital Course:  Examination on admission revealed alert patient. Heart rate regular rhythm lungs clear to P&A abdomen no palpable organs or masses. Patient was thought to have bigeminy on admission. She has a history of liver transplant and followed for this problem at [facility]. She is continued on CCB at 240 mg daily. She was continued on metoprolol 25 mg twice a day. Patient remained in sinus rhythm. Her blood pressure was slightly elevated on admission chest x-ray was negative for pneumonia or CHF, EKG showed evidence of sinus tachycardia with bigeminy she was treated with IV fluids. She is continued on metoprolol 25 mg twice a day as well as verapamil Her condition remained stable and subsequently discharged  Discharge Instructions Patient was asked to continue medications listed below. She is advised to make formal with primary care physician in 3-5 days and to continue medications listed below    Medication List    STOP taking these medications        ezetimibe 10 MG tablet  Commonly known as:  ZETIA      TAKE these medications        acetaminophen 500 MG tablet  Commonly known as:  TYLENOL  Take 500 mg by mouth every 6 (six)  hours as needed for mild pain. pain     aspirin EC 81 MG tablet  Take 81 mg by mouth 3 (three) times a week.     metoprolol tartrate 25 MG tablet  Commonly known as:  LOPRESSOR  Take 1 tablet (25 mg total) by mouth 2 (two) times daily.     pravastatin 40 MG tablet  Commonly known as:  PRAVACHOL  Take 40 mg by mouth at bedtime.     tacrolimus 1 MG capsule  Commonly known as:  PROGRAF  Take 1 mg by mouth 2 (two) times daily.     verapamil 240 MG CR tablet  Commonly known as:  CALAN-SR  Take 240 mg by mouth daily.       Allergies  Allergen Reactions  . Codeine Other (See Comments)    Hallucinate, vomiting and sweating  . Demerol [Meperidine] Other (See Comments)    Hallucinations, vomiting and sweating.   . Dilaudid [Hydromorphone Hcl] Other (See Comments)    Hallucinations, sweating and vomiting  . Morphine And Related Other (See Comments)    Hallucinations, vomiting and sweating    The results of significant diagnostics from this hospitalization (including imaging, microbiology, ancillary and laboratory) are listed below for reference.    Significant Diagnostic Studies: Dg Chest Port 1 View  10/22/2014   CLINICAL DATA:  74 year old female with chest pain  EXAM: PORTABLE CHEST - 1 VIEW  COMPARISON:  Radiograph dated 04/20/2014  FINDINGS: The heart size and mediastinal contours are within normal limits. Both lungs are clear. The  visualized skeletal structures are unremarkable.  IMPRESSION: No active disease.   Electronically Signed   By: Anner Crete M.D.   On: 10/22/2014 20:46    Microbiology: No results found for this or any previous visit (from the past 240 hour(s)).   Labs: Basic Metabolic Panel:  Recent Labs Lab 10/22/14 2015 10/22/14 2049 10/23/14 0945  NA 142 144  --   K 3.6 3.5  --   CL 108 106  --   CO2 25  --   --   GLUCOSE 91 86  --   BUN 19 19  --   CREATININE 0.81 0.90  --   CALCIUM 9.0  --   --   MG  --   --  1.7   Liver Function  Tests:  Recent Labs Lab 10/22/14 2015  AST 30  ALT 32  ALKPHOS 82  BILITOT 0.4  PROT 6.7  ALBUMIN 4.4    Recent Labs Lab 10/22/14 2015  LIPASE 22   No results for input(s): AMMONIA in the last 168 hours. CBC:  Recent Labs Lab 10/22/14 2015 10/22/14 2049  WBC 13.2*  --   NEUTROABS 8.6*  --   HGB 13.9 14.6  HCT 42.8 43.0  MCV 93.9  --   PLT 283  --    Cardiac Enzymes:  Recent Labs Lab 10/22/14 2010 10/23/14 0357 10/23/14 0945  TROPONINI <0.03 <0.03 <0.03   BNP: BNP (last 3 results) No results for input(s): BNP in the last 8760 hours.  ProBNP (last 3 results) No results for input(s): PROBNP in the last 8760 hours.  CBG: No results for input(s): GLUCAP in the last 168 hours.  Principal Problem:   Chest pain Active Problems:   HTN (hypertension)   Ventricular bigeminy   Atypical chest pain   Time coordinating discharge: 30 minutes  Signed:  Marjean Donna, MD 10/24/2014, 5:06 PM

## 2014-10-24 NOTE — Progress Notes (Signed)
Patient ID: Linda Barr, female   DOB: Dec 02, 1940, 74 y.o.   MRN: [id number]    Subjective:  Denies SSCP, palpitations or Dyspnea   Objective:  Filed Vitals:   10/23/14 1330 10/23/14 2222 10/24/14 0603 10/24/14 0800  BP: 128/75 159/72 170/87 171/91  Pulse: 74 72 82 82  Temp: 98.1 F (36.7 C) 98.5 F (36.9 C) 97.8 F (36.6 C)   TempSrc: Oral Oral Oral   Resp: 20 20 20 18   Height:      Weight:      SpO2: 96% 94% 97%     Intake/Output from previous day:  Intake/Output Summary (Last 24 hours) at 10/24/14 8938 Last data filed at 10/23/14 1757  Gross per 24 hour  Intake    960 ml  Output      0 ml  Net    960 ml    Physical Exam: Affect appropriate Healthy:  appears stated age HEENT: normal Neck supple with no adenopathy JVP normal no bruits no thyromegaly Lungs clear with no wheezing and good diaphragmatic motion Heart:  S1/S2 no murmur, no rub, gallop or click PMI normal Abdomen: benighn, BS positve, no tenderness, no AAA Post liver transplant no bruit.  No HSM or HJR Distal pulses intact with no bruits No edema Neuro non-focal Skin warm and dry No muscular weakness   Lab Results: Basic Metabolic Panel:  Recent Labs  10/22/14 2015 10/22/14 2049 10/23/14 0945  NA 142 144  --   K 3.6 3.5  --   CL 108 106  --   CO2 25  --   --   GLUCOSE 91 86  --   BUN 19 19  --   CREATININE 0.81 0.90  --   CALCIUM 9.0  --   --   MG  --   --  1.7   Liver Function Tests:  Recent Labs  10/22/14 2015  AST 30  ALT 32  ALKPHOS 82  BILITOT 0.4  PROT 6.7  ALBUMIN 4.4    Recent Labs  10/22/14 2015  LIPASE 22   CBC:  Recent Labs  10/22/14 2015 10/22/14 2049  WBC 13.2*  --   NEUTROABS 8.6*  --   HGB 13.9 14.6  HCT 42.8 43.0  MCV 93.9  --   PLT 283  --    Cardiac Enzymes:  Recent Labs  10/22/14 2010 10/23/14 0357 10/23/14 0945  TROPONINI <0.03 <0.03 <0.03   BNP: Invalid input(s): POCBNP D-Dimer: No results for input(s): DDIMER in the last  72 hours. Hemoglobin A1C: No results for input(s): HGBA1C in the last 72 hours. Fasting Lipid Panel:  Recent Labs  10/24/14 0554  CHOL 125  HDL 43  LDLCALC 60  TRIG 108  CHOLHDL 2.9   Thyroid Function Tests:  Recent Labs  10/22/14 2010  TSH 1.003   Anemia Panel: No results for input(s): VITAMINB12, FOLATE, FERRITIN, TIBC, IRON, RETICCTPCT in the last 72 hours.  Imaging: Dg Chest Port 1 View  10/22/2014   CLINICAL DATA:  74 year old female with chest pain  EXAM: PORTABLE CHEST - 1 VIEW  COMPARISON:  Radiograph dated 04/20/2014  FINDINGS: The heart size and mediastinal contours are within normal limits. Both lungs are clear. The visualized skeletal structures are unremarkable.  IMPRESSION: No active disease.   Electronically Signed   By: Anner Crete M.D.   On: 10/22/2014 20:46    Cardiac Studies:  ECG:  Orders placed or performed during the hospital encounter of 10/22/14  .  EKG 12-Lead  . EKG 12-Lead  . EKG     Telemetry: SR occasional PVC;s  10/24/2014   Echo:  04/12/14  EF 60-65%  No significant valve disease  Medications:   . aspirin EC  81 mg Oral Once per day on Mon Wed Fri  . enoxaparin (LOVENOX) injection  40 mg Subcutaneous Q24H  . metoprolol tartrate  25 mg Oral BID  . pravastatin  40 mg Oral QHS  . sodium chloride  3 mL Intravenous Q12H  . tacrolimus  1 mg Oral BID  . verapamil  240 mg Oral Daily       Assessment/Plan:  Palpitations:  Due to anxiety.  Improved continue low dose metroprolol.  See note from Dr Domenic Polite no further cardiac testing planned HTN:  Stable continue verapamil Chol:  On statin  Ok to d/c home f/u with Dr Leta Speller [facility] 10/24/2014, 9:06 AM

## 2014-10-31 DIAGNOSIS — I499 Cardiac arrhythmia, unspecified: Secondary | ICD-10-CM | POA: Diagnosis not present

## 2014-11-17 DIAGNOSIS — Z79899 Other long term (current) drug therapy: Secondary | ICD-10-CM | POA: Diagnosis not present

## 2014-11-17 DIAGNOSIS — Z794 Long term (current) use of insulin: Secondary | ICD-10-CM | POA: Diagnosis not present

## 2014-11-17 DIAGNOSIS — K769 Liver disease, unspecified: Secondary | ICD-10-CM | POA: Diagnosis not present

## 2014-12-09 DIAGNOSIS — H52223 Regular astigmatism, bilateral: Secondary | ICD-10-CM | POA: Diagnosis not present

## 2014-12-09 DIAGNOSIS — H25819 Combined forms of age-related cataract, unspecified eye: Secondary | ICD-10-CM | POA: Diagnosis not present

## 2014-12-09 DIAGNOSIS — H35369 Drusen (degenerative) of macula, unspecified eye: Secondary | ICD-10-CM | POA: Diagnosis not present

## 2014-12-09 DIAGNOSIS — H35363 Drusen (degenerative) of macula, bilateral: Secondary | ICD-10-CM | POA: Diagnosis not present

## 2014-12-09 DIAGNOSIS — H5203 Hypermetropia, bilateral: Secondary | ICD-10-CM | POA: Diagnosis not present

## 2014-12-22 ENCOUNTER — Ambulatory Visit (INDEPENDENT_AMBULATORY_CARE_PROVIDER_SITE_OTHER): Payer: Medicare Other | Admitting: Orthopedic Surgery

## 2014-12-22 VITALS — BP 164/91 | Ht 64.0 in | Wt 205.0 lb

## 2014-12-22 DIAGNOSIS — M1712 Unilateral primary osteoarthritis, left knee: Secondary | ICD-10-CM

## 2014-12-22 NOTE — Progress Notes (Signed)
Patient ID: Linda Barr, female   DOB: Sep 26, 1940, 74 y.o.   MRN: [id number]  Follow up visit  Chief Complaint  Patient presents with  . Follow-up    follow up left knee + repeat injection    BP 164/91 mmHg  Ht 5\' 4"  (1.626 m)  Wt 205 lb (92.987 kg)  BMI 35.17 kg/m2  No diagnosis found.  Patient requested injection left knee  Procedure note left knee injection verbal consent was obtained to inject left knee joint  Timeout was completed to confirm the site of injection  The medications used were 40 mg of Depo-Medrol and 1% lidocaine 3 cc  Anesthesia was provided by ethyl chloride and the skin was prepped with alcohol.  After cleaning the skin with alcohol a 20-gauge needle was used to inject the left knee joint. There were no complications. A sterile bandage was applied.

## 2014-12-29 DIAGNOSIS — Z944 Liver transplant status: Secondary | ICD-10-CM | POA: Diagnosis not present

## 2014-12-29 DIAGNOSIS — K769 Liver disease, unspecified: Secondary | ICD-10-CM | POA: Diagnosis not present

## 2014-12-29 DIAGNOSIS — Z79899 Other long term (current) drug therapy: Secondary | ICD-10-CM | POA: Diagnosis not present

## 2015-01-23 DIAGNOSIS — Z23 Encounter for immunization: Secondary | ICD-10-CM | POA: Diagnosis not present

## 2015-01-27 ENCOUNTER — Emergency Department (HOSPITAL_COMMUNITY)
Admission: EM | Admit: 2015-01-27 | Discharge: 2015-01-27 | Disposition: A | Discharge status: Home / Self Care | Payer: Medicare Other | Attending: Emergency Medicine | Admitting: Emergency Medicine

## 2015-01-27 ENCOUNTER — Encounter (HOSPITAL_COMMUNITY): Payer: Self-pay

## 2015-01-27 DIAGNOSIS — I10 Essential (primary) hypertension: Secondary | ICD-10-CM | POA: Diagnosis not present

## 2015-01-27 DIAGNOSIS — Z79899 Other long term (current) drug therapy: Secondary | ICD-10-CM | POA: Diagnosis not present

## 2015-01-27 DIAGNOSIS — E785 Hyperlipidemia, unspecified: Secondary | ICD-10-CM | POA: Insufficient documentation

## 2015-01-27 DIAGNOSIS — Z87891 Personal history of nicotine dependence: Secondary | ICD-10-CM | POA: Diagnosis not present

## 2015-01-27 DIAGNOSIS — Z87442 Personal history of urinary calculi: Secondary | ICD-10-CM | POA: Diagnosis not present

## 2015-01-27 DIAGNOSIS — Z944 Liver transplant status: Secondary | ICD-10-CM | POA: Diagnosis not present

## 2015-01-27 DIAGNOSIS — W268XXA Contact with other sharp object(s), not elsewhere classified, initial encounter: Secondary | ICD-10-CM | POA: Insufficient documentation

## 2015-01-27 DIAGNOSIS — Y9289 Other specified places as the place of occurrence of the external cause: Secondary | ICD-10-CM | POA: Diagnosis not present

## 2015-01-27 DIAGNOSIS — S61210A Laceration without foreign body of right index finger without damage to nail, initial encounter: Secondary | ICD-10-CM | POA: Diagnosis not present

## 2015-01-27 DIAGNOSIS — S61219A Laceration without foreign body of unspecified finger without damage to nail, initial encounter: Secondary | ICD-10-CM

## 2015-01-27 DIAGNOSIS — Y9389 Activity, other specified: Secondary | ICD-10-CM | POA: Diagnosis not present

## 2015-01-27 DIAGNOSIS — Y998 Other external cause status: Secondary | ICD-10-CM | POA: Diagnosis not present

## 2015-01-27 DIAGNOSIS — G8929 Other chronic pain: Secondary | ICD-10-CM | POA: Insufficient documentation

## 2015-01-27 DIAGNOSIS — Z23 Encounter for immunization: Secondary | ICD-10-CM | POA: Diagnosis not present

## 2015-01-27 DIAGNOSIS — Z7982 Long term (current) use of aspirin: Secondary | ICD-10-CM | POA: Insufficient documentation

## 2015-01-27 MED ORDER — TETANUS-DIPHTH-ACELL PERTUSSIS 5-2.5-18.5 LF-MCG/0.5 IM SUSP
0.5000 mL | Freq: Once | INTRAMUSCULAR | Status: AC
  Administered 2015-01-27: 0.5 mL via INTRAMUSCULAR
  Filled 2015-01-27: qty 0.5

## 2015-01-27 NOTE — ED Notes (Signed)
Pt reports cut the tip of her r index finger with a key this afternoon.  Reports took a while for the bleeding to stop but bleeding controlled at present.  Pt says thinks she needs a tetanus shot.

## 2015-01-27 NOTE — Discharge Instructions (Signed)
Sterile Tape Wound Care °Some cuts and wounds can be closed using sterile tape, also called skin adhesive strips. Skin adhesive strips can be used for shallow (superficial) and simple cuts, wounds, lacerations, and surgical incisions. These strips act in place of stitches to hold the edges of the wound together, allowing for faster healing. Unlike stitches, the adhesive strips do not require needles or anesthetic medicine for placement. The strips will wear off naturally as the wound is healing. It is important to take proper care of your wound at home while it heals.  °HOME CARE INSTRUCTIONS °· Try to keep the area around your wound clean and dry. Do not allow the adhesive strips to get wet for the first 12 hours.   °· Do not use any soaps or ointments on the wound for the first 12 hours.   °· If a bandage (dressing) has been applied, follow your health care provider's instructions for how often to change the dressing. Keep the dressing dry if one has been applied.   °· Do not remove the adhesive strips. They will fall off on their own. If they do not, you may remove them gently after 10 days. You should gently wet the strips before removing them. For example, this can be done in the shower. °· Do not scratch, pick, or rub the wound area.   °· Protect the wound from further injury until it is healed.   °· Protect the wound from sun and tanning bed exposure while it is healing and for several weeks after healing.   °· Only take over-the-counter or prescription medicines as directed by your health care provider.   °· Keep all follow-up appointments as directed by your health care provider.   °SEEK MEDICAL CARE IF: °Your adhesive strips become wet or soaked with blood before the wound has healed. The tape will need to be replaced.  °SEEK IMMEDIATE MEDICAL CARE IF: °· You have increasing pain in the wound.   °· You develop a rash after the strips are applied. °· Your wound becomes red, swollen, hot, or tender.   °· You  have a red streak that goes away from the wound.   °· You have pus coming from the wound.   °· You have increased bleeding from the wound. °· You notice a bad smell coming from the wound.   °· Your wound breaks open. °MAKE SURE YOU: °· Understand these instructions. °· Will watch your condition. °· Will get help right away if you are not doing well or get worse. °  °This information is not intended to replace advice given to you by your health care provider. Make sure you discuss any questions you have with your health care provider. °  °Document Released: 04/28/2004 Document Revised: 04/11/2014 Document Reviewed: 10/10/2012 °Elsevier Interactive Patient Education ©2016 [employer] ° °

## 2015-01-27 NOTE — ED Provider Notes (Signed)
CSN: [id number]     Arrival date & time 01/27/15  1505 History   First MD Initiated Contact with Patient 01/27/15 1530     Chief Complaint  Patient presents with  . Laceration     (Consider location/radiation/quality/duration/timing/severity/associated sxs/prior Treatment) HPI  Linda Barr is a 74 y.o. female who presents to the Emergency Department complaining of laceration to the end of her right index finger.  She states that she accidentally cut her finger on the sharp edge of a metal key.  She takes that she takes ASA, and was concerned that she couldn't control the bleeding initially, but finally stopped after direct pressure.  She denies pain, swelling or numbness.  Requests to have her TD updated.    Past Medical History  Diagnosis Date  . Essential hypertension   . Hx of liver transplant ([place])     1994, 1995 at [facility]  . Chronic back pain   . Palpitations   . Kidney stones   . Hyperlipidemia    Past Surgical History  Procedure Laterality Date  . Liver transplant    . Abdominal hysterectomy    . Back surgery    . Appendectomy     Family History  Problem Relation Age of Onset  . Stroke Mother   . Stroke Other    Social History  Substance Use Topics  . Smoking status: Former Smoker -- 1.00 packs/day    Types: Cigarettes    Quit date: 04/29/1990  . Smokeless tobacco: Never Used  . Alcohol Use: No   OB History    Gravida Para Term Preterm AB TAB SAB Ectopic Multiple Living   6 5 5  1  1   5      Review of Systems  Constitutional: Negative for fever and chills.  Musculoskeletal: Negative for back pain, joint swelling and arthralgias.  Skin: Positive for wound.       Laceration finger  Neurological: Negative for dizziness, weakness and numbness.  Hematological: Does not bruise/bleed easily.  All other systems reviewed and are negative.     Allergies  Codeine; Demerol; Dilaudid; and Morphine and related  Home Medications   Prior to Admission  medications   Medication Sig Start Date End Date Taking? Authorizing Provider  acetaminophen (TYLENOL) 500 MG tablet Take 500 mg by mouth every 6 (six) hours as needed for mild pain or moderate pain.   Yes Historical Provider, MD  aspirin 81 MG tablet Take 81 mg by mouth daily as needed (for palpitations).    Yes Historical Provider, MD  pravastatin (PRAVACHOL) 40 MG tablet Take 40 mg by mouth at bedtime.   Yes Historical Provider, MD  tacrolimus (PROGRAF) 1 MG capsule Take 1 mg by mouth 2 (two) times daily.   Yes Historical Provider, MD  verapamil (CALAN-SR) 240 MG CR tablet Take 240 mg by mouth daily.   Yes Historical Provider, MD   BP 109/74 mmHg  Pulse 104  Temp(Src) 97.7 F (36.5 C) (Oral)  Resp 18  Ht 5\' 2"  (1.575 m)  Wt 190 lb (86.183 kg)  BMI 34.74 kg/m2  SpO2 97%    Physical Exam  Constitutional: She is oriented to person, place, and time. She appears well-developed and well-nourished. No distress.  HENT:  Head: Normocephalic and atraumatic.  Cardiovascular: Normal rate, regular rhythm, normal heart sounds and intact distal pulses.   No murmur heard. Pulmonary/Chest: Effort normal and breath sounds normal. No respiratory distress.  Musculoskeletal: Normal range of motion. She exhibits no  edema or tenderness.  Neurological: She is alert and oriented to person, place, and time. She exhibits normal muscle tone. Coordination normal.  Skin: Skin is warm. Laceration noted.  superficial laceration to the distal right index finger.  Bleeding controlled.  No edema.  No nail injury.  Sensation intact  Nursing note and vitals reviewed.   ED Course  Procedures (including critical care time)  LACERATION REPAIR Performed by: Janard Culp L. Authorized by: Hale Bogus Consent: Verbal consent obtained. Risks and benefits: risks, benefits and alternatives were discussed Consent given by: patient Patient identity confirmed: provided demographic data Prepped and Draped in  normal sterile fashion Wound explored  Laceration Location: Right index finger  Laceration Length: 1 cm   No Foreign Bodies seen or palpated  Anesthesia: None    Irrigation method: syringe Amount of cleaning: standard  Skin closure: Steri-Strips   Number of Steri-Strips:  3  Technique: Topical application  Patient tolerance: Patient tolerated the procedure well with no immediate complications.   MDM   Final diagnoses:  Laceration of finger, initial encounter    Td updated. Bleeding controlled. Neurovascular intact. Superficial laceration to the distal right index finger, patient has full range of motion.  Wound was cleaned and Steri-Stripped. She was given return precautions for any signs of infection, Tylenol for pain.    Kem Parkinson, PA-C 01/27/15 1635  Tanna Furry, MD 02/04/15 [id number]

## 2015-01-30 DIAGNOSIS — K769 Liver disease, unspecified: Secondary | ICD-10-CM | POA: Diagnosis not present

## 2015-01-30 DIAGNOSIS — Z79899 Other long term (current) drug therapy: Secondary | ICD-10-CM | POA: Diagnosis not present

## 2015-01-30 DIAGNOSIS — Z944 Liver transplant status: Secondary | ICD-10-CM | POA: Diagnosis not present

## 2015-02-09 ENCOUNTER — Telehealth: Payer: Self-pay | Admitting: Orthopedic Surgery

## 2015-02-09 NOTE — Telephone Encounter (Signed)
Call received from patient and caregiver, Lucita Ferrara; states that left knee "still hurts" from time of last office visit 12/22/14; had injection at that time, which states has not helped much.  Caregiver came to phone and said that patient "can't even stand for a sheet to touch her knee.  Relayed that our schedule unfortunately does not have any immediate appointments available, and that what she is describing may not be orthopedic; therefore, recommend to contact primary care, Dr Everette Rank. Relayed I would enter a note to nurse for any further advice.  Ph#  [phone] (Home)

## 2015-02-09 NOTE — Telephone Encounter (Signed)
Agree with advice given

## 2015-03-02 DIAGNOSIS — Z79899 Other long term (current) drug therapy: Secondary | ICD-10-CM | POA: Diagnosis not present

## 2015-03-02 DIAGNOSIS — Z944 Liver transplant status: Secondary | ICD-10-CM | POA: Diagnosis not present

## 2015-03-02 DIAGNOSIS — K769 Liver disease, unspecified: Secondary | ICD-10-CM | POA: Diagnosis not present

## 2015-03-30 ENCOUNTER — Emergency Department (HOSPITAL_COMMUNITY): Payer: Medicare Other

## 2015-03-30 ENCOUNTER — Encounter (HOSPITAL_COMMUNITY): Payer: Self-pay | Admitting: *Deleted

## 2015-03-30 ENCOUNTER — Emergency Department (HOSPITAL_COMMUNITY)
Admission: EM | Admit: 2015-03-30 | Discharge: 2015-03-30 | Disposition: A | Discharge status: Home / Self Care | Payer: Medicare Other | Attending: Emergency Medicine | Admitting: Emergency Medicine

## 2015-03-30 DIAGNOSIS — Z944 Liver transplant status: Secondary | ICD-10-CM | POA: Insufficient documentation

## 2015-03-30 DIAGNOSIS — J209 Acute bronchitis, unspecified: Secondary | ICD-10-CM | POA: Diagnosis not present

## 2015-03-30 DIAGNOSIS — Z79899 Other long term (current) drug therapy: Secondary | ICD-10-CM | POA: Diagnosis not present

## 2015-03-30 DIAGNOSIS — R05 Cough: Secondary | ICD-10-CM | POA: Diagnosis not present

## 2015-03-30 DIAGNOSIS — I10 Essential (primary) hypertension: Secondary | ICD-10-CM | POA: Diagnosis not present

## 2015-03-30 DIAGNOSIS — Z87442 Personal history of urinary calculi: Secondary | ICD-10-CM | POA: Diagnosis not present

## 2015-03-30 DIAGNOSIS — E785 Hyperlipidemia, unspecified: Secondary | ICD-10-CM | POA: Diagnosis not present

## 2015-03-30 DIAGNOSIS — Z7982 Long term (current) use of aspirin: Secondary | ICD-10-CM | POA: Insufficient documentation

## 2015-03-30 DIAGNOSIS — Z87891 Personal history of nicotine dependence: Secondary | ICD-10-CM | POA: Insufficient documentation

## 2015-03-30 DIAGNOSIS — G8929 Other chronic pain: Secondary | ICD-10-CM | POA: Insufficient documentation

## 2015-03-30 MED ORDER — TRAMADOL HCL 50 MG PO TABS
50.0000 mg | ORAL_TABLET | Freq: Once | ORAL | Status: AC
  Administered 2015-03-30: 50 mg via ORAL
  Filled 2015-03-30: qty 1

## 2015-03-30 MED ORDER — ALBUTEROL SULFATE HFA 108 (90 BASE) MCG/ACT IN AERS
2.0000 | INHALATION_SPRAY | Freq: Once | RESPIRATORY_TRACT | Status: AC
  Administered 2015-03-30: 2 via RESPIRATORY_TRACT
  Filled 2015-03-30: qty 6.7

## 2015-03-30 MED ORDER — AZITHROMYCIN 250 MG PO TABS
500.0000 mg | ORAL_TABLET | Freq: Once | ORAL | Status: AC
  Administered 2015-03-30: 500 mg via ORAL
  Filled 2015-03-30: qty 2

## 2015-03-30 MED ORDER — AZITHROMYCIN 250 MG PO TABS: ORAL_TABLET | ORAL | Status: DC

## 2015-03-30 MED ORDER — TRAMADOL HCL 50 MG PO TABS: 50.0000 mg | ORAL_TABLET | Freq: Two times a day (BID) | ORAL | Status: DC | PRN

## 2015-03-30 NOTE — ED Provider Notes (Signed)
CSN: [id number]     Arrival date & time 03/30/15  1703 History   First MD Initiated Contact with Patient 03/30/15 2016     Chief Complaint  Patient presents with  . Cough    Patient is a 74 y.o. female presenting with cough. The history is provided by the patient and a relative.  Cough Cough characteristics: non-bloody. Severity:  Moderate Onset quality:  Gradual Duration:  4 days Timing:  Intermittent Progression:  Worsening Chronicity:  New Smoker: no   Relieved by:  Nothing Worsened by:  Nothing tried Associated symptoms: sinus congestion   Associated symptoms: no chest pain, no fever and no shortness of breath   Associated symptoms comment:  Back pain due to cough  pt reports cough for 4 days.  No hemoptysis No CP No SOB She reports back pain due to coughing tmax less than 100 No vomiting/diarrhea  She is s/p liver transplant She called her specialist at [facility] who advised an ER visit  She reports recent lab work in past month that was unremarkable  Past Medical History  Diagnosis Date  . Essential hypertension   . Hx of liver transplant ([place])     1994, 1995 at [facility]  . Chronic back pain   . Palpitations   . Kidney stones   . Hyperlipidemia    Past Surgical History  Procedure Laterality Date  . Liver transplant    . Abdominal hysterectomy    . Back surgery    . Appendectomy     Family History  Problem Relation Age of Onset  . Stroke Mother   . Stroke Other    Social History  Substance Use Topics  . Smoking status: Former Smoker -- 1.00 packs/day    Types: Cigarettes    Quit date: 04/29/1990  . Smokeless tobacco: Never Used  . Alcohol Use: No   OB History    Gravida Para Term Preterm AB TAB SAB Ectopic Multiple Living   6 5 5  1  1   5      Review of Systems  Constitutional: Negative for fever.  Respiratory: Positive for cough. Negative for shortness of breath.   Cardiovascular: Negative for chest pain.  Gastrointestinal: Negative for  vomiting.  All other systems reviewed and are negative.     Allergies  Codeine; Demerol; Dilaudid; and Morphine and related  Home Medications   Prior to Admission medications   Medication Sig Start Date End Date Taking? Authorizing Provider  acetaminophen (TYLENOL) 500 MG tablet Take 500 mg by mouth every 6 (six) hours as needed for mild pain or moderate pain.    Historical Provider, MD  aspirin 81 MG tablet Take 81 mg by mouth daily as needed (for palpitations).     Historical Provider, MD  pravastatin (PRAVACHOL) 40 MG tablet Take 40 mg by mouth at bedtime.    Historical Provider, MD  tacrolimus (PROGRAF) 1 MG capsule Take 1 mg by mouth 2 (two) times daily.    Historical Provider, MD  verapamil (CALAN-SR) 240 MG CR tablet Take 240 mg by mouth daily.    Historical Provider, MD   BP 213/109 mmHg  Pulse 72  Temp(Src) 98.3 F (36.8 C) (Oral)  Resp 18  Ht 5\' 4"  (1.626 m)  Wt 86.183 kg  BMI 32.60 kg/m2  SpO2 97% Physical Exam CONSTITUTIONAL: Well developed/well nourished, coughs frequently during exam HEAD: Normocephalic/atraumatic EYES: EOMI ENMT: Mucous membranes moist NECK: supple no meningeal signs SPINE/BACK:entire spine nontender CV: S1/S2 noted, no murmurs/rubs/gallops  noted LUNGS: Lungs are clear to auscultation bilaterally, no apparent distress ABDOMEN: soft, nontender NEURO: Pt is awake/alert/appropriate, moves all extremitiesx4.  No facial droop.   EXTREMITIES: pulses normal/equal, full ROM SKIN: warm, color normal PSYCH: no abnormalities of mood noted, alert and oriented to situation  ED Course  Procedures  Pt well appearing Lungs clear However due to h/o liver transplant and high risk for infection will start on Zpack Albuterol for cough She reports only pain meds she take is tramadol 9:45 PM Pt well appearing BP elevated but no signs of acute hypertensive emergency I feel she is safe for d/c home I feel risk of adding on zpack outweighed by benefit as  she is high risk for worsening due to h/o liver transplant  BP 195/111 mmHg  Pulse 102  Temp(Src) 98.3 F (36.8 C) (Oral)  Resp 18  Ht 5\' 4"  (1.626 m)  Wt 86.183 kg  BMI 32.60 kg/m2  SpO2 96%  Imaging Review Dg Chest 2 View  03/30/2015  CLINICAL DATA:  Productive cough.  Liver transplant EXAM: CHEST  2 VIEW COMPARISON:  10/22/2014 FINDINGS: Heart size upper normal.  Negative for heart failure. Lungs are clear without infiltrate or effusion. Negative for mass lesion. IMPRESSION: No active cardiopulmonary disease. Electronically Signed   By: Franchot Gallo M.D.   On: 03/30/2015 18:17   I have personally reviewed and evaluated these images results as part of my medical decision-making.  Medications  albuterol (PROVENTIL HFA;VENTOLIN HFA) 108 (90 BASE) MCG/ACT inhaler 2 puff (2 puffs Inhalation Given 03/30/15 2114)  azithromycin (ZITHROMAX) tablet 500 mg (500 mg Oral Given 03/30/15 2106)  traMADol (ULTRAM) tablet 50 mg (50 mg Oral Given 03/30/15 2106)     MDM   Final diagnoses:  Acute bronchitis, unspecified organism    Nursing notes including past medical history and social history reviewed and considered in documentation xrays/imaging reviewed by myself and considered during evaluation     Ripley Fraise, MD 03/30/15 2146

## 2015-03-30 NOTE — Discharge Instructions (Signed)

## 2015-03-30 NOTE — ED Notes (Addendum)
Pt comes in with cough starting 4 days ago, cough is productive with clear sputum. Denies n/v/d.    Also, pt is having back pain that has worsened.

## 2015-03-30 NOTE — ED Notes (Signed)
Pt has hx of high blood pressure and states she has been taking her medication. NAD noted. Pt denies any head pain or weakness.

## 2015-05-01 DIAGNOSIS — K769 Liver disease, unspecified: Secondary | ICD-10-CM | POA: Diagnosis not present

## 2015-05-01 DIAGNOSIS — Z944 Liver transplant status: Secondary | ICD-10-CM | POA: Diagnosis not present

## 2015-05-01 DIAGNOSIS — Z79899 Other long term (current) drug therapy: Secondary | ICD-10-CM | POA: Diagnosis not present

## 2015-05-15 DIAGNOSIS — E6609 Other obesity due to excess calories: Secondary | ICD-10-CM | POA: Diagnosis not present

## 2015-05-15 DIAGNOSIS — I10 Essential (primary) hypertension: Secondary | ICD-10-CM | POA: Diagnosis not present

## 2015-05-15 DIAGNOSIS — R05 Cough: Secondary | ICD-10-CM | POA: Diagnosis not present

## 2015-05-15 DIAGNOSIS — Z949 Transplanted organ and tissue status, unspecified: Secondary | ICD-10-CM | POA: Diagnosis not present

## 2015-05-15 DIAGNOSIS — K580 Irritable bowel syndrome with diarrhea: Secondary | ICD-10-CM | POA: Diagnosis not present

## 2015-05-15 DIAGNOSIS — Z944 Liver transplant status: Secondary | ICD-10-CM | POA: Diagnosis not present

## 2015-05-15 DIAGNOSIS — E669 Obesity, unspecified: Secondary | ICD-10-CM | POA: Diagnosis not present

## 2015-05-19 DIAGNOSIS — E6609 Other obesity due to excess calories: Secondary | ICD-10-CM | POA: Diagnosis not present

## 2015-05-19 DIAGNOSIS — Z944 Liver transplant status: Secondary | ICD-10-CM | POA: Diagnosis not present

## 2015-05-19 DIAGNOSIS — E669 Obesity, unspecified: Secondary | ICD-10-CM | POA: Diagnosis not present

## 2015-05-19 DIAGNOSIS — I10 Essential (primary) hypertension: Secondary | ICD-10-CM | POA: Diagnosis not present

## 2015-05-19 DIAGNOSIS — K580 Irritable bowel syndrome with diarrhea: Secondary | ICD-10-CM | POA: Diagnosis not present

## 2015-05-19 DIAGNOSIS — R05 Cough: Secondary | ICD-10-CM | POA: Diagnosis not present

## 2015-05-19 DIAGNOSIS — Z949 Transplanted organ and tissue status, unspecified: Secondary | ICD-10-CM | POA: Diagnosis not present

## 2015-05-29 DIAGNOSIS — K769 Liver disease, unspecified: Secondary | ICD-10-CM | POA: Diagnosis not present

## 2015-05-29 DIAGNOSIS — Z944 Liver transplant status: Secondary | ICD-10-CM | POA: Diagnosis not present

## 2015-05-29 DIAGNOSIS — Z79899 Other long term (current) drug therapy: Secondary | ICD-10-CM | POA: Diagnosis not present

## 2015-06-26 DIAGNOSIS — I10 Essential (primary) hypertension: Secondary | ICD-10-CM | POA: Diagnosis not present

## 2015-06-26 DIAGNOSIS — K580 Irritable bowel syndrome with diarrhea: Secondary | ICD-10-CM | POA: Diagnosis not present

## 2015-06-26 DIAGNOSIS — E669 Obesity, unspecified: Secondary | ICD-10-CM | POA: Diagnosis not present

## 2015-06-26 DIAGNOSIS — Z944 Liver transplant status: Secondary | ICD-10-CM | POA: Diagnosis not present

## 2015-06-26 DIAGNOSIS — E6609 Other obesity due to excess calories: Secondary | ICD-10-CM | POA: Diagnosis not present

## 2015-06-26 DIAGNOSIS — R05 Cough: Secondary | ICD-10-CM | POA: Diagnosis not present

## 2015-06-26 DIAGNOSIS — Z949 Transplanted organ and tissue status, unspecified: Secondary | ICD-10-CM | POA: Diagnosis not present

## 2015-07-10 DIAGNOSIS — Z79899 Other long term (current) drug therapy: Secondary | ICD-10-CM | POA: Diagnosis not present

## 2015-07-10 DIAGNOSIS — Z944 Liver transplant status: Secondary | ICD-10-CM | POA: Diagnosis not present

## 2015-07-10 DIAGNOSIS — K769 Liver disease, unspecified: Secondary | ICD-10-CM | POA: Diagnosis not present

## 2015-09-04 DIAGNOSIS — Z79899 Other long term (current) drug therapy: Secondary | ICD-10-CM | POA: Diagnosis not present

## 2015-09-04 DIAGNOSIS — K769 Liver disease, unspecified: Secondary | ICD-10-CM | POA: Diagnosis not present

## 2015-09-04 DIAGNOSIS — Z944 Liver transplant status: Secondary | ICD-10-CM | POA: Diagnosis not present

## 2015-09-30 ENCOUNTER — Other Ambulatory Visit (HOSPITAL_COMMUNITY): Payer: Self-pay | Admitting: Internal Medicine

## 2015-09-30 DIAGNOSIS — Z1231 Encounter for screening mammogram for malignant neoplasm of breast: Secondary | ICD-10-CM

## 2015-10-19 ENCOUNTER — Ambulatory Visit (HOSPITAL_COMMUNITY)
Admission: RE | Admit: 2015-10-19 | Discharge: 2015-10-19 | Disposition: A | Discharge status: Home / Self Care | Payer: Medicare Other | Source: Ambulatory Visit | Attending: Internal Medicine | Admitting: Internal Medicine

## 2015-10-19 DIAGNOSIS — Z1231 Encounter for screening mammogram for malignant neoplasm of breast: Secondary | ICD-10-CM | POA: Insufficient documentation

## 2015-10-26 DIAGNOSIS — K769 Liver disease, unspecified: Secondary | ICD-10-CM | POA: Diagnosis not present

## 2015-10-26 DIAGNOSIS — Z4823 Encounter for aftercare following liver transplant: Secondary | ICD-10-CM | POA: Diagnosis not present

## 2015-10-26 DIAGNOSIS — Z79899 Other long term (current) drug therapy: Secondary | ICD-10-CM | POA: Diagnosis not present

## 2015-11-16 DIAGNOSIS — M5440 Lumbago with sciatica, unspecified side: Secondary | ICD-10-CM | POA: Diagnosis not present

## 2015-11-16 DIAGNOSIS — M549 Dorsalgia, unspecified: Secondary | ICD-10-CM | POA: Diagnosis not present

## 2015-11-16 DIAGNOSIS — Z5181 Encounter for therapeutic drug level monitoring: Secondary | ICD-10-CM | POA: Diagnosis not present

## 2015-11-16 DIAGNOSIS — Z944 Liver transplant status: Secondary | ICD-10-CM | POA: Diagnosis not present

## 2015-11-16 DIAGNOSIS — Z4823 Encounter for aftercare following liver transplant: Secondary | ICD-10-CM | POA: Diagnosis not present

## 2015-11-16 DIAGNOSIS — K769 Liver disease, unspecified: Secondary | ICD-10-CM | POA: Diagnosis not present

## 2015-11-16 DIAGNOSIS — B1920 Unspecified viral hepatitis C without hepatic coma: Secondary | ICD-10-CM | POA: Diagnosis not present

## 2015-11-16 DIAGNOSIS — G8929 Other chronic pain: Secondary | ICD-10-CM | POA: Diagnosis not present

## 2015-11-16 DIAGNOSIS — Z79899 Other long term (current) drug therapy: Secondary | ICD-10-CM | POA: Diagnosis not present

## 2015-11-23 DIAGNOSIS — M25551 Pain in right hip: Secondary | ICD-10-CM | POA: Diagnosis not present

## 2015-11-23 DIAGNOSIS — G8929 Other chronic pain: Secondary | ICD-10-CM | POA: Diagnosis not present

## 2015-11-23 DIAGNOSIS — M961 Postlaminectomy syndrome, not elsewhere classified: Secondary | ICD-10-CM | POA: Diagnosis not present

## 2015-11-27 DIAGNOSIS — M1611 Unilateral primary osteoarthritis, right hip: Secondary | ICD-10-CM | POA: Diagnosis not present

## 2015-12-11 DIAGNOSIS — M47816 Spondylosis without myelopathy or radiculopathy, lumbar region: Secondary | ICD-10-CM | POA: Diagnosis not present

## 2015-12-11 DIAGNOSIS — M5127 Other intervertebral disc displacement, lumbosacral region: Secondary | ICD-10-CM | POA: Diagnosis not present

## 2015-12-11 DIAGNOSIS — M5126 Other intervertebral disc displacement, lumbar region: Secondary | ICD-10-CM | POA: Diagnosis not present

## 2015-12-11 DIAGNOSIS — Z981 Arthrodesis status: Secondary | ICD-10-CM | POA: Diagnosis not present

## 2015-12-11 DIAGNOSIS — M5125 Other intervertebral disc displacement, thoracolumbar region: Secondary | ICD-10-CM | POA: Diagnosis not present

## 2015-12-18 DIAGNOSIS — K769 Liver disease, unspecified: Secondary | ICD-10-CM | POA: Diagnosis not present

## 2015-12-18 DIAGNOSIS — Z79899 Other long term (current) drug therapy: Secondary | ICD-10-CM | POA: Diagnosis not present

## 2015-12-18 DIAGNOSIS — Z944 Liver transplant status: Secondary | ICD-10-CM | POA: Diagnosis not present

## 2015-12-22 DIAGNOSIS — M961 Postlaminectomy syndrome, not elsewhere classified: Secondary | ICD-10-CM | POA: Diagnosis not present

## 2015-12-22 DIAGNOSIS — G8929 Other chronic pain: Secondary | ICD-10-CM | POA: Diagnosis not present

## 2015-12-22 DIAGNOSIS — M25551 Pain in right hip: Secondary | ICD-10-CM | POA: Diagnosis not present

## 2016-01-12 DIAGNOSIS — M7061 Trochanteric bursitis, right hip: Secondary | ICD-10-CM | POA: Diagnosis not present

## 2016-01-12 DIAGNOSIS — M25551 Pain in right hip: Secondary | ICD-10-CM | POA: Diagnosis not present

## 2016-01-12 DIAGNOSIS — G8929 Other chronic pain: Secondary | ICD-10-CM | POA: Diagnosis not present

## 2016-01-22 DIAGNOSIS — Z79899 Other long term (current) drug therapy: Secondary | ICD-10-CM | POA: Diagnosis not present

## 2016-01-22 DIAGNOSIS — Z944 Liver transplant status: Secondary | ICD-10-CM | POA: Diagnosis not present

## 2016-01-22 DIAGNOSIS — K769 Liver disease, unspecified: Secondary | ICD-10-CM | POA: Diagnosis not present

## 2016-02-05 DIAGNOSIS — H524 Presbyopia: Secondary | ICD-10-CM | POA: Diagnosis not present

## 2016-02-05 DIAGNOSIS — H5203 Hypermetropia, bilateral: Secondary | ICD-10-CM | POA: Diagnosis not present

## 2016-02-05 DIAGNOSIS — H3540 Unspecified peripheral retinal degeneration: Secondary | ICD-10-CM | POA: Diagnosis not present

## 2016-02-05 DIAGNOSIS — H52223 Regular astigmatism, bilateral: Secondary | ICD-10-CM | POA: Diagnosis not present

## 2016-02-16 DIAGNOSIS — R51 Headache: Secondary | ICD-10-CM | POA: Diagnosis not present

## 2016-02-16 DIAGNOSIS — R0981 Nasal congestion: Secondary | ICD-10-CM | POA: Diagnosis not present

## 2016-02-16 DIAGNOSIS — R05 Cough: Secondary | ICD-10-CM | POA: Diagnosis not present

## 2016-02-16 DIAGNOSIS — R509 Fever, unspecified: Secondary | ICD-10-CM | POA: Diagnosis not present

## 2016-02-16 DIAGNOSIS — I10 Essential (primary) hypertension: Secondary | ICD-10-CM | POA: Diagnosis not present

## 2016-03-01 DIAGNOSIS — Z79899 Other long term (current) drug therapy: Secondary | ICD-10-CM | POA: Diagnosis not present

## 2016-03-01 DIAGNOSIS — K769 Liver disease, unspecified: Secondary | ICD-10-CM | POA: Diagnosis not present

## 2016-03-01 DIAGNOSIS — Z944 Liver transplant status: Secondary | ICD-10-CM | POA: Diagnosis not present

## 2016-03-08 DIAGNOSIS — Z23 Encounter for immunization: Secondary | ICD-10-CM | POA: Diagnosis not present

## 2016-04-01 DIAGNOSIS — Z79899 Other long term (current) drug therapy: Secondary | ICD-10-CM | POA: Diagnosis not present

## 2016-04-01 DIAGNOSIS — K769 Liver disease, unspecified: Secondary | ICD-10-CM | POA: Diagnosis not present

## 2016-04-01 DIAGNOSIS — Z944 Liver transplant status: Secondary | ICD-10-CM | POA: Diagnosis not present

## 2016-04-29 DIAGNOSIS — K769 Liver disease, unspecified: Secondary | ICD-10-CM | POA: Diagnosis not present

## 2016-04-29 DIAGNOSIS — Z79899 Other long term (current) drug therapy: Secondary | ICD-10-CM | POA: Diagnosis not present

## 2016-04-29 DIAGNOSIS — Z944 Liver transplant status: Secondary | ICD-10-CM | POA: Diagnosis not present

## 2016-05-09 DIAGNOSIS — H35363 Drusen (degenerative) of macula, bilateral: Secondary | ICD-10-CM | POA: Diagnosis not present

## 2016-05-09 DIAGNOSIS — H2512 Age-related nuclear cataract, left eye: Secondary | ICD-10-CM | POA: Diagnosis not present

## 2016-05-09 DIAGNOSIS — H2513 Age-related nuclear cataract, bilateral: Secondary | ICD-10-CM | POA: Diagnosis not present

## 2016-05-23 DIAGNOSIS — D225 Melanocytic nevi of trunk: Secondary | ICD-10-CM | POA: Diagnosis not present

## 2016-05-23 DIAGNOSIS — L918 Other hypertrophic disorders of the skin: Secondary | ICD-10-CM | POA: Diagnosis not present

## 2016-05-24 IMAGING — CR DG CHEST 1V PORT
1 series · 1 of 1 positions shown · non-contrast
Comparison: Radiograph dated 04/20/2014

CLINICAL DATA: 74-year-old female with chest pain

EXAM:
PORTABLE CHEST - 1 VIEW

[ap]
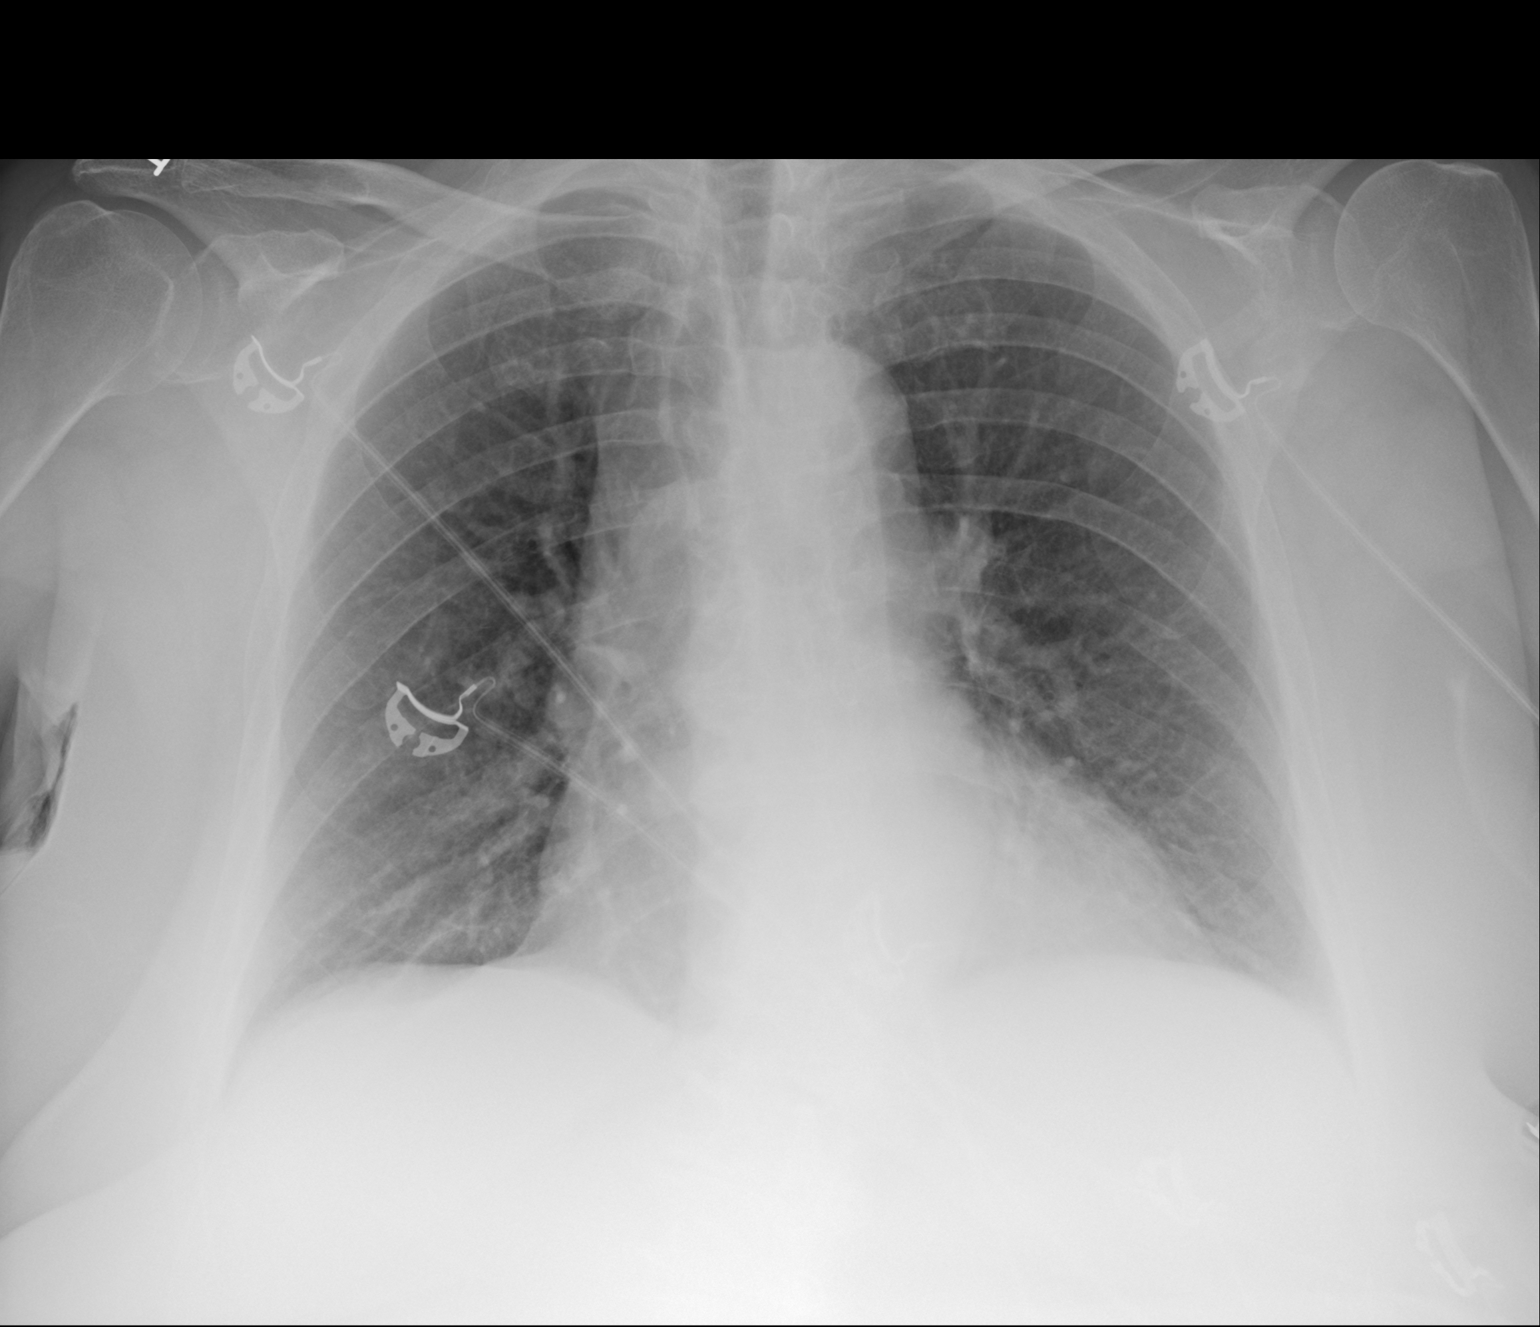

[1 of 1 positions shown; findings below may reference images not displayed]

FINDINGS: The heart size and mediastinal contours are within normal limits.
Both lungs are clear. The visualized skeletal structures are
unremarkable.
IMPRESSION: No active disease.

## 2016-06-02 NOTE — Patient Instructions (Signed)
Your procedure is scheduled on: 06/13/2016  Report to [facility] at  75  AM.  Call this number if you have problems the morning of surgery: [phone]   Do not eat food or drink liquids :After Midnight.      Take these medicines the morning of surgery with A SIP OF WATER: lisinopril, verapamil, prograf.   Do not wear jewelry, make-up or nail polish.  Do not wear lotions, powders, or perfumes. You may wear deodorant.  Do not shave 48 hours prior to surgery.  Do not bring valuables to the hospital.  Contacts, dentures or bridgework may not be worn into surgery.  Leave suitcase in the car. After surgery it may be brought to your room.  For patients admitted to the hospital, checkout time is 11:00 AM the day of discharge.   Patients discharged the day of surgery will not be allowed to drive home.  :     Please read over the following fact sheets that you were given: Coughing and Deep Breathing, Surgical Site Infection Prevention, Anesthesia Post-op Instructions and Care and Recovery After Surgery    Cataract A cataract is a clouding of the lens of the eye. When a lens becomes cloudy, vision is reduced based on the degree and nature of the clouding. Many cataracts reduce vision to some degree. Some cataracts make people more near-sighted as they develop. Other cataracts increase glare. Cataracts that are ignored and become worse can sometimes look white. The white color can be seen through the pupil. CAUSES   Aging. However, cataracts may occur at any age, even in newborns.   Certain drugs.   Trauma to the eye.   Certain diseases such as diabetes.   Specific eye diseases such as chronic inflammation inside the eye or a sudden attack of a rare form of glaucoma.   Inherited or acquired medical problems.  SYMPTOMS   Gradual, progressive drop in vision in the affected eye.   Severe, rapid visual loss. This most often happens when trauma is the cause.  DIAGNOSIS  To detect a  cataract, an eye doctor examines the lens. Cataracts are best diagnosed with an exam of the eyes with the pupils enlarged (dilated) by drops.  TREATMENT  For an early cataract, vision may improve by using different eyeglasses or stronger lighting. If that does not help your vision, surgery is the only effective treatment. A cataract needs to be surgically removed when vision loss interferes with your everyday activities, such as driving, reading, or watching TV. A cataract may also have to be removed if it prevents examination or treatment of another eye problem. Surgery removes the cloudy lens and usually replaces it with a substitute lens (intraocular lens, IOL).  At a time when both you and your doctor agree, the cataract will be surgically removed. If you have cataracts in both eyes, only one is usually removed at a time. This allows the operated eye to heal and be out of danger from any possible problems after surgery (such as infection or poor wound healing). In rare cases, a cataract may be doing damage to your eye. In these cases, your caregiver may advise surgical removal right away. The vast majority of people who have cataract surgery have better vision afterward. HOME CARE INSTRUCTIONS  If you are not planning surgery, you may be asked to do the following:  Use different eyeglasses.   Use stronger or brighter lighting.   Ask your eye doctor about reducing your medicine  dose or changing medicines if it is thought that a medicine caused your cataract. Changing medicines does not make the cataract go away on its own.   Become familiar with your surroundings. Poor vision can lead to injury. Avoid bumping into things on the affected side. You are at a higher risk for tripping or falling.   Exercise extreme care when driving or operating machinery.   Wear sunglasses if you are sensitive to bright light or experiencing problems with glare.  SEEK IMMEDIATE MEDICAL CARE IF:   You have a  worsening or sudden vision loss.   You notice redness, swelling, or increasing pain in the eye.   You have a fever.  Document Released: 03/21/2005 Document Revised: 03/10/2011 Document Reviewed: 11/12/2010 [facility] Patient Information 2012 [place].PATIENT INSTRUCTIONS POST-ANESTHESIA  IMMEDIATELY FOLLOWING SURGERY:  Do not drive or operate machinery for the first twenty four hours after surgery.  Do not make any important decisions for twenty four hours after surgery or while taking narcotic pain medications or sedatives.  If you develop intractable nausea and vomiting or a severe headache please notify your doctor immediately.  FOLLOW-UP:  Please make an appointment with your surgeon as instructed. You do not need to follow up with anesthesia unless specifically instructed to do so.  WOUND CARE INSTRUCTIONS (if applicable):  Keep a dry clean dressing on the anesthesia/puncture wound site if there is drainage.  Once the wound has quit draining you may leave it open to air.  Generally you should leave the bandage intact for twenty four hours unless there is drainage.  If the epidural site drains for more than 36-48 hours please call the anesthesia department.  QUESTIONS?:  Please feel free to call your physician or the hospital operator if you have any questions, and they will be happy to assist you.

## 2016-06-06 ENCOUNTER — Encounter (HOSPITAL_COMMUNITY): Payer: Self-pay

## 2016-06-06 ENCOUNTER — Encounter (HOSPITAL_COMMUNITY)
Admission: RE | Admit: 2016-06-06 | Discharge: 2016-06-06 | Disposition: A | Discharge status: Home / Self Care | Payer: Medicare Other | Source: Ambulatory Visit | Attending: Ophthalmology | Admitting: Ophthalmology

## 2016-06-06 DIAGNOSIS — Z01818 Encounter for other preprocedural examination: Secondary | ICD-10-CM | POA: Insufficient documentation

## 2016-06-06 HISTORY — DX: Unspecified osteoarthritis, unspecified site: M19.90

## 2016-06-06 HISTORY — DX: Gout, unspecified: M10.9

## 2016-06-06 HISTORY — DX: Anxiety disorder, unspecified: F41.9

## 2016-06-06 HISTORY — DX: Personal history of urinary calculi: Z87.442

## 2016-06-06 LAB — CBC WITH DIFFERENTIAL/PLATELET
BASOS ABS: 0 10*3/uL (ref 0.0–0.1)
Basophils Relative: 0 %
EOS ABS: 0.3 10*3/uL (ref 0.0–0.7)
Eosinophils Relative: 2 %
HCT: 45.2 % (ref 36.0–46.0)
HEMOGLOBIN: 14.3 g/dL (ref 12.0–15.0)
Lymphocytes Relative: 29 %
Lymphs Abs: 3.5 10*3/uL (ref 0.7–4.0)
MCH: 30.4 pg (ref 26.0–34.0)
MCHC: 31.6 g/dL (ref 30.0–36.0)
MCV: 96 fL (ref 78.0–100.0)
Monocytes Absolute: 1 10*3/uL (ref 0.1–1.0)
Monocytes Relative: 8 %
NEUTROS ABS: 7.1 10*3/uL (ref 1.7–7.7)
NEUTROS PCT: 61 %
Platelets: 253 10*3/uL (ref 150–400)
RBC: 4.71 MIL/uL (ref 3.87–5.11)
RDW: 13 % (ref 11.5–15.5)
WBC: 11.9 10*3/uL — AB (ref 4.0–10.5)

## 2016-06-06 LAB — BASIC METABOLIC PANEL
ANION GAP: 8 (ref 5–15)
BUN: 23 mg/dL — ABNORMAL HIGH (ref 6–20)
CHLORIDE: 105 mmol/L (ref 101–111)
CO2: 25 mmol/L (ref 22–32)
CREATININE: 0.92 mg/dL (ref 0.44–1.00)
Calcium: 9.4 mg/dL (ref 8.9–10.3)
GFR calc non Af Amer: 59 mL/min — ABNORMAL LOW (ref >60)
Glucose, Bld: 119 mg/dL — ABNORMAL HIGH (ref 65–99)
Potassium: 4.2 mmol/L (ref 3.5–5.1)
SODIUM: 138 mmol/L (ref 135–145)

## 2016-06-06 NOTE — Pre-Procedure Instructions (Signed)
EKG shows bigeminy which is the same when compared to last EKG. Dr Patsey Berthold aware and no orders given.

## 2016-06-07 DIAGNOSIS — Z79899 Other long term (current) drug therapy: Secondary | ICD-10-CM | POA: Diagnosis not present

## 2016-06-07 DIAGNOSIS — K769 Liver disease, unspecified: Secondary | ICD-10-CM | POA: Diagnosis not present

## 2016-06-07 DIAGNOSIS — Z944 Liver transplant status: Secondary | ICD-10-CM | POA: Diagnosis not present

## 2016-06-08 ENCOUNTER — Encounter (HOSPITAL_COMMUNITY): Payer: Self-pay | Admitting: *Deleted

## 2016-06-22 ENCOUNTER — Encounter (HOSPITAL_COMMUNITY)
Admission: RE | Admit: 2016-06-22 | Discharge: 2016-06-22 | Disposition: A | Discharge status: Home / Self Care | Payer: Medicare Other | Source: Ambulatory Visit | Attending: Ophthalmology | Admitting: Ophthalmology

## 2016-06-22 ENCOUNTER — Encounter (HOSPITAL_COMMUNITY): Payer: Self-pay

## 2016-06-22 MED ORDER — TETRACAINE HCL 0.5 % OP SOLN: 1.0000 [drp] | OPHTHALMIC | Status: AC

## 2016-06-22 MED ORDER — PHENYLEPHRINE HCL 2.5 % OP SOLN
1.0000 [drp] | OPHTHALMIC | Status: AC
Start: 2016-06-22 — End: 2016-06-22

## 2016-06-22 MED ORDER — CYCLOPENTOLATE-PHENYLEPHRINE 0.2-1 % OP SOLN: 1.0000 [drp] | OPHTHALMIC | Status: AC

## 2016-06-22 MED ORDER — LIDOCAINE HCL 3.5 % OP GEL: 1.0000 "application " | Freq: Once | OPHTHALMIC | Status: DC

## 2016-06-27 ENCOUNTER — Encounter (HOSPITAL_COMMUNITY)
Admission: RE | Disposition: A | Discharge status: Home / Self Care | Payer: Self-pay | Source: Ambulatory Visit | Attending: Ophthalmology

## 2016-06-27 ENCOUNTER — Ambulatory Visit (HOSPITAL_COMMUNITY)
Admission: RE | Admit: 2016-06-27 | Discharge: 2016-06-27 | Disposition: A | Discharge status: Home / Self Care | Payer: Medicare Other | Source: Ambulatory Visit | Attending: Ophthalmology | Admitting: Ophthalmology

## 2016-06-27 ENCOUNTER — Encounter (HOSPITAL_COMMUNITY): Payer: Self-pay | Admitting: Ophthalmology

## 2016-06-27 ENCOUNTER — Ambulatory Visit (HOSPITAL_COMMUNITY): Payer: Medicare Other | Admitting: Anesthesiology

## 2016-06-27 DIAGNOSIS — I10 Essential (primary) hypertension: Secondary | ICD-10-CM | POA: Diagnosis not present

## 2016-06-27 DIAGNOSIS — H2512 Age-related nuclear cataract, left eye: Secondary | ICD-10-CM | POA: Insufficient documentation

## 2016-06-27 DIAGNOSIS — H269 Unspecified cataract: Secondary | ICD-10-CM | POA: Diagnosis not present

## 2016-06-27 DIAGNOSIS — F419 Anxiety disorder, unspecified: Secondary | ICD-10-CM | POA: Diagnosis not present

## 2016-06-27 DIAGNOSIS — Z87891 Personal history of nicotine dependence: Secondary | ICD-10-CM | POA: Diagnosis not present

## 2016-06-27 DIAGNOSIS — Z79899 Other long term (current) drug therapy: Secondary | ICD-10-CM | POA: Diagnosis not present

## 2016-06-27 HISTORY — PX: CATARACT EXTRACTION W/PHACO: SHX586

## 2016-06-27 SURGERY — PHACOEMULSIFICATION, CATARACT, WITH IOL INSERTION
Anesthesia: Monitor Anesthesia Care | Site: Eye | Laterality: Left

## 2016-06-27 MED ORDER — BSS IO SOLN
INTRAOCULAR | Status: DC | PRN
  Administered 2016-06-27: 500 mL

## 2016-06-27 MED ORDER — FENTANYL CITRATE (PF) 100 MCG/2ML IJ SOLN
25.0000 ug | Freq: Once | INTRAMUSCULAR | Status: AC
  Administered 2016-06-27: 25 ug via INTRAVENOUS

## 2016-06-27 MED ORDER — PHENYLEPHRINE HCL 2.5 % OP SOLN
1.0000 [drp] | OPHTHALMIC | Status: AC
  Administered 2016-06-27 (×3): 1 [drp] via OPHTHALMIC

## 2016-06-27 MED ORDER — PROVISC 10 MG/ML IO SOLN
INTRAOCULAR | Status: DC | PRN
  Administered 2016-06-27: 0.85 mL via INTRAOCULAR

## 2016-06-27 MED ORDER — BSS IO SOLN
INTRAOCULAR | Status: DC | PRN
  Administered 2016-06-27: 15 mL

## 2016-06-27 MED ORDER — TETRACAINE HCL 0.5 % OP SOLN
1.0000 [drp] | OPHTHALMIC | Status: AC
  Administered 2016-06-27 (×3): 1 [drp] via OPHTHALMIC

## 2016-06-27 MED ORDER — FENTANYL CITRATE (PF) 100 MCG/2ML IJ SOLN
INTRAMUSCULAR | Status: AC
Start: 2016-06-27 — End: ?
  Filled 2016-06-27: qty 2

## 2016-06-27 MED ORDER — POVIDONE-IODINE 5 % OP SOLN
OPHTHALMIC | Status: DC | PRN
  Administered 2016-06-27: 1 via OPHTHALMIC

## 2016-06-27 MED ORDER — LIDOCAINE HCL 3.5 % OP GEL
1.0000 "application " | Freq: Once | OPHTHALMIC | Status: AC
  Administered 2016-06-27: 1 via OPHTHALMIC

## 2016-06-27 MED ORDER — MIDAZOLAM HCL 2 MG/2ML IJ SOLN
INTRAMUSCULAR | Status: AC
  Filled 2016-06-27: qty 2

## 2016-06-27 MED ORDER — LACTATED RINGERS IV SOLN
INTRAVENOUS | Status: DC
  Administered 2016-06-27: 09:00:00 via INTRAVENOUS

## 2016-06-27 MED ORDER — NEOMYCIN-POLYMYXIN-DEXAMETH 3.5-10000-0.1 OP SUSP
OPHTHALMIC | Status: DC | PRN
  Administered 2016-06-27: 2 [drp] via OPHTHALMIC

## 2016-06-27 MED ORDER — MIDAZOLAM HCL 2 MG/2ML IJ SOLN
1.0000 mg | INTRAMUSCULAR | Status: AC
  Administered 2016-06-27: 2 mg via INTRAVENOUS

## 2016-06-27 MED ORDER — CYCLOPENTOLATE-PHENYLEPHRINE 0.2-1 % OP SOLN
1.0000 [drp] | OPHTHALMIC | Status: AC
  Administered 2016-06-27 (×3): 1 [drp] via OPHTHALMIC

## 2016-06-27 MED ORDER — LIDOCAINE HCL (PF) 1 % IJ SOLN
INTRAMUSCULAR | Status: DC | PRN
  Administered 2016-06-27: .6 mL

## 2016-06-27 SURGICAL SUPPLY — 11 items

## 2016-06-27 NOTE — Transfer of Care (Signed)
Immediate Anesthesia Transfer of Care Note  Patient: Linda Barr  Procedure(s) Performed: Procedure(s) with comments: CATARACT EXTRACTION PHACO AND INTRAOCULAR LENS PLACEMENT (IOC) (Left) - CDE: 7.94  Patient Location: Short Stay  Anesthesia Type:MAC  Level of Consciousness: awake, alert , oriented and patient cooperative  Airway & Oxygen Therapy: Patient Spontanous Breathing  Post-op Assessment: Report given to RN and Post -op Vital signs reviewed and stable  Post vital signs: Reviewed and stable  Last Vitals:  Vitals:   06/27/16 0920 06/27/16 0925  BP: (!) 142/81   Resp: 18 20  Temp:      Last Pain:  Vitals:   06/27/16 0911  TempSrc: Oral      Patients Stated Pain Goal: 8 (40/81/44 8185)  Complications: No apparent anesthesia complications

## 2016-06-27 NOTE — Anesthesia Preprocedure Evaluation (Signed)
Anesthesia Evaluation  Patient identified by MRN, date of birth, ID band Patient awake    Reviewed: Allergy & Precautions, NPO status , Patient's Chart, lab work & pertinent test results  Airway Mallampati: II  TM Distance: >3 FB     Dental  (+) Edentulous Upper, Edentulous Lower   Pulmonary former smoker,    breath sounds clear to auscultation       Cardiovascular hypertension, Pt. on medications + dysrhythmias  Rhythm:Regular Rate:Normal     Neuro/Psych PSYCHIATRIC DISORDERS Anxiety    GI/Hepatic Liver transplant    Endo/Other    Renal/GU      Musculoskeletal   Abdominal   Peds  Hematology   Anesthesia Other Findings   Reproductive/Obstetrics                             Anesthesia Physical Anesthesia Plan  ASA: III  Anesthesia Plan: MAC   Post-op Pain Management:    Induction: Intravenous  Airway Management Planned: Nasal Cannula  Additional Equipment:   Intra-op Plan:   Post-operative Plan:   Informed Consent: I have reviewed the patients History and Physical, chart, labs and discussed the procedure including the risks, benefits and alternatives for the proposed anesthesia with the patient or authorized representative who has indicated his/her understanding and acceptance.     Plan Discussed with:   Anesthesia Plan Comments:         Anesthesia Quick Evaluation

## 2016-06-27 NOTE — H&P (Signed)
I have reviewed the H&P, the patient was re-examined, and I have identified no interval changes in medical condition and plan of care since the history and physical of record  

## 2016-06-27 NOTE — Op Note (Signed)
Date of Admission: 06/27/2016  Date of Surgery: 06/27/2016  Pre-Op Dx: Cataract Left  Eye  Post-Op Dx: Senile Nuclear Cataract  Left  Eye,  Dx Code H25.12  Surgeon: Tonny Branch, M.D.  Assistants: None  Anesthesia: Topical with MAC  Indications: Painless, progressive loss of vision with compromise of daily activities.  Surgery: Cataract Extraction with Intraocular lens Implant Left Eye  Discription: The patient had dilating drops and viscous lidocaine placed into the Left eye in the pre-op holding area. After transfer to the operating room, a time out was performed. The patient was then prepped and draped. Beginning with a 17 degree blade a paracentesis port was made at the surgeon's 2 o'clock position. The anterior chamber was then filled with 1% non-preserved lidocaine. This was followed by filling the anterior chamber with Provisc.  A 2.74m keratome blade was used to make a clear corneal incision at the temporal limbus.  A bent cystatome needle was used to create a continuous tear capsulotomy. Hydrodissection was performed with balanced salt solution on a Fine canula. The lens nucleus was then removed using the phacoemulsification handpiece. Residual cortex was removed with the I&A handpiece. The anterior chamber and capsular bag were refilled with Provisc. A posterior chamber intraocular lens was placed into the capsular bag with it's injector. The implant was positioned with the Kuglan hook. The Provisc was then removed from the anterior chamber and capsular bag with the I&A handpiece. Stromal hydration of the main incision and paracentesis port was performed with BSS on a Fine canula. The wounds were tested for leak which was negative. The patient tolerated the procedure well. There were no operative complications. The patient was then transferred to the recovery room in stable condition.  Complications: None  Specimen: None  EBL: None  Prosthetic device: Abbott Technis, PCB00, power  24.0, SN [id number]

## 2016-06-27 NOTE — Anesthesia Procedure Notes (Signed)
Procedure Name: MAC Date/Time: 06/27/2016 9:25 AM Performed by: Andree Elk, AMY A Pre-anesthesia Checklist: Patient identified, Timeout performed, Emergency Drugs available, Suction available and Patient being monitored Oxygen Delivery Method: Nasal cannula

## 2016-06-27 NOTE — Discharge Instructions (Signed)

## 2016-06-27 NOTE — Anesthesia Postprocedure Evaluation (Signed)
Anesthesia Post Note  Patient: Linda Barr  Procedure(s) Performed: Procedure(s) (LRB): CATARACT EXTRACTION PHACO AND INTRAOCULAR LENS PLACEMENT (IOC) (Left)  Patient location during evaluation: PACU Anesthesia Type: MAC Level of consciousness: awake and alert and oriented Pain management: pain level controlled Vital Signs Assessment: post-procedure vital signs reviewed and stable Respiratory status: spontaneous breathing Cardiovascular status: stable Postop Assessment: no signs of nausea or vomiting Anesthetic complications: no     Last Vitals:  Vitals:   06/27/16 0920 06/27/16 0925  BP: (!) 142/81   Resp: 18 20  Temp:      Last Pain:  Vitals:   06/27/16 0911  TempSrc: Oral                 ADAMS, AMY A

## 2016-06-29 ENCOUNTER — Encounter (HOSPITAL_COMMUNITY): Payer: Self-pay | Admitting: Ophthalmology

## 2016-07-08 DIAGNOSIS — Z6837 Body mass index (BMI) 37.0-37.9, adult: Secondary | ICD-10-CM | POA: Diagnosis not present

## 2016-07-08 DIAGNOSIS — I10 Essential (primary) hypertension: Secondary | ICD-10-CM | POA: Diagnosis not present

## 2016-07-08 DIAGNOSIS — G8929 Other chronic pain: Secondary | ICD-10-CM | POA: Diagnosis not present

## 2016-07-08 DIAGNOSIS — R7301 Impaired fasting glucose: Secondary | ICD-10-CM | POA: Diagnosis not present

## 2016-07-13 DIAGNOSIS — H2511 Age-related nuclear cataract, right eye: Secondary | ICD-10-CM | POA: Diagnosis not present

## 2016-07-14 DIAGNOSIS — Z79899 Other long term (current) drug therapy: Secondary | ICD-10-CM | POA: Diagnosis not present

## 2016-07-14 DIAGNOSIS — K769 Liver disease, unspecified: Secondary | ICD-10-CM | POA: Diagnosis not present

## 2016-07-14 DIAGNOSIS — Z944 Liver transplant status: Secondary | ICD-10-CM | POA: Diagnosis not present

## 2016-07-20 DIAGNOSIS — R7301 Impaired fasting glucose: Secondary | ICD-10-CM | POA: Diagnosis not present

## 2016-07-20 DIAGNOSIS — I10 Essential (primary) hypertension: Secondary | ICD-10-CM | POA: Diagnosis not present

## 2016-07-22 ENCOUNTER — Encounter (HOSPITAL_COMMUNITY)
Admission: RE | Admit: 2016-07-22 | Discharge: 2016-07-22 | Disposition: A | Discharge status: Home / Self Care | Payer: Medicare Other | Source: Ambulatory Visit | Attending: Ophthalmology | Admitting: Ophthalmology

## 2016-07-22 DIAGNOSIS — Z0000 Encounter for general adult medical examination without abnormal findings: Secondary | ICD-10-CM | POA: Diagnosis not present

## 2016-07-22 DIAGNOSIS — E782 Mixed hyperlipidemia: Secondary | ICD-10-CM | POA: Diagnosis not present

## 2016-07-22 DIAGNOSIS — G8929 Other chronic pain: Secondary | ICD-10-CM | POA: Diagnosis not present

## 2016-07-22 DIAGNOSIS — K580 Irritable bowel syndrome with diarrhea: Secondary | ICD-10-CM | POA: Diagnosis not present

## 2016-07-22 DIAGNOSIS — I10 Essential (primary) hypertension: Secondary | ICD-10-CM | POA: Diagnosis not present

## 2016-07-22 DIAGNOSIS — R7301 Impaired fasting glucose: Secondary | ICD-10-CM | POA: Diagnosis not present

## 2016-07-22 DIAGNOSIS — Z6837 Body mass index (BMI) 37.0-37.9, adult: Secondary | ICD-10-CM | POA: Diagnosis not present

## 2016-07-25 ENCOUNTER — Other Ambulatory Visit (HOSPITAL_COMMUNITY): Payer: Self-pay | Admitting: Internal Medicine

## 2016-07-25 DIAGNOSIS — Z780 Asymptomatic menopausal state: Secondary | ICD-10-CM

## 2016-07-27 ENCOUNTER — Encounter (HOSPITAL_COMMUNITY): Payer: Self-pay

## 2016-07-28 ENCOUNTER — Encounter (HOSPITAL_COMMUNITY): Payer: Self-pay | Admitting: Ophthalmology

## 2016-07-28 ENCOUNTER — Ambulatory Visit (HOSPITAL_COMMUNITY)
Admission: RE | Admit: 2016-07-28 | Discharge: 2016-07-28 | Disposition: A | Discharge status: Home / Self Care | Payer: Medicare Other | Source: Ambulatory Visit | Attending: Ophthalmology | Admitting: Ophthalmology

## 2016-07-28 ENCOUNTER — Encounter (HOSPITAL_COMMUNITY)
Admission: RE | Disposition: A | Discharge status: Home / Self Care | Payer: Self-pay | Source: Ambulatory Visit | Attending: Ophthalmology

## 2016-07-28 ENCOUNTER — Ambulatory Visit (HOSPITAL_COMMUNITY): Payer: Medicare Other | Admitting: Anesthesiology

## 2016-07-28 DIAGNOSIS — F419 Anxiety disorder, unspecified: Secondary | ICD-10-CM | POA: Diagnosis not present

## 2016-07-28 DIAGNOSIS — I10 Essential (primary) hypertension: Secondary | ICD-10-CM | POA: Insufficient documentation

## 2016-07-28 DIAGNOSIS — H2511 Age-related nuclear cataract, right eye: Secondary | ICD-10-CM | POA: Insufficient documentation

## 2016-07-28 DIAGNOSIS — Z79899 Other long term (current) drug therapy: Secondary | ICD-10-CM | POA: Insufficient documentation

## 2016-07-28 DIAGNOSIS — Z87891 Personal history of nicotine dependence: Secondary | ICD-10-CM | POA: Insufficient documentation

## 2016-07-28 HISTORY — PX: CATARACT EXTRACTION W/PHACO: SHX586

## 2016-07-28 SURGERY — PHACOEMULSIFICATION, CATARACT, WITH IOL INSERTION
Anesthesia: Monitor Anesthesia Care | Site: Eye | Laterality: Right

## 2016-07-28 MED ORDER — BSS IO SOLN
INTRAOCULAR | Status: DC | PRN
  Administered 2016-07-28: 15 mL

## 2016-07-28 MED ORDER — EPINEPHRINE PF 1 MG/ML IJ SOLN
INTRAOCULAR | Status: DC | PRN
  Administered 2016-07-28: 500 mL

## 2016-07-28 MED ORDER — EPINEPHRINE PF 1 MG/ML IJ SOLN
INTRAMUSCULAR | Status: AC
  Filled 2016-07-28: qty 1

## 2016-07-28 MED ORDER — MIDAZOLAM HCL 2 MG/2ML IJ SOLN
INTRAMUSCULAR | Status: AC
  Filled 2016-07-28: qty 2

## 2016-07-28 MED ORDER — LIDOCAINE HCL 3.5 % OP GEL
1.0000 "application " | Freq: Once | OPHTHALMIC | Status: AC
  Administered 2016-07-28: 1 via OPHTHALMIC

## 2016-07-28 MED ORDER — CYCLOPENTOLATE-PHENYLEPHRINE 0.2-1 % OP SOLN
1.0000 [drp] | OPHTHALMIC | Status: AC
  Administered 2016-07-28 (×3): 1 [drp] via OPHTHALMIC

## 2016-07-28 MED ORDER — MIDAZOLAM HCL 2 MG/2ML IJ SOLN
INTRAMUSCULAR | Status: DC | PRN
  Administered 2016-07-28 (×2): 1 mg via INTRAVENOUS

## 2016-07-28 MED ORDER — MIDAZOLAM HCL 2 MG/2ML IJ SOLN
1.0000 mg | INTRAMUSCULAR | Status: AC
  Administered 2016-07-28: 2 mg via INTRAVENOUS

## 2016-07-28 MED ORDER — PROVISC 10 MG/ML IO SOLN
INTRAOCULAR | Status: DC | PRN
  Administered 2016-07-28: 0.85 mL via INTRAOCULAR

## 2016-07-28 MED ORDER — LACTATED RINGERS IV SOLN
INTRAVENOUS | Status: DC
  Administered 2016-07-28: 10:00:00 via INTRAVENOUS

## 2016-07-28 MED ORDER — NEOMYCIN-POLYMYXIN-DEXAMETH 3.5-10000-0.1 OP SUSP
OPHTHALMIC | Status: DC | PRN
  Administered 2016-07-28: 2 [drp] via OPHTHALMIC

## 2016-07-28 MED ORDER — TETRACAINE HCL 0.5 % OP SOLN
1.0000 [drp] | OPHTHALMIC | Status: AC
  Administered 2016-07-28 (×3): 1 [drp] via OPHTHALMIC

## 2016-07-28 MED ORDER — LIDOCAINE HCL (PF) 1 % IJ SOLN
INTRAMUSCULAR | Status: DC | PRN
  Administered 2016-07-28: .5 mL

## 2016-07-28 MED ORDER — PHENYLEPHRINE HCL 2.5 % OP SOLN
1.0000 [drp] | OPHTHALMIC | Status: AC
  Administered 2016-07-28 (×3): 1 [drp] via OPHTHALMIC

## 2016-07-28 MED ORDER — POVIDONE-IODINE 5 % OP SOLN
OPHTHALMIC | Status: DC | PRN
Start: 2016-07-28 — End: 2016-07-28
  Administered 2016-07-28: 1 via OPHTHALMIC

## 2016-07-28 SURGICAL SUPPLY — 12 items

## 2016-07-28 NOTE — Discharge Instructions (Signed)

## 2016-07-28 NOTE — Anesthesia Preprocedure Evaluation (Signed)
Anesthesia Evaluation  Patient identified by MRN, date of birth, ID band Patient awake    Reviewed: Allergy & Precautions, NPO status , Patient's Chart, lab work & pertinent test results  Airway Mallampati: II  TM Distance: >3 FB     Dental  (+) Edentulous Upper, Edentulous Lower   Pulmonary former smoker,    breath sounds clear to auscultation       Cardiovascular hypertension, Pt. on medications + dysrhythmias  Rhythm:Regular Rate:Normal     Neuro/Psych PSYCHIATRIC DISORDERS Anxiety    GI/Hepatic Liver transplant    Endo/Other    Renal/GU      Musculoskeletal   Abdominal   Peds  Hematology   Anesthesia Other Findings   Reproductive/Obstetrics                             Anesthesia Physical Anesthesia Plan  ASA: III  Anesthesia Plan: MAC   Post-op Pain Management:    Induction: Intravenous  Airway Management Planned: Nasal Cannula  Additional Equipment:   Intra-op Plan:   Post-operative Plan:   Informed Consent: I have reviewed the patients History and Physical, chart, labs and discussed the procedure including the risks, benefits and alternatives for the proposed anesthesia with the patient or authorized representative who has indicated his/her understanding and acceptance.     Plan Discussed with:   Anesthesia Plan Comments:         Anesthesia Quick Evaluation

## 2016-07-28 NOTE — H&P (Signed)
I have reviewed the H&P, the patient was re-examined, and I have identified no interval changes in medical condition and plan of care since the history and physical of record  

## 2016-07-28 NOTE — Transfer of Care (Signed)
Immediate Anesthesia Transfer of Care Note  Patient: Linda Barr  Procedure(s) Performed: Procedure(s) (LRB): CATARACT EXTRACTION PHACO AND INTRAOCULAR LENS PLACEMENT RIGHT EYE CDE=6.33 (Right)  Patient Location: Shortstay  Anesthesia Type: MAC  Level of Consciousness: awake  Airway & Oxygen Therapy: Patient Spontanous Breathing   Post-op Assessment: Report given to PACU RN, Post -op Vital signs reviewed and stable and Patient moving all extremities  Post vital signs: Reviewed and stable  Complications: No apparent anesthesia complications

## 2016-07-28 NOTE — Anesthesia Procedure Notes (Signed)
Procedure Name: MAC Date/Time: 07/28/2016 9:54 AM Performed by: Vista Deck Pre-anesthesia Checklist: Patient identified, Emergency Drugs available, Suction available, Timeout performed and Patient being monitored Patient Re-evaluated:Patient Re-evaluated prior to inductionOxygen Delivery Method: Nasal Cannula

## 2016-07-28 NOTE — Anesthesia Postprocedure Evaluation (Signed)
Anesthesia Post Note  Patient: Linda Barr  Procedure(s) Performed: Procedure(s) (LRB): CATARACT EXTRACTION PHACO AND INTRAOCULAR LENS PLACEMENT RIGHT EYE CDE=6.33 (Right)  Patient location during evaluation: Short Stay Anesthesia Type: MAC Level of consciousness: awake and alert Pain management: pain level controlled Vital Signs Assessment: post-procedure vital signs reviewed and stable Respiratory status: spontaneous breathing Cardiovascular status: stable Anesthetic complications: no     Last Vitals: There were no vitals filed for this visit.  Last Pain: There were no vitals filed for this visit.               Drucie Opitz

## 2016-07-28 NOTE — Op Note (Signed)
Date of Admission: 07/28/2016  Date of Surgery: 07/28/2016  Pre-Op Dx: Cataract Right  Eye  Post-Op Dx: Senile Nuclear Cataract  Right  Eye,  Dx Code H25.11  Surgeon: Tonny Branch, M.D.  Assistants: None  Anesthesia: Topical with MAC  Indications: Painless, progressive loss of vision with compromise of daily activities.  Surgery: Cataract Extraction with Intraocular lens Implant Right Eye  Discription: The patient had dilating drops and viscous lidocaine placed into the Right eye in the pre-op holding area. After transfer to the operating room, a time out was performed. The patient was then prepped and draped. Beginning with a 68 degree blade a paracentesis port was made at the surgeon's 2 o'clock position. The anterior chamber was then filled with 1% non-preserved lidocaine. This was followed by filling the anterior chamber with Provisc.  A 2.77m keratome blade was used to make a clear corneal incision at the temporal limbus.  A bent cystatome needle was used to create a continuous tear capsulotomy. Hydrodissection was performed with balanced salt solution on a Fine canula. The lens nucleus was then removed using the phacoemulsification handpiece. Residual cortex was removed with the I&A handpiece. The anterior chamber and capsular bag were refilled with Provisc. A posterior chamber intraocular lens was placed into the capsular bag with it's injector. The implant was positioned with the Kuglan hook. The Provisc was then removed from the anterior chamber and capsular bag with the I&A handpiece. Stromal hydration of the main incision and paracentesis port was performed with BSS on a Fine canula. The wounds were tested for leak which was negative. The patient tolerated the procedure well. There were no operative complications. The patient was then transferred to the recovery room in stable condition.  Complications: None  Specimen: None  EBL: None  Prosthetic device: Abbott Technis, PCB00, power  24.5, SN [id number]

## 2016-07-29 ENCOUNTER — Encounter (HOSPITAL_COMMUNITY): Payer: Self-pay | Admitting: Ophthalmology

## 2016-08-03 ENCOUNTER — Other Ambulatory Visit (HOSPITAL_COMMUNITY): Payer: Medicare Other

## 2016-08-08 ENCOUNTER — Other Ambulatory Visit (HOSPITAL_COMMUNITY): Payer: Medicare Other

## 2016-08-12 DIAGNOSIS — Z79899 Other long term (current) drug therapy: Secondary | ICD-10-CM | POA: Diagnosis not present

## 2016-08-12 DIAGNOSIS — Z944 Liver transplant status: Secondary | ICD-10-CM | POA: Diagnosis not present

## 2016-08-12 DIAGNOSIS — K769 Liver disease, unspecified: Secondary | ICD-10-CM | POA: Diagnosis not present

## 2016-08-26 ENCOUNTER — Ambulatory Visit (HOSPITAL_COMMUNITY)
Admission: RE | Admit: 2016-08-26 | Discharge: 2016-08-26 | Disposition: A | Discharge status: Home / Self Care | Payer: Medicare Other | Source: Ambulatory Visit | Attending: Internal Medicine | Admitting: Internal Medicine

## 2016-08-26 DIAGNOSIS — M85852 Other specified disorders of bone density and structure, left thigh: Secondary | ICD-10-CM | POA: Insufficient documentation

## 2016-08-26 DIAGNOSIS — Z780 Asymptomatic menopausal state: Secondary | ICD-10-CM | POA: Insufficient documentation

## 2016-09-30 DIAGNOSIS — K769 Liver disease, unspecified: Secondary | ICD-10-CM | POA: Diagnosis not present

## 2016-09-30 DIAGNOSIS — Z944 Liver transplant status: Secondary | ICD-10-CM | POA: Diagnosis not present

## 2016-09-30 DIAGNOSIS — Z79899 Other long term (current) drug therapy: Secondary | ICD-10-CM | POA: Diagnosis not present

## 2016-10-24 NOTE — Unmapped (Signed)
Specialty Pharmacy Refill Coordination Note     Tamara Morris is a 76 y.o. female contacted today regarding refills of her specialty medication(s).    Reviewed and verified with patient:      Specialty medication(s) and dose(s) confirmed: yes  Changes to medications: no  Changes to insurance: no    Medication Adherence    Patient reported X missed doses in the last month:  0  Specialty Medication:  Prograf 1mg   Informant:  patient  Reliability of informant:  reliable  Provider-estimated medication adherence level:  90-100%  Patient is at risk for Non-Adherence:  No  Confirmed plan for next specialty medication refill:  delivery by pharmacy  Medication Assistance Program  Refill Coordination  Has the Patient's Contact Information Changed:  No  Is the Shipping Address Different:  No  Shipping Information  Delivery Scheduled:  Yes  Delivery Date:  10/28/16  Medications to be Shipped:  Prograf 1mg           Follow-up: 3 week(s)     [employer]  Specialty Pharmacy Technician

## 2016-10-26 NOTE — Unmapped (Deleted)
FOLLOW UP ANNUAL LIVER CLINIC NOTE     Patient Name: Tamara Morris  Medical Record Number: [id number]  Date of Service: 10/26/2016    Referring Physician: Alice Reichert   Current complaint: Follow up Annual Liver    Assessment/Plan:     Tamara Morris is a 76 y.o. 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  Routine studies today are ***.      Continue tacrolimus 1mg  BID  Stop naproxen, continue to follow with pain clinic    HEALTH MAINTENANCE:   - Dermatology: last***due***  - Dental: last***due***  - Eye exam: last***due***  - Bone density: 08/26/2016  - Mental health: ***    There is no immunization history on file for this patient.    Pneumonia Vaccine  Shingrix     Return to clinic: ***  Labs: ***    I spent a total of *** minutes of which 50% was spent in counseling and coordination of care.    Subjective:     HPI: Tamara Morris is a 76 y.o. female who underwent OLT on 03/06/1993 for cryptogenic cirrhosis c/b graft non-function retransplanted 03/08/1993 who presents for annual evaluation. She has had *** changes to health since previous visit. Recent testing/images *** immunosuppressive levels were *** within goal range (~3).  She had bilateral cataract extractions earlier this year.     Denies fever, chills, arthralgias, weight loss/gain, and fatigue. Denies chest pain, SOB, N/V/D, constipation or abdominal pain. He denies acute complaint today.Marland Kitchen    No past medical history on file.    No past surgical history on file.    No family history on file.    Social History     Social History   ??? Marital status: Divorced     Spouse name: N/A   ??? Number of children: N/A   ??? Years of education: N/A     Occupational History   ??? Not on file.     Social History Main Topics   ??? Smoking status: Former Smoker     Types: Cigarettes     Quit date: 11/02/1988   ??? Smokeless tobacco: Never Used ??? Alcohol use Not on file   ??? Drug use: Unknown   ??? Sexual activity: Not on file     Other Topics Concern   ??? Not on file     Social History Narrative   ??? No narrative on file       REVIEW OF SYSTEMS:   The balance of 10/12 systems is negative with the exception of HPI.    Objective:     MEDICATIONS:  Allergies   Allergen Reactions   ??? Codeine Other (See Comments)   ??? Hydromorphone Other (See Comments)   ??? Meperidine Other (See Comments)       Current Outpatient Prescriptions   Medication Sig Dispense Refill   ??? acetaminophen (TYLENOL) 325 MG tablet Take 500 mg by mouth every six (6) hours as needed for pain.      ??? diclofenac sodium (VOLTAREN) 1 % gel Apply 2 g topically Four (4) times a day. 200 g 5   ??? ezetimibe (ZETIA) 10 mg tablet Take 10 mg by mouth. Frequency:QD   Dosage:10   MG  Instructions:  Note:Dose: 10MG      ??? lisinopril (PRINIVIL,ZESTRIL) 10 MG tablet Take 15 mg by mouth daily.     ??? loperamide (IMODIUM A-D) 2  mg tablet Take 2 mg by mouth. Frequency:PRN   Dosage:2   MG  Instructions:  Note:Dose: 2MG      ??? LORazepam (ATIVAN) 0.5 MG tablet Take 0.5 mg by mouth. Frequency:PRN   Dosage:0.5   MG  Instructions:  Note:Dose: 0.5MG      ??? multivitamin (MULTIPLE VITAMINS) per tablet Take by mouth. Frequency:UNKNOWN   Dosage:0.0     Instructions:  Note:Dose: UNKNOWN     ??? naproxen sodium (ALEVE) 220 MG tablet Take 220 mg by mouth daily as needed for pain.     ??? NON FORMULARY Take 240 mg by mouth. Frequency:QHS   Dosage:240   MG  Instructions:  Note:Dose: 240MG      ??? NON FORMULARY Take 25 mg by mouth. Frequency:PRN   Dosage:25   MG  Instructions:  Note:Dose: 25MG      ??? pravastatin (PRAVACHOL) 40 MG tablet Take 40 mg by mouth. Frequency:QHS   Dosage:40   MG  Instructions:  Note:Dose: 40 MG     ??? pregabalin (LYRICA) 25 MG capsule Take 1 capsule (25 mg total) by mouth Two (2) times a day. 60 capsule 5   ??? tacrolimus (PROGRAF) 1 MG capsule Take 1 capsule (1 mg total) by mouth Two (2) times a day. 60 capsule 11   ??? verapamil (CALAN-SR) 240 MG CR tablet      ??? ZANAFLEX 4 mg tablet Take 1/2 to 1 tablet every 8 hours PRN for spasms. 90 tablet 5     No current facility-administered medications for this visit.          PHYSICAL EXAM:  There were no vitals taken for this visit.    General Appearance:  NAD, well appearing and well nourished.   HEENT:  [place]/AT. Well hydrated moist mucous membranes of the oral cavity. No scleral icterus. Neck supple; no JVD. No cervical lymphadenopathy.   Pulmonary:    Normal respiratory effort. CTAB, without wheezes/crackles/rhonchi. Good air movement.    Cardiovascular:  Regular rate and rhythm, no murmur noted.   Extremities No edema. No rash, lesions or petiche.   Abdomen:   Normoactive bowel sounds, abdomen soft, non-tender and not distended, no Hepatosplenomegaly or masses. Abdominal scar well healed {with or without:28014} hernia.    Musculoskeletal: No joint tenderness, full ROM. Normal gait.    Skin: Skin color, texture, turgor normal, no rashes or lesions.   Neurologic: Alert and oriented to person, place, and time. No motor abnormalities noted.  Sensation grossly intact.   Psychiatric: Judgement and insight appropriate.        LAB RESULTS:  All lab results last 24 hours:  No results found for this or any previous visit (from the past 48 hour(s)).      IMAGING:  Fl Ic Guidance Needle Placement    Result Date: 01/12/2016  This order was placed for internal RIS purposes.  This order does not require a diagnostic report. bg      {[facility] Imaging findings:[id number]}  No images are attached to the encounter.    _______________________________________________        Ermalinda Barrios, DNP, APRN, FNP-C  [facility]  [address]  [place], [place]  [address]

## 2016-10-27 MED FILL — PROGRAF/1MG/CAP: PROGRAF/1MG/CAP | 30 days supply | Qty: 60 | Fill #3

## 2016-11-08 NOTE — Unmapped (Signed)
Yesterday discovered pt had been rescheduled by front desk staff for only the labs and clinic portions of her annual appt scheduled for today after cancelling appt a few weeks ago - no radiology appts scheduled.    Called pt to discuss. After several conversations, pt agreed to r/s to Friday 8/24. Reminded her of appt letter, and she verbalized understanding of all discussed.

## 2016-11-18 NOTE — Unmapped (Signed)
[facility] Refill and Clinical Coordination Note  Medication(s): PROGRAF 1    Tamara Morris, DOB: 1940/05/11  Phone: [phone] (home) , Alternate phone contact: N/A  Shipping address: [address]  [place] [address]  Phone or address changes today?: No  All above HIPAA information verified.  Insurance changes? No    Completed refill and clinical call assessment today to schedule patient's medication shipment from the [facility] [phone]).      MEDICATION RECONCILIATION    Confirmed the medication and dosage are correct and have not changed: Yes, regimen is correct and unchanged.    Were there any changes to your medication(s) in the past month:  No, there are no changes reported at this time.    ADHERENCE    Is this medicine transplant or covered by Medicare Part B? Yes.    Prograf 1 mg   Quantity filled last month: 60   # of tablets left on hand: 20        Did you miss any doses in the past 4 weeks? No missed doses reported.  Adherence counseling provided? Not needed     SIDE EFFECT MANAGEMENT    Are you tolerating your medication?:  Tamara Morris reports tolerating the medication.  Side effect management discussed: None      Therapy is appropriate and should be continued.    Evidence of clinical benefit: See Epic note from 08/12/16      FINANCIAL/SHIPPING    Delivery Scheduled: Yes, Expected medication delivery date: 11/24/16   Additional medications refilled: No additional medications/refills needed at this time.    Tamara Morris did not have any additional questions at this time.    Delivery address validated in [facility] scheduling system: Yes, address listed above is correct.      We will follow up with patient monthly for standard refill processing and delivery.      Thank you,  Mickle Mallory   [facility] Specialty Pharmacist

## 2016-11-23 MED FILL — PROGRAF/1MG/CAP: PROGRAF/1MG/CAP | 30 days supply | Qty: 60 | Fill #4

## 2016-11-23 NOTE — Unmapped (Signed)
FOLLOW UP ANNUAL LIVER CLINIC NOTE     Patient Name: Tamara Morris  Medical Record Number: [id number]  Date of Service: 11/23/2016    Referring Physician: Per Patient Pcp Unknown   Current complaint: Follow up Annual Liver    Assessment/Plan:     Tamara Morris is a 76 y.o. 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  Recent labs WNL, images and immunosuppressive levels were pending, goal range (~3). Plan to continueTacrolimus 1mg  BID for now.    NSAID use, very sparingly - a few times a month at most for uncontrollable back pain. Encouraged her to continue close follow up with local pain clinic    She has, what she attributes to tacrolimus use, a growth on her r clavicle at midline. She states it is getting bigger, but has never had it investigated. It feels hard to touch and bone-like. No osseous finding to chest xray today, can consider additional imaging of neck and/or soft tissue if patient would like.     HEALTH MAINTENANCE:   - Dermatology: follows with dermatology, has a skin tag on R upper chest. Encouraged her to get it removed if cosmetically bothersome to her, her PCP can do this  - Dental: regular cleanings  - Eye exam: no concern, bilateral cataract extractions earlier this year.   - Mental health: no concerns, would like to reach out to her donor family. Will try to facilitate this.     There is no immunization history on file for this patient.    Singrix #1 today, will receive #2 with PCP 01/25/2017-05/28/2016.   Will receive flu shot this fall, and will send [facility] records of pneumonia.    Return to clinic: 1 year  Labs: monthly    I spent a total of 25 minutes of which 50% was spent in counseling and coordination of care.    Subjective:     HPI: Tamara Morris is a 76 y.o. 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 She has had minimal changes to health since previous visit. She had bilateral cataract extractions earlier this year.       Denies fever, chills, arthralgias, and fatigue.reports some weight loss, but she typically does fluctuate ~5-10lbs. Denies chest pain, SOB, N/V/D, constipation or abdominal pain. She denies acute complaint today, but does report persistent chronic back pain for which she follows in the pain clinic and uses a rollator walker.    No past medical history on file.    No past surgical history on file.    No family history on file.    Social History     Social History   ??? Marital status: Divorced     Spouse name: N/A   ??? Number of children: N/A   ??? Years of education: N/A     Occupational History   ??? Not on file.     Social History Main Topics   ??? Smoking status: Former Smoker     Types: Cigarettes     Quit date: 11/02/1988   ??? Smokeless tobacco: Never Used   ??? Alcohol use Not on file   ??? Drug use: Unknown   ??? Sexual activity: Not on file     Other Topics Concern   ??? Not on file     Social History Narrative   ???  No narrative on file       REVIEW OF SYSTEMS:   The balance of 10/12 systems is negative with the exception of HPI.    Objective:     MEDICATIONS:  Allergies   Allergen Reactions   ??? Codeine Other (See Comments)   ??? Hydromorphone Other (See Comments)   ??? Meperidine Other (See Comments)       Current Outpatient Prescriptions   Medication Sig Dispense Refill   ??? acetaminophen (TYLENOL) 325 MG tablet Take 500 mg by mouth every six (6) hours as needed for pain.      ??? diclofenac sodium (VOLTAREN) 1 % gel Apply 2 g topically Four (4) times a day. 200 g 5   ??? ezetimibe (ZETIA) 10 mg tablet Take 10 mg by mouth. Frequency:QD   Dosage:10   MG  Instructions:  Note:Dose: 10MG      ??? lisinopril (PRINIVIL,ZESTRIL) 10 MG tablet Take 15 mg by mouth daily.     ??? loperamide (IMODIUM A-D) 2 mg tablet Take 2 mg by mouth. Frequency:PRN Dosage:2   MG  Instructions:  Note:Dose: 2MG      ??? LORazepam (ATIVAN) 0.5 MG tablet Take 0.5 mg by mouth. Frequency:PRN   Dosage:0.5   MG  Instructions:  Note:Dose: 0.5MG      ??? multivitamin (MULTIPLE VITAMINS) per tablet Take by mouth. Frequency:UNKNOWN   Dosage:0.0     Instructions:  Note:Dose: UNKNOWN     ??? naproxen sodium (ALEVE) 220 MG tablet Take 220 mg by mouth daily as needed for pain.     ??? NON FORMULARY Take 240 mg by mouth. Frequency:QHS   Dosage:240   MG  Instructions:  Note:Dose: 240MG      ??? NON FORMULARY Take 25 mg by mouth. Frequency:PRN   Dosage:25   MG  Instructions:  Note:Dose: 25MG      ??? pravastatin (PRAVACHOL) 40 MG tablet Take 40 mg by mouth. Frequency:QHS   Dosage:40   MG  Instructions:  Note:Dose: 40 MG     ??? pregabalin (LYRICA) 25 MG capsule Take 1 capsule (25 mg total) by mouth Two (2) times a day. 60 capsule 5   ??? tacrolimus (PROGRAF) 1 MG capsule Take 1 capsule (1 mg total) by mouth Two (2) times a day. 60 capsule 11   ??? verapamil (CALAN-SR) 240 MG CR tablet      ??? ZANAFLEX 4 mg tablet Take 1/2 to 1 tablet every 8 hours PRN for spasms. 90 tablet 5     No current facility-administered medications for this visit.          PHYSICAL EXAM:  BP 152/81  - Pulse 92  - Temp 36.7 ??C (98.1 ??F) (Tympanic)  - Ht 163.8 cm (5' 4.49)  - Wt 92.2 kg (203 lb 4.8 oz)  - BMI 34.37 kg/m??     General Appearance:  NAD, well appearing and well nourished. Presents in a wheelchair with cane   HEENT:  [place]/AT. Well hydrated moist mucous membranes of the oral cavity. No scleral icterus. Neck supple; no JVD. No cervical lymphadenopathy.   Pulmonary:    Normal respiratory effort. CTAB, without wheezes/crackles/rhonchi. Good air movement.    Cardiovascular:  Regular rate and rhythm, no murmur noted.   Extremities No edema. No rash, lesions or petiche.   Abdomen:   Normoactive bowel sounds, abdomen soft, non-tender and not distended, no Hepatosplenomegaly or masses. Abdominal scar well healed without hernia. Musculoskeletal: No joint tenderness, impaired gait    Skin: Skin color, texture, turgor normal, skin  tag to right upper chest   Neurologic: Alert and oriented to person, place, and time. No motor abnormalities noted.  Sensation grossly intact.   Psychiatric: Judgement and insight appropriate.        LAB RESULTS:  All lab results last 24 hours:    Recent Results (from the past 48 hour(s))   Comprehensive Metabolic Panel    Collection Time: 11/25/16  8:41 AM   Result Value Ref Range    Sodium 139 135 - 145 mmol/L    Potassium 4.5 3.5 - 5.0 mmol/L    Chloride 104 98 - 107 mmol/L    CO2 27.0 22.0 - 30.0 mmol/L    BUN 29 (H) 7 - 21 mg/dL    Creatinine 5.36 (H) 0.60 - 1.00 mg/dL    BUN/Creatinine Ratio 26     EGFR MDRD Non Af Amer 48 (L) >=60 mL/min/1.45m2    EGFR MDRD Af Amer 59 (L) >=60 mL/min/1.20m2    Anion Gap 8 (L) 9 - 15 mmol/L    Glucose 109 65 - 179 mg/dL    Calcium 9.2 8.5 - 64.4 mg/dL    Albumin 4.1 3.5 - 5.0 g/dL    Total Protein 6.6 6.5 - 8.3 g/dL    Total Bilirubin 0.6 0.0 - 1.2 mg/dL    AST 31 14 - 38 U/L    ALT 45 15 - 48 U/L    Alkaline Phosphatase 65 38 - 126 U/L   Bilirubin, Direct    Collection Time: 11/25/16  8:41 AM   Result Value Ref Range    Bilirubin, Direct 0.20 0.00 - 0.40 mg/dL   Phosphorus Level    Collection Time: 11/25/16  8:41 AM   Result Value Ref Range    Phosphorus 3.9 2.9 - 4.7 mg/dL   Magnesium Level    Collection Time: 11/25/16  8:41 AM   Result Value Ref Range    Magnesium 1.7 1.6 - 2.2 mg/dL   Gamma GT    Collection Time: 11/25/16  8:41 AM   Result Value Ref Range    GGT 61 (H) 11 - 48 U/L   CBC w/ Differential    Collection Time: 11/25/16  8:41 AM   Result Value Ref Range    WBC 10.8 4.5 - 11.0 10*9/L    RBC 4.76 4.00 - 5.20 10*12/L    HGB 14.5 12.0 - 16.0 g/dL    HCT 03.4 74.2 - 59.5 %    MCV 94.7 80.0 - 100.0 fL    MCH 30.5 26.0 - 34.0 pg    MCHC 32.2 31.0 - 37.0 g/dL    RDW 63.8 75.6 - 43.3 %    MPV 8.3 7.0 - 10.0 fL    Platelet 291 150 - 440 10*9/L    Absolute Neutrophils 7.1 2.0 - 7.5 10*9/L    Absolute Lymphocytes 2.6 1.5 - 5.0 10*9/L    Absolute Monocytes 0.6 0.2 - 0.8 10*9/L    Absolute Eosinophils 0.3 0.0 - 0.4 10*9/L    Absolute Basophils 0.1 0.0 - 0.1 10*9/L    Large Unstained Cells 2 0 - 4 %       IMAGING:  Chest XRay 11/25/2016  Lungs are well-inflated.  No focal consolidation or pulmonary edema.  No sizable pleural effusions or pneumothorax.  Cardiomediastinal silhouette is unchanged. Tortuous descending thoracic aorta.  No acute osseous abnormality.  _______________________________________________        Ermalinda Barrios, DNP, APRN, FNP-C  [facility]  [address]  [place], [place]  [address]

## 2016-11-25 ENCOUNTER — Ambulatory Visit
Admission: RE | Admit: 2016-11-25 | Discharge: 2016-11-25 | Disposition: A | Discharge status: Home / Self Care | Payer: MEDICARE

## 2016-11-25 ENCOUNTER — Ambulatory Visit
Admission: RE | Admit: 2016-11-25 | Discharge: 2016-11-25 | Disposition: A | Discharge status: Home / Self Care | Admitting: Family

## 2016-11-25 ENCOUNTER — Ambulatory Visit: Admission: RE | Admit: 2016-11-25 | Discharge: 2016-11-25 | Disposition: A | Discharge status: Home / Self Care

## 2016-11-25 DIAGNOSIS — Z9889 Other specified postprocedural states: Secondary | ICD-10-CM | POA: Diagnosis not present

## 2016-11-25 DIAGNOSIS — M959 Acquired deformity of musculoskeletal system, unspecified: Secondary | ICD-10-CM | POA: Diagnosis not present

## 2016-11-25 DIAGNOSIS — Z23 Encounter for immunization: Secondary | ICD-10-CM | POA: Diagnosis not present

## 2016-11-25 DIAGNOSIS — M549 Dorsalgia, unspecified: Secondary | ICD-10-CM | POA: Diagnosis not present

## 2016-11-25 DIAGNOSIS — Z87891 Personal history of nicotine dependence: Secondary | ICD-10-CM | POA: Diagnosis not present

## 2016-11-25 DIAGNOSIS — Z79899 Other long term (current) drug therapy: Secondary | ICD-10-CM | POA: Diagnosis not present

## 2016-11-25 DIAGNOSIS — D899 Disorder involving the immune mechanism, unspecified: Secondary | ICD-10-CM | POA: Diagnosis not present

## 2016-11-25 DIAGNOSIS — Z4823 Encounter for aftercare following liver transplant: Secondary | ICD-10-CM | POA: Diagnosis not present

## 2016-11-25 DIAGNOSIS — Z944 Liver transplant status: Secondary | ICD-10-CM | POA: Diagnosis not present

## 2016-11-25 DIAGNOSIS — K7469 Other cirrhosis of liver: Secondary | ICD-10-CM | POA: Diagnosis not present

## 2016-11-25 DIAGNOSIS — G8929 Other chronic pain: Secondary | ICD-10-CM | POA: Diagnosis not present

## 2016-11-25 DIAGNOSIS — D849 Immunodeficiency, unspecified: Secondary | ICD-10-CM | POA: Diagnosis not present

## 2016-11-25 LAB — CBC W/ AUTO DIFF
BASOPHILS ABSOLUTE COUNT: 0.1 10*9/L (ref 0.0–0.1)
EOSINOPHILS ABSOLUTE COUNT: 0.3 10*9/L (ref 0.0–0.4)
HEMATOCRIT: 45.1 % (ref 36.0–46.0)
HEMOGLOBIN: 14.5 g/dL (ref 12.0–16.0)
LARGE UNSTAINED CELLS: 2 % (ref 0–4)
LYMPHOCYTES ABSOLUTE COUNT: 2.6 10*9/L (ref 1.5–5.0)
MEAN CORPUSCULAR HEMOGLOBIN: 30.5 pg (ref 26.0–34.0)
MEAN CORPUSCULAR VOLUME: 94.7 fL (ref 80.0–100.0)
MEAN PLATELET VOLUME: 8.3 fL (ref 7.0–10.0)
MONOCYTES ABSOLUTE COUNT: 0.6 10*9/L (ref 0.2–0.8)
NEUTROPHILS ABSOLUTE COUNT: 7.1 10*9/L (ref 2.0–7.5)
PLATELET COUNT: 291 10*9/L (ref 150–440)
RED CELL DISTRIBUTION WIDTH: 13.6 % (ref 12.0–15.0)
WBC ADJUSTED: 10.8 10*9/L (ref 4.5–11.0)

## 2016-11-25 LAB — COMPREHENSIVE METABOLIC PANEL
ALBUMIN: 4.1 g/dL (ref 3.5–5.0)
ALKALINE PHOSPHATASE: 65 U/L (ref 38–126)
ALT (SGPT): 45 U/L (ref 15–48)
ANION GAP: 8 mmol/L — ABNORMAL LOW (ref 9–15)
AST (SGOT): 31 U/L (ref 14–38)
BILIRUBIN TOTAL: 0.6 mg/dL (ref 0.0–1.2)
BLOOD UREA NITROGEN: 29 mg/dL — ABNORMAL HIGH (ref 7–21)
BUN / CREAT RATIO: 26
CHLORIDE: 104 mmol/L (ref 98–107)
CO2: 27 mmol/L (ref 22.0–30.0)
CREATININE: 1.1 mg/dL — ABNORMAL HIGH (ref 0.60–1.00)
EGFR MDRD AF AMER: 59 mL/min/{1.73_m2} — ABNORMAL LOW (ref >=60)
EGFR MDRD NON AF AMER: 48 mL/min/{1.73_m2} — ABNORMAL LOW (ref >=60)
GLUCOSE RANDOM: 109 mg/dL (ref 65–179)
POTASSIUM: 4.5 mmol/L (ref 3.5–5.0)
PROTEIN TOTAL: 6.6 g/dL (ref 6.5–8.3)
SODIUM: 139 mmol/L (ref 135–145)

## 2016-11-25 LAB — GAMMA GLUTAMYL TRANSFERASE: Gamma glutamyl transferase:CCnc:Pt:Ser/Plas:Qn:: 61 — ABNORMAL HIGH

## 2016-11-25 LAB — TACROLIMUS, TROUGH: Lab: 3.7

## 2016-11-25 LAB — MEAN PLATELET VOLUME: Lab: 8.3

## 2016-11-25 LAB — MAGNESIUM: Magnesium:MCnc:Pt:Ser/Plas:Qn:: 1.7

## 2016-11-25 LAB — GLUCOSE RANDOM: Glucose:MCnc:Pt:Ser/Plas:Qn:: 109

## 2016-11-25 LAB — BILIRUBIN DIRECT: Bilirubin.glucuronidated:MCnc:Pt:Ser/Plas:Qn:: 0.2

## 2016-11-25 LAB — PHOSPHORUS: Phosphate:MCnc:Pt:Ser/Plas:Qn:: 3.9

## 2016-11-25 NOTE — Unmapped (Signed)
Shingrix given as ordered. See immunization record.

## 2016-11-25 NOTE — Unmapped (Addendum)
Neck X ray was not done today despite being ordered. Ermalinda Barrios FNP recommended coordinating with PCP to get it done locally. Called DR. Hall's office and left vm requesting return call to coordinate X Ray.

## 2016-11-25 NOTE — Unmapped (Addendum)
Pt was seen for her annual by Ermalinda Barrios FNP. Pt denies any n/v/f. She has occasional diarrhea. She has been struggling with back pain and is back at the pain clinic for management. Pt wanted to know about OTC pain management including NSAID use. Explained to her that with Tylenol maximum daily dose is 3g but not encouraged to be taking such high doses on a daily basis. Informed her that NSAIDs are discouraged because of their renal clearance which could add stress to the kidneys in light of Prograf use as well. However, if quality of life is significantly compromised pt could occasionally use NSAIDs with continued monitoring of her creatinine. Pt sees dermatologist annually for screening. She has mole on the right shoulder which dermatologist advised her to remove at home. NP recommended having PCP remove it for pt. BP is being managed by PCP and pt reports home BP readings in the 130s/70s. Pt also has deformed neck bone for which NP recommended XRAY. Called X ray and informed them of additional X ray when they perform Chest X ray.  Pt has new PCP. She did not bring her med list and will call coordinator with med list. She got Shingrix vaccine on this visit. She was educated on the need for a booster Shingrix vaccine in 2 months. She will also check with PCP if she needs pneumonia vaccine. Pt reports that she struggles with falling asleep at times. FNP recommended OTC Melatonin and occasional low dose Benadryl as needed. Pt is on disability. She is able to perform ADLs but has limited mobility due to her back pain. She uses a walker to ambulate. RTC in one year. Will cont to monitor labs. Spend 30 minutes on pt education.

## 2016-11-25 NOTE — Unmapped (Addendum)
Per Ermalinda Barrios FNP ,try taking over-the-counter  Melatonin to help with sleep. You can also try low dose benadryl, maximum 25 mg on some days when you struggle with falling asleep.

## 2016-12-01 NOTE — Unmapped (Signed)
Called pt's PCP for the 2nd time and left about coordinating further imaging for the deformity/growth around clavicle. Called pt and informed her of efforts to coordinate this with PCP. Pt stated she would call PCP herself or stop by the office and see if they will do it.

## 2016-12-01 NOTE — Unmapped (Signed)
Annual CXR and liver US reviewed by Florene Glen, MD on 11/29/2016. No interventions recommended. Will repeat in one year.

## 2016-12-01 NOTE — Unmapped (Signed)
Received call from [facility] with Dr. Margo Aye (PCP) returning my call. Explained to her that pt needed f/u imaging as explained in previous note today. She stated pt would need to be seen in office and she also needs notes from last clinic visit. Routed notes as requested. She stated pt can be seen next week and she would call pt to relay appt details. Pt was already aware of possibility of need to see PCP before any imaging os ordered.

## 2016-12-09 DIAGNOSIS — R1310 Dysphagia, unspecified: Secondary | ICD-10-CM | POA: Diagnosis not present

## 2016-12-09 DIAGNOSIS — Z6837 Body mass index (BMI) 37.0-37.9, adult: Secondary | ICD-10-CM | POA: Diagnosis not present

## 2016-12-13 ENCOUNTER — Ambulatory Visit (HOSPITAL_COMMUNITY)
Admission: RE | Admit: 2016-12-13 | Discharge: 2016-12-13 | Disposition: A | Discharge status: Home / Self Care | Payer: Medicare Other | Source: Ambulatory Visit | Attending: Internal Medicine | Admitting: Internal Medicine

## 2016-12-13 ENCOUNTER — Other Ambulatory Visit (HOSPITAL_COMMUNITY): Payer: Self-pay | Admitting: Internal Medicine

## 2016-12-13 DIAGNOSIS — M89319 Hypertrophy of bone, unspecified shoulder: Secondary | ICD-10-CM

## 2016-12-13 DIAGNOSIS — R1310 Dysphagia, unspecified: Secondary | ICD-10-CM | POA: Diagnosis not present

## 2016-12-13 DIAGNOSIS — M19011 Primary osteoarthritis, right shoulder: Secondary | ICD-10-CM | POA: Diagnosis not present

## 2016-12-13 DIAGNOSIS — M89311 Hypertrophy of bone, right shoulder: Secondary | ICD-10-CM | POA: Insufficient documentation

## 2016-12-14 NOTE — Unmapped (Signed)
[facility] Specialty Pharmacy Refill Coordination Note  Specialty Medication(s): PROGRAF 1MG  CAP      Tamara Morris, DOB: 1940/09/13  Phone: [phone] (home) , Alternate phone contact: N/A  Phone or address changes today?: No  All above HIPAA information was verified with patient.  Shipping Address: [address]  [place] [address]   Insurance changes? No    Completed refill call assessment today to schedule patient's medication shipment from the [facility] [phone]).      Confirmed the medication and dosage are correct and have not changed: Yes, regimen is correct and unchanged.    Confirmed patient started or stopped the following medications in the past month:  No, there are no changes reported at this time.    Are you tolerating your medication?:  Tamara Morris reports tolerating the medication.    ADHERENCE    (Below is required for Medicare Part B or Transplant patients only - per drug):   How many tablets were dispensed last month: 60  Patient currently has 10 DAYS remaining.    Did you miss any doses in the past 4 weeks? No missed doses reported.    FINANCIAL/SHIPPING    Delivery Scheduled: Yes, Expected medication delivery date: 12/22/16     Tamara Morris did not have any additional questions at this time.    Delivery address validated in FSI scheduling system: Yes, address listed in FSI is correct.    We will follow up with patient monthly for standard refill processing and delivery.      Thank you,  [name]   [facility] Specialty Technician

## 2016-12-20 MED FILL — PROGRAF/1MG/CAP: PROGRAF/1MG/CAP | 30 days supply | Qty: 60 | Fill #5

## 2016-12-28 ENCOUNTER — Other Ambulatory Visit (HOSPITAL_COMMUNITY): Payer: Self-pay | Admitting: Internal Medicine

## 2016-12-28 DIAGNOSIS — Z1231 Encounter for screening mammogram for malignant neoplasm of breast: Secondary | ICD-10-CM

## 2017-01-05 ENCOUNTER — Ambulatory Visit (HOSPITAL_COMMUNITY)
Admission: RE | Admit: 2017-01-05 | Discharge: 2017-01-05 | Disposition: A | Discharge status: Home / Self Care | Payer: Medicare Other | Source: Ambulatory Visit | Attending: Internal Medicine | Admitting: Internal Medicine

## 2017-01-05 DIAGNOSIS — Z1231 Encounter for screening mammogram for malignant neoplasm of breast: Secondary | ICD-10-CM | POA: Insufficient documentation

## 2017-01-05 DIAGNOSIS — Z79899 Other long term (current) drug therapy: Secondary | ICD-10-CM | POA: Diagnosis not present

## 2017-01-05 DIAGNOSIS — Z944 Liver transplant status: Secondary | ICD-10-CM | POA: Diagnosis not present

## 2017-01-05 NOTE — Unmapped (Signed)
Successfully faxed updated standing lab order to [facility] to two separate numbers as requested - one through epic [phone]) and then  thru outgoing fax to previious number and this one [phone].Marland Kitchen

## 2017-01-11 NOTE — Unmapped (Signed)
STILL HAS OVER 2 WEEK REMAINING. CALL BACK NEXT WEEK.

## 2017-01-16 DIAGNOSIS — R7301 Impaired fasting glucose: Secondary | ICD-10-CM | POA: Diagnosis not present

## 2017-01-16 DIAGNOSIS — E782 Mixed hyperlipidemia: Secondary | ICD-10-CM | POA: Diagnosis not present

## 2017-01-16 DIAGNOSIS — I10 Essential (primary) hypertension: Secondary | ICD-10-CM | POA: Diagnosis not present

## 2017-01-20 DIAGNOSIS — Z23 Encounter for immunization: Secondary | ICD-10-CM | POA: Diagnosis not present

## 2017-01-20 DIAGNOSIS — I10 Essential (primary) hypertension: Secondary | ICD-10-CM | POA: Diagnosis not present

## 2017-01-20 DIAGNOSIS — N183 Chronic kidney disease, stage 3 (moderate): Secondary | ICD-10-CM | POA: Diagnosis not present

## 2017-01-20 DIAGNOSIS — R7309 Other abnormal glucose: Secondary | ICD-10-CM | POA: Diagnosis not present

## 2017-01-20 DIAGNOSIS — E782 Mixed hyperlipidemia: Secondary | ICD-10-CM | POA: Diagnosis not present

## 2017-01-20 DIAGNOSIS — Z944 Liver transplant status: Secondary | ICD-10-CM | POA: Diagnosis not present

## 2017-01-20 DIAGNOSIS — K580 Irritable bowel syndrome with diarrhea: Secondary | ICD-10-CM | POA: Diagnosis not present

## 2017-01-24 NOTE — Unmapped (Signed)
[facility] Specialty Pharmacy Refill Coordination Note  Specialty Medication(s): PROGRAF 1MG   Additional Medications shipped: NO    [name], DOB: 07-17-1940  Phone: [phone] (home) , Alternate phone contact: N/A  Phone or address changes today?: No  All above HIPAA information was verified with patient.  Shipping Address: [address]  [place] [address]   Insurance changes? No    Completed refill call assessment today to schedule patient's medication shipment from the [facility] [phone]).      Confirmed the medication and dosage are correct and have not changed: Yes, regimen is correct and unchanged.    Confirmed patient started or stopped the following medications in the past month:  No, there are no changes reported at this time.    Are you tolerating your medication?:  Gertrue reports tolerating the medication.    ADHERENCE    (Below is required for Medicare Part B or Transplant patients only - per drug):   How many tablets were dispensed last month: 60  Patient currently has 10 remaining.    Did you miss any doses in the past 4 weeks? No missed doses reported.    FINANCIAL/SHIPPING    Delivery Scheduled: Yes, Expected medication delivery date: 01/26/17     Allanah did not have any additional questions at this time.    Delivery address validated in FSI scheduling system: Yes, address listed in FSI is correct.    We will follow up with patient monthly for standard refill processing and delivery.      Thank you,  Marletta Lor   [facility] Specialty Pharmacist

## 2017-01-25 MED FILL — PROGRAF/1MG/CAP: PROGRAF/1MG/CAP | 30 days supply | Qty: 60 | Fill #6

## 2017-02-10 DIAGNOSIS — Z79899 Other long term (current) drug therapy: Secondary | ICD-10-CM | POA: Diagnosis not present

## 2017-02-10 DIAGNOSIS — Z944 Liver transplant status: Secondary | ICD-10-CM | POA: Diagnosis not present

## 2017-02-14 NOTE — Unmapped (Signed)
Received vm from pt requesting return call regarding shingles vaccine. Called back and pt wanted to know when to get repeat shingles vaccine. Explained to her that she can get the repeat shingrix vaccine 2-6 months from the 1st one. She reported that she got labs last week. E mailed tpa to call for results.

## 2017-02-15 LAB — COMPREHENSIVE METABOLIC PANEL
ALKALINE PHOSPHATASE: 78 U/L
ALT (SGPT): 30 U/L
ALT (SGPT): 24 U/L
AST (SGOT): 28 U/L
AST (SGOT): 20 U/L
BILIRUBIN TOTAL: 0.3 mg/dL
BILIRUBIN TOTAL: 0.4 mg/dL
BLOOD UREA NITROGEN: 26 mg/dL — ABNORMAL HIGH
CALCIUM: 9.5 mg/dL
CHLORIDE: 102 mmol/L
CHLORIDE: 104 mmol/L
CO2: 24.3 mmol/L
CO2: 26.7 mmol/L
CREATININE: 0.92 mg/dL
EGFR MDRD NON AF AMER: 51 mL/min/{1.73_m2}
GLUCOSE RANDOM: 111 mg/dL — ABNORMAL HIGH
GLUCOSE RANDOM: 119 mg/dL — ABNORMAL HIGH
POTASSIUM: 4.8 mmol/L
POTASSIUM: 5.1 mmol/L
PROTEIN TOTAL: 6.6 g/dL
PROTEIN TOTAL: 7 g/dL
SODIUM: 138 mmol/L
SODIUM: 141 mmol/L

## 2017-02-15 LAB — BILIRUBIN DIRECT
Lab: 0.1
Lab: 0.1

## 2017-02-15 LAB — CBC W/ DIFFERENTIAL
BASOPHILS ABSOLUTE COUNT: 0.1 10*9/L
BASOPHILS ABSOLUTE COUNT: 0.1 10*9/L
EOSINOPHILS ABSOLUTE COUNT: 0.2 10*9/L
EOSINOPHILS ABSOLUTE COUNT: 0.3 10*9/L
HEMATOCRIT: 42.2 %
HEMATOCRIT: 42.7 %
HEMOGLOBIN: 13.8 g/dL
LYMPHOCYTES ABSOLUTE COUNT: 2.5 10*9/L
LYMPHOCYTES ABSOLUTE COUNT: 3 10*9/L
MONOCYTES ABSOLUTE COUNT: 0.7 10*9/L
MONOCYTES ABSOLUTE COUNT: 0.8 10*9/L
NEUTROPHILS ABSOLUTE COUNT: 5.5 10*9/L
NEUTROPHILS ABSOLUTE COUNT: 6.2 10*9/L
PLATELET COUNT: 239 10*9/L
PLATELET COUNT: 260 10*9/L
WBC ADJUSTED: 9.1 10*9/L

## 2017-02-15 LAB — ALBUMIN
Lab: 4.1
Lab: 4.3

## 2017-02-15 LAB — PHOSPHORUS
Lab: 3.1
Lab: 3.6

## 2017-02-15 LAB — MAGNESIUM
Lab: 1.8
Lab: 1.8

## 2017-02-15 LAB — GAMMA GLUTAMYL TRANSFERASE
Lab: 43
Lab: 59

## 2017-02-15 LAB — HEMOGLOBIN: Lab: 13.8

## 2017-02-15 LAB — PROTEIN TOTAL: Lab: 6.6

## 2017-02-15 LAB — TACROLIMUS, TROUGH
Lab: 3.6
Lab: 4.3

## 2017-02-15 LAB — GLUCOSE RANDOM: Lab: 119 — ABNORMAL HIGH

## 2017-02-15 LAB — LYMPHOCYTES ABSOLUTE COUNT: Lab: 2.5

## 2017-02-15 NOTE — Unmapped (Signed)
Called pt and left vm informing her 11/9 lab results have been received today and they are WNL.

## 2017-02-21 NOTE — Unmapped (Signed)
[facility] Specialty Pharmacy Refill Coordination Note  Medication: Prograf 1mg     Unable to reach patient to schedule shipment for medication being filled at [facility]. Left voicemail on phone.  As this is the 3rd unsuccessful attempt to reach the patient, no additional phone call attempts will be made at this time.      Phone numbers attempted: [phone] Judie Petit)  [phone] (H)  Last scheduled delivery: 01/25/2017    Please call the [facility] at [phone] (option 4) should you have any further questions.      Thanks,  [facility] Specialty Team

## 2017-02-27 NOTE — Unmapped (Signed)
Received notification from [facility] that pt was unreachable 3 times when they called trying to schedule medication shipment. Called pt and left vm advising her to contact pharmacy for shipment. Called pt's daughter Tamara Morris and asked her to pass message on to pt. She verbalized understanding.

## 2017-02-28 MED FILL — PROGRAF/1MG/CAP: PROGRAF/1MG/CAP | 30 days supply | Qty: 60 | Fill #7

## 2017-02-28 NOTE — Unmapped (Signed)
[facility] Specialty Pharmacy Refill and Clinical Coordination Note  Medication(s): Prograf 1    Tamara Morris, DOB: 01/20/1941  Phone: [phone] (home) , Alternate phone contact: N/A  Shipping address: [address]  [place] [address]  Phone or address changes today?: No  All above HIPAA information verified.  Insurance changes? No    Completed refill and clinical call assessment today to schedule patient's medication shipment from the [facility] [phone]).      MEDICATION RECONCILIATION    Confirmed the medication and dosage are correct and have not changed: Yes, regimen is correct and unchanged.    Were there any changes to your medication(s) in the past month:  No, there are no changes reported at this time.    ADHERENCE    Is this medicine transplant or covered by Medicare Part B? Yes.    Prograf 1 mg   Quantity filled last month: 60   # of tablets left on hand: 20      Did you miss any doses in the past 4 weeks? No missed doses reported.  Adherence counseling provided? Not needed     SIDE EFFECT MANAGEMENT    Are you tolerating your medication?:  Tamara Morris reports tolerating the medication.  Side effect management discussed: None      Therapy is appropriate and should be continued.    Evidence of clinical benefit: See Epic note from 11/25/16      FINANCIAL/SHIPPING    Delivery Scheduled: Yes, Expected medication delivery date: 03/02/17   Additional medications refilled: No additional medications/refills needed at this time.    [name] did not have any additional questions at this time.    Delivery address validated in FSI scheduling system: Yes, address listed above is correct.      We will follow up with patient monthly for standard refill processing and delivery.      Thank you,  Mickle Mallory   [facility] Specialty Pharmacist

## 2017-03-21 DIAGNOSIS — Z944 Liver transplant status: Secondary | ICD-10-CM | POA: Diagnosis not present

## 2017-03-21 DIAGNOSIS — Z79899 Other long term (current) drug therapy: Secondary | ICD-10-CM | POA: Diagnosis not present

## 2017-03-21 LAB — MAGNESIUM: Lab: 1.8

## 2017-03-21 LAB — CBC W/ DIFFERENTIAL
BASOPHILS ABSOLUTE COUNT: 0.1 10*9/L
EOSINOPHILS ABSOLUTE COUNT: 0.1 10*9/L
HEMOGLOBIN: 13.8 g/dL
LYMPHOCYTES ABSOLUTE COUNT: 1.8 10*9/L
MONOCYTES ABSOLUTE COUNT: 0.8 10*9/L
NEUTROPHILS ABSOLUTE COUNT: 7.3 10*9/L
WHITE BLOOD CELL COUNT: 10 10*9/L

## 2017-03-21 LAB — MACROCYTES: Lab: 0

## 2017-03-21 LAB — COMPREHENSIVE METABOLIC PANEL
ALT (SGPT): 35 U/L
AST (SGOT): 29 U/L
BILIRUBIN TOTAL: 0.4 mg/dL
CHLORIDE: 105 mmol/L
CO2: 25.7 mmol/L
CREATININE: 0.9 mg/dL
GLUCOSE RANDOM: 141 mg/dL — ABNORMAL HIGH
POTASSIUM: 4.9 mmol/L
PROTEIN TOTAL: 6.9 g/dL
SODIUM: 141 mmol/L

## 2017-03-21 LAB — PHOSPHORUS: Lab: 3.6

## 2017-03-21 LAB — BILIRUBIN DIRECT: Lab: 0.1

## 2017-03-21 LAB — ALBUMIN: Lab: 4.5

## 2017-03-21 LAB — GAMMA GLUTAMYL TRANSFERASE: Lab: 59

## 2017-03-21 LAB — CO2: Lab: 25.7

## 2017-03-22 NOTE — Unmapped (Signed)
[facility] Specialty Pharmacy Refill Coordination Note  Specialty Medication(s): Prograf 1mg   Additional Medications shipped: none    Tamara Morris, DOB: 02-Aug-1940  Phone: [phone] (home) , Alternate phone contact: N/A  Phone or address changes today?: No  All above HIPAA information was verified with patient.  Shipping Address: [address]  [place] [address]   Insurance changes? No    Completed refill call assessment today to schedule patient's medication shipment from the [facility] [phone]).      Confirmed the medication and dosage are correct and have not changed: Yes, regimen is correct and unchanged.    Confirmed patient started or stopped the following medications in the past month:  No, there are no changes reported at this time.    Are you tolerating your medication?:  Tamara Morris reports tolerating the medication.    ADHERENCE    Prograf 1 mg   Quantity filled last month: 60   # of tablets left on hand: 28    Did you miss any doses in the past 4 weeks? No missed doses reported.    FINANCIAL/SHIPPING    Delivery Scheduled: Yes, Expected medication delivery date: 03/30/17     Tamara Morris did not have any additional questions at this time.    Delivery address validated in FSI scheduling system: Yes, address listed in FSI is correct.    We will follow up with patient monthly for standard refill processing and delivery.      Thank you,  Lupita Shutter   [facility] Specialty Pharmacist

## 2017-03-24 LAB — TACROLIMUS, TROUGH: Lab: 4.5

## 2017-03-26 MED FILL — PROGRAF/1MG/CAP: PROGRAF/1MG/CAP | 30 days supply | Qty: 60 | Fill #8

## 2017-04-24 NOTE — Unmapped (Signed)
[facility] Specialty Pharmacy Refill Coordination Note  Specialty Medication(s): Prograf 1mg     Tamara Morris, DOB: 09-12-1940  Phone: [phone] (home) , Alternate phone contact: N/A  Phone or address changes today?: No  All above HIPAA information was verified with patient.  Shipping Address: [address]  [place] [address]   Insurance changes? No    Completed refill call assessment today to schedule patient's medication shipment from the [facility] [phone]).      Confirmed the medication and dosage are correct and have not changed: Yes, regimen is correct and unchanged.    Confirmed patient started or stopped the following medications in the past month:  No, there are no changes reported at this time.    Are you tolerating your medication?:  Tamara Morris reports tolerating the medication.    ADHERENCE    (Below is required for Medicare Part B or Transplant patients only - per drug):   How many tablets were dispensed last month:     Prograf 1 mg   Quantity filled last month: 60   # of tablets left on hand: 7 DAYS    Did you miss any doses in the past 4 weeks? No missed doses reported.    FINANCIAL/SHIPPING    Delivery Scheduled: Yes, Expected medication delivery date: 05/04/2017     Tamara Morris did not have any additional questions at this time.    Delivery address validated in FSI scheduling system: Yes, address listed in FSI is correct.    We will follow up with patient monthly for standard refill processing and delivery.      Thank you,  Tamala Fothergill   [facility] Specialty Technician

## 2017-04-28 DIAGNOSIS — Z79899 Other long term (current) drug therapy: Secondary | ICD-10-CM | POA: Diagnosis not present

## 2017-04-28 DIAGNOSIS — Z944 Liver transplant status: Secondary | ICD-10-CM | POA: Diagnosis not present

## 2017-04-28 LAB — CBC W/ DIFFERENTIAL
EOSINOPHILS ABSOLUTE COUNT: 0.4 10*9/L
HEMATOCRIT: 42.4 %
HEMOGLOBIN: 13.6 g/dL
LYMPHOCYTES ABSOLUTE COUNT: 2.5 10*9/L
MONOCYTES ABSOLUTE COUNT: 0.9 10*9/L
NEUTROPHILS ABSOLUTE COUNT: 5.2 10*9/L
PLATELET COUNT: 239 10*9/L
WBC ADJUSTED: 9.1 10*9/L

## 2017-04-28 LAB — COMPREHENSIVE METABOLIC PANEL
ALKALINE PHOSPHATASE: 74 U/L
ALT (SGPT): 25 U/L
AST (SGOT): 23 U/L
BILIRUBIN TOTAL: 0.4 mg/dL
BLOOD UREA NITROGEN: 20 mg/dL — ABNORMAL HIGH
CALCIUM: 9.2 mg/dL
CO2: 27.8 mmol/L
CREATININE: 0.93 mg/dL
EGFR MDRD NON AF AMER: 59 mL/min/{1.73_m2} — ABNORMAL LOW
POTASSIUM: 4.7 mmol/L
PROTEIN TOTAL: 6.6 g/dL

## 2017-04-28 LAB — HEMATOCRIT: Lab: 42.4

## 2017-04-28 LAB — BILIRUBIN DIRECT: Lab: 0.1

## 2017-04-28 LAB — MAGNESIUM: Lab: 1.8

## 2017-04-28 LAB — PHOSPHORUS: Lab: 3.3

## 2017-04-28 LAB — CO2: Lab: 27.8

## 2017-04-28 LAB — ALBUMIN: Lab: 4.3

## 2017-04-28 LAB — GAMMA GLUTAMYL TRANSFERASE: Lab: 49

## 2017-05-02 LAB — TACROLIMUS, TROUGH: Lab: 3.9

## 2017-05-02 MED FILL — PROGRAF/1MG/CAP: PROGRAF/1MG/CAP | 30 days supply | Qty: 60 | Fill #9

## 2017-05-02 NOTE — Unmapped (Signed)
Called pt's lab for 1/25 tac. Faxed and entered.

## 2017-05-23 DIAGNOSIS — R7301 Impaired fasting glucose: Secondary | ICD-10-CM | POA: Diagnosis not present

## 2017-05-23 DIAGNOSIS — K580 Irritable bowel syndrome with diarrhea: Secondary | ICD-10-CM | POA: Diagnosis not present

## 2017-05-23 DIAGNOSIS — E782 Mixed hyperlipidemia: Secondary | ICD-10-CM | POA: Diagnosis not present

## 2017-05-23 DIAGNOSIS — G8929 Other chronic pain: Secondary | ICD-10-CM | POA: Diagnosis not present

## 2017-05-23 DIAGNOSIS — I10 Essential (primary) hypertension: Secondary | ICD-10-CM | POA: Diagnosis not present

## 2017-05-25 NOTE — Unmapped (Signed)
[facility] Specialty Pharmacy Refill Coordination Note  Specialty Medication(s): PROGRAF 1MG   Additional Medications shipped: NO    Tamara Morris, DOB: 03-21-1941  Phone: [phone] (home) , Alternate phone contact: N/A  Phone or address changes today?: No  All above HIPAA information was verified with patient.  Shipping Address: [address]  [place] [address]   Insurance changes? No    Completed refill call assessment today to schedule patient's medication shipment from the [facility] [phone]).      Confirmed the medication and dosage are correct and have not changed: Yes, regimen is correct and unchanged.    Confirmed patient started or stopped the following medications in the past month:  No, there are no changes reported at this time.    Are you tolerating your medication?:  Luverta reports tolerating the medication.    ADHERENCE    (Below is required for Medicare Part B or Transplant patients only - per drug):   How many tablets were dispensed last month: 60  Patient currently has 20 CAPS remaining.    Did you miss any doses in the past 4 weeks? No missed doses reported.    FINANCIAL/SHIPPING    Delivery Scheduled: Yes, Expected medication delivery date: 05/31/17     The patient will receive an FSI print out for each medication shipped and additional FDA Medication Guides as required.  Patient education from [place] or Robet Leu may also be included in the shipment    Aston did not have any additional questions at this time.    Delivery address validated in FSI scheduling system: Yes, address listed in FSI is correct.    We will follow up with patient monthly for standard refill processing and delivery.      Thank you,  Marletta Lor   [facility] Specialty Pharmacist

## 2017-05-26 DIAGNOSIS — F419 Anxiety disorder, unspecified: Secondary | ICD-10-CM | POA: Diagnosis not present

## 2017-05-26 DIAGNOSIS — E782 Mixed hyperlipidemia: Secondary | ICD-10-CM | POA: Diagnosis not present

## 2017-05-26 DIAGNOSIS — K580 Irritable bowel syndrome with diarrhea: Secondary | ICD-10-CM | POA: Diagnosis not present

## 2017-05-26 DIAGNOSIS — I10 Essential (primary) hypertension: Secondary | ICD-10-CM | POA: Diagnosis not present

## 2017-05-26 DIAGNOSIS — G8929 Other chronic pain: Secondary | ICD-10-CM | POA: Diagnosis not present

## 2017-05-26 DIAGNOSIS — N183 Chronic kidney disease, stage 3 (moderate): Secondary | ICD-10-CM | POA: Diagnosis not present

## 2017-05-26 DIAGNOSIS — R7301 Impaired fasting glucose: Secondary | ICD-10-CM | POA: Diagnosis not present

## 2017-05-26 DIAGNOSIS — R05 Cough: Secondary | ICD-10-CM | POA: Diagnosis not present

## 2017-05-26 DIAGNOSIS — Z6833 Body mass index (BMI) 33.0-33.9, adult: Secondary | ICD-10-CM | POA: Diagnosis not present

## 2017-05-26 DIAGNOSIS — Z944 Liver transplant status: Secondary | ICD-10-CM | POA: Diagnosis not present

## 2017-05-30 DIAGNOSIS — Z6833 Body mass index (BMI) 33.0-33.9, adult: Secondary | ICD-10-CM | POA: Diagnosis not present

## 2017-05-30 DIAGNOSIS — R05 Cough: Secondary | ICD-10-CM | POA: Diagnosis not present

## 2017-05-30 DIAGNOSIS — I10 Essential (primary) hypertension: Secondary | ICD-10-CM | POA: Diagnosis not present

## 2017-05-30 DIAGNOSIS — J069 Acute upper respiratory infection, unspecified: Secondary | ICD-10-CM | POA: Diagnosis not present

## 2017-05-30 MED FILL — PROGRAF/1MG/CAP: PROGRAF/1MG/CAP | 30 days supply | Qty: 60 | Fill #10

## 2017-05-31 NOTE — Unmapped (Signed)
Received vm from pt requesting return call. Called pt back and pt wanted to know if she can take Allegra for allergies. Informed he she can take it as long as it does not have the D ( decongestant). Pt also notified coordinator of statin medication change.Updated medication list.

## 2017-06-09 DIAGNOSIS — Z944 Liver transplant status: Secondary | ICD-10-CM | POA: Diagnosis not present

## 2017-06-09 DIAGNOSIS — Z79899 Other long term (current) drug therapy: Secondary | ICD-10-CM | POA: Diagnosis not present

## 2017-06-09 LAB — COMPREHENSIVE METABOLIC PANEL
ALT (SGPT): 27 U/L
BILIRUBIN TOTAL: 0.4 mg/dL
BLOOD UREA NITROGEN: 24 mg/dL — ABNORMAL HIGH
CALCIUM: 9.8 mg/dL
CHLORIDE: 102 mmol/L
CREATININE: 0.96 mg/dL
EGFR MDRD NON AF AMER: 57 mL/min/{1.73_m2} — ABNORMAL LOW
GLUCOSE RANDOM: 115 mg/dL — ABNORMAL HIGH
POTASSIUM: 4.6 mmol/L
PROTEIN TOTAL: 6.8 g/dL
SODIUM: 140 mmol/L

## 2017-06-09 LAB — CBC W/ DIFFERENTIAL
BASOPHILS ABSOLUTE COUNT: 0.1 10*9/L
EOSINOPHILS ABSOLUTE COUNT: 0.3 10*9/L
HEMATOCRIT: 45.5 % — ABNORMAL HIGH
HEMOGLOBIN: 14.3 g/dL
LYMPHOCYTES ABSOLUTE COUNT: 2.5 10*9/L
MONOCYTES ABSOLUTE COUNT: 0.8 10*9/L
NEUTROPHILS ABSOLUTE COUNT: 5.1 10*9/L
WHITE BLOOD CELL COUNT: 8.7 10*9/L

## 2017-06-09 LAB — PHOSPHORUS: Lab: 3.5

## 2017-06-09 LAB — MAGNESIUM: Lab: 1.8

## 2017-06-09 LAB — GAMMA GLUTAMYL TRANSFERASE: Lab: 47

## 2017-06-09 LAB — BLOOD UREA NITROGEN: Lab: 24 — ABNORMAL HIGH

## 2017-06-09 LAB — ALBUMIN: Lab: 4.4

## 2017-06-09 LAB — MONOCYTES RELATIVE PERCENT: Lab: 0

## 2017-06-09 LAB — BILIRUBIN DIRECT: Lab: 0.1

## 2017-06-12 LAB — TACROLIMUS LEVEL, TROUGH: TACROLIMUS, TROUGH: 7.8 ng/mL (ref <=20.0)

## 2017-06-12 LAB — TACROLIMUS, TROUGH: Lab: 7.8

## 2017-06-22 NOTE — Unmapped (Signed)
[facility] Specialty Pharmacy Refill and Clinical Coordination Note  Medication(s): Prograf 1mg  capsules    Tamara Morris, DOB: November 26, 1940  Phone: [phone] (home) , Alternate phone contact: N/A  Shipping address: [address]  [place] [address]  Phone or address changes today?: No  All above HIPAA information verified.  Insurance changes? No    Completed refill and clinical call assessment today to schedule patient's medication shipment from the [facility] [phone]).      MEDICATION RECONCILIATION    Confirmed the medication and dosage are correct and have not changed: Yes, regimen is correct and unchanged.    Were there any changes to your medication(s) in the past month:  Yes. Tamara Morris reports starting the following medications: patient stated her doctor changed Pravastatin to Simvastatin 10mg  tablets    ADHERENCE    Is this medicine transplant or covered by Medicare Part B? Yes.    Prograf 1 mg   Quantity filled last month: 60   # of tablets left on hand: 12          Did you miss any doses in the past 4 weeks? No missed doses reported.  Adherence counseling provided? Not needed     SIDE EFFECT MANAGEMENT    Are you tolerating your medication?:  Tamara Morris reports tolerating the medication.  Side effect management discussed: None      Therapy is appropriate and should be continued.    Evidence of clinical benefit: See Epic note from 11/25/16      FINANCIAL/SHIPPING    Delivery Scheduled: Yes, Expected medication delivery date: 06/27/17   Additional medications refilled: No additional medications/refills needed at this time.    The patient will receive an FSI print out for each medication shipped and additional FDA Medication Guides as required.  Patient education from [place] or Robet Leu may also be included in the shipment.    Tamara Morris did not have any additional questions at this time.    Delivery address validated in FSI scheduling system: Yes, address listed above is correct.      We will follow up with patient monthly for standard refill processing and delivery.      Thank you,  Mervyn Gay   [facility] Specialty Pharmacist

## 2017-06-23 DIAGNOSIS — R05 Cough: Secondary | ICD-10-CM | POA: Diagnosis not present

## 2017-06-23 DIAGNOSIS — R7301 Impaired fasting glucose: Secondary | ICD-10-CM | POA: Diagnosis not present

## 2017-06-23 DIAGNOSIS — G8929 Other chronic pain: Secondary | ICD-10-CM | POA: Diagnosis not present

## 2017-06-23 DIAGNOSIS — I10 Essential (primary) hypertension: Secondary | ICD-10-CM | POA: Diagnosis not present

## 2017-06-23 DIAGNOSIS — E782 Mixed hyperlipidemia: Secondary | ICD-10-CM | POA: Diagnosis not present

## 2017-06-23 DIAGNOSIS — Z6835 Body mass index (BMI) 35.0-35.9, adult: Secondary | ICD-10-CM | POA: Diagnosis not present

## 2017-06-23 DIAGNOSIS — K580 Irritable bowel syndrome with diarrhea: Secondary | ICD-10-CM | POA: Diagnosis not present

## 2017-06-26 MED FILL — PROGRAF/1MG/CAP: PROGRAF/1MG/CAP | 30 days supply | Qty: 60 | Fill #11

## 2017-06-30 NOTE — Unmapped (Signed)
Received call from pt reporting change in BP medication. Updated med list. Pt stated she would get labs next week.

## 2017-07-07 DIAGNOSIS — E782 Mixed hyperlipidemia: Secondary | ICD-10-CM | POA: Diagnosis not present

## 2017-07-07 DIAGNOSIS — Z6833 Body mass index (BMI) 33.0-33.9, adult: Secondary | ICD-10-CM | POA: Diagnosis not present

## 2017-07-07 DIAGNOSIS — I10 Essential (primary) hypertension: Secondary | ICD-10-CM | POA: Diagnosis not present

## 2017-07-07 DIAGNOSIS — Z944 Liver transplant status: Secondary | ICD-10-CM | POA: Diagnosis not present

## 2017-07-07 DIAGNOSIS — F419 Anxiety disorder, unspecified: Secondary | ICD-10-CM | POA: Diagnosis not present

## 2017-07-07 DIAGNOSIS — N183 Chronic kidney disease, stage 3 (moderate): Secondary | ICD-10-CM | POA: Diagnosis not present

## 2017-07-07 DIAGNOSIS — K580 Irritable bowel syndrome with diarrhea: Secondary | ICD-10-CM | POA: Diagnosis not present

## 2017-07-07 DIAGNOSIS — G8929 Other chronic pain: Secondary | ICD-10-CM | POA: Diagnosis not present

## 2017-07-07 DIAGNOSIS — R7301 Impaired fasting glucose: Secondary | ICD-10-CM | POA: Diagnosis not present

## 2017-07-07 DIAGNOSIS — J069 Acute upper respiratory infection, unspecified: Secondary | ICD-10-CM | POA: Diagnosis not present

## 2017-07-07 DIAGNOSIS — Z6834 Body mass index (BMI) 34.0-34.9, adult: Secondary | ICD-10-CM | POA: Diagnosis not present

## 2017-07-07 DIAGNOSIS — R05 Cough: Secondary | ICD-10-CM | POA: Diagnosis not present

## 2017-07-27 MED ORDER — PROGRAF 1 MG CAPSULE
ORAL_CAPSULE | ORAL | 11 refills | 0.00000 days | Status: CP
Start: 2017-07-27 — End: 2018-06-19

## 2017-07-27 MED ORDER — PROGRAF 1 MG CAPSULE: 1 mg | capsule | 11 refills | 0 days

## 2017-07-27 MED ORDER — TACROLIMUS 1 MG CAPSULE
ORAL_CAPSULE | Freq: Two times a day (BID) | ORAL | 11 refills | 0.00000 days | Status: CP
Start: 2017-07-27 — End: 2017-07-27

## 2017-07-27 NOTE — Unmapped (Signed)
[facility] Specialty Pharmacy Refill and Clinical Coordination Note  Medication(s): PROGRAF 1MG     [name], DOB: September 21, 1940  Phone: [phone] (home) , Alternate phone contact: N/A  Shipping address: [address]  [place] [address]  Phone or address changes today?: No  All above HIPAA information verified.  Insurance changes? No    Completed refill and clinical call assessment today to schedule patient's medication shipment from the [facility] [phone]).      MEDICATION RECONCILIATION    Confirmed the medication and dosage are correct and have not changed: Yes, regimen is correct and unchanged.    Were there any changes to your medication(s) in the past month:  No, there are no changes reported at this time.    ADHERENCE    Is this medicine transplant or covered by Medicare Part B? Yes.    Prograf 1 mg   Quantity filled last month: 60   # of tablets left on hand: 7 DAYS LEFT    Did you miss any doses in the past 4 weeks? No missed doses reported.  Adherence counseling provided? Not needed     SIDE EFFECT MANAGEMENT    Are you tolerating your medication?:  Zanita reports tolerating the medication.  Side effect management discussed: None      Therapy is appropriate and should be continued.    Evidence of clinical benefit: See Epic note from 11/25/16      FINANCIAL/SHIPPING    Delivery Scheduled: Yes, Expected medication delivery date: 07/31/17 via courier or UPS.  However, Rx request for refills was sent to the provider as there are none remaining.   Additional medications refilled: No additional medications/refills needed at this time.    The patient will receive an FSI print out for each medication shipped and additional FDA Medication Guides as required.  Patient education from [place] or Robet Leu may also be included in the shipment.    [name] did not have any additional questions at this time.    Delivery address validated in FSI scheduling system: Yes, address listed above is correct.      We will follow up with patient monthly for standard refill processing and delivery.      Thank you,  Thad Ranger   [facility] Specialty Pharmacist

## 2017-07-27 NOTE — Unmapped (Signed)
Covering coordinator already sent in script

## 2017-07-27 NOTE — Unmapped (Signed)
Pt request for RX Refill

## 2017-07-28 DIAGNOSIS — F419 Anxiety disorder, unspecified: Secondary | ICD-10-CM | POA: Diagnosis not present

## 2017-07-28 DIAGNOSIS — I10 Essential (primary) hypertension: Secondary | ICD-10-CM | POA: Diagnosis not present

## 2017-07-28 DIAGNOSIS — E782 Mixed hyperlipidemia: Secondary | ICD-10-CM | POA: Diagnosis not present

## 2017-07-28 DIAGNOSIS — K580 Irritable bowel syndrome with diarrhea: Secondary | ICD-10-CM | POA: Diagnosis not present

## 2017-07-28 DIAGNOSIS — Z6833 Body mass index (BMI) 33.0-33.9, adult: Secondary | ICD-10-CM | POA: Diagnosis not present

## 2017-07-28 DIAGNOSIS — J069 Acute upper respiratory infection, unspecified: Secondary | ICD-10-CM | POA: Diagnosis not present

## 2017-07-28 DIAGNOSIS — R05 Cough: Secondary | ICD-10-CM | POA: Diagnosis not present

## 2017-07-28 DIAGNOSIS — G8929 Other chronic pain: Secondary | ICD-10-CM | POA: Diagnosis not present

## 2017-07-28 DIAGNOSIS — N183 Chronic kidney disease, stage 3 (moderate): Secondary | ICD-10-CM | POA: Diagnosis not present

## 2017-07-28 DIAGNOSIS — R7301 Impaired fasting glucose: Secondary | ICD-10-CM | POA: Diagnosis not present

## 2017-07-28 DIAGNOSIS — Z79899 Other long term (current) drug therapy: Secondary | ICD-10-CM | POA: Diagnosis not present

## 2017-07-28 DIAGNOSIS — Z944 Liver transplant status: Secondary | ICD-10-CM | POA: Diagnosis not present

## 2017-07-28 DIAGNOSIS — Z6834 Body mass index (BMI) 34.0-34.9, adult: Secondary | ICD-10-CM | POA: Diagnosis not present

## 2017-07-28 LAB — CBC W/ DIFFERENTIAL
EOSINOPHILS ABSOLUTE COUNT: 0.3 10*9/L
HEMATOCRIT: 43.6 %
HEMOGLOBIN: 13.9 g/dL
LYMPHOCYTES ABSOLUTE COUNT: 2.4 10*9/L
MONOCYTES ABSOLUTE COUNT: 0.8 10*9/L
NEUTROPHILS ABSOLUTE COUNT: 5.6 10*9/L
PLATELET COUNT: 283 10*9/L
WBC ADJUSTED: 9.2 10*9/L

## 2017-07-28 LAB — COMPREHENSIVE METABOLIC PANEL
ALT (SGPT): 22 U/L
AST (SGOT): 22 U/L
BILIRUBIN TOTAL: 0.4 mg/dL
BLOOD UREA NITROGEN: 24 mg/dL — ABNORMAL HIGH
CALCIUM: 9.6 mg/dL
CHLORIDE: 103 mmol/L
CO2: 25.5 mmol/L
CREATININE: 1.04 mg/dL
EGFR MDRD NON AF AMER: 52 mL/min/{1.73_m2}
PROTEIN TOTAL: 6.9 g/dL
SODIUM: 140 mmol/L

## 2017-07-28 LAB — ALBUMIN: Lab: 4.4

## 2017-07-28 LAB — GAMMA GLUTAMYL TRANSFERASE: Lab: 31

## 2017-07-28 LAB — CHLORIDE: Lab: 103

## 2017-07-28 LAB — BASOPHILS RELATIVE PERCENT: Lab: 0

## 2017-07-28 LAB — PHOSPHORUS: Lab: 3.2

## 2017-07-28 LAB — MAGNESIUM: Lab: 1.9

## 2017-07-28 LAB — BILIRUBIN DIRECT: Lab: 0.1

## 2017-07-28 MED FILL — PROGRAF/1MG/CAP: PROGRAF/1MG/CAP | 30 days supply | Qty: 60 | Fill #0

## 2017-07-28 NOTE — Unmapped (Signed)
Faxed thru Epic updated standing lab order to [facility] [phone] following call that pt was there and lab order had expired.

## 2017-07-31 LAB — TACROLIMUS, TROUGH: Lab: 4.4

## 2017-08-16 DIAGNOSIS — N183 Chronic kidney disease, stage 3 (moderate): Secondary | ICD-10-CM | POA: Diagnosis not present

## 2017-08-16 DIAGNOSIS — E782 Mixed hyperlipidemia: Secondary | ICD-10-CM | POA: Diagnosis not present

## 2017-08-16 DIAGNOSIS — I10 Essential (primary) hypertension: Secondary | ICD-10-CM | POA: Diagnosis not present

## 2017-08-16 DIAGNOSIS — R7301 Impaired fasting glucose: Secondary | ICD-10-CM | POA: Diagnosis not present

## 2017-08-18 ENCOUNTER — Other Ambulatory Visit (HOSPITAL_COMMUNITY): Payer: Self-pay | Admitting: Internal Medicine

## 2017-08-18 DIAGNOSIS — R05 Cough: Secondary | ICD-10-CM | POA: Diagnosis not present

## 2017-08-18 DIAGNOSIS — E876 Hypokalemia: Secondary | ICD-10-CM | POA: Diagnosis not present

## 2017-08-18 DIAGNOSIS — R0989 Other specified symptoms and signs involving the circulatory and respiratory systems: Secondary | ICD-10-CM

## 2017-08-18 DIAGNOSIS — N183 Chronic kidney disease, stage 3 (moderate): Secondary | ICD-10-CM | POA: Diagnosis not present

## 2017-08-18 DIAGNOSIS — I10 Essential (primary) hypertension: Secondary | ICD-10-CM | POA: Diagnosis not present

## 2017-08-18 DIAGNOSIS — R7301 Impaired fasting glucose: Secondary | ICD-10-CM | POA: Diagnosis not present

## 2017-08-18 DIAGNOSIS — G8929 Other chronic pain: Secondary | ICD-10-CM | POA: Diagnosis not present

## 2017-08-18 DIAGNOSIS — K580 Irritable bowel syndrome with diarrhea: Secondary | ICD-10-CM | POA: Diagnosis not present

## 2017-08-18 DIAGNOSIS — E782 Mixed hyperlipidemia: Secondary | ICD-10-CM | POA: Diagnosis not present

## 2017-08-18 DIAGNOSIS — Z0000 Encounter for general adult medical examination without abnormal findings: Secondary | ICD-10-CM | POA: Diagnosis not present

## 2017-08-18 DIAGNOSIS — Z944 Liver transplant status: Secondary | ICD-10-CM | POA: Diagnosis not present

## 2017-08-18 DIAGNOSIS — F419 Anxiety disorder, unspecified: Secondary | ICD-10-CM | POA: Diagnosis not present

## 2017-08-21 NOTE — Unmapped (Signed)
[facility] Specialty Pharmacy Refill Coordination Note  Specialty Medication(s): Prograf  Additional Medications shipped: na    Tamara Morris, DOB: 1941/02/19  Phone: [phone] (home) , Alternate phone contact: N/A  Phone or address changes today?: No  All above HIPAA information was verified with patient.  Shipping Address: [address]  [place] [address]   Insurance changes? No    Completed refill call assessment today to schedule patient's medication shipment from the [facility] [phone]).      Confirmed the medication and dosage are correct and have not changed: Yes, regimen is correct and unchanged.    Confirmed patient started or stopped the following medications in the past month:  No, there are no changes reported at this time.    Are you tolerating your medication?:  Tamara Morris reports tolerating the medication.    ADHERENCE  Not part b    Did you miss any doses in the past 4 weeks? No missed doses reported.    FINANCIAL/SHIPPING    Delivery Scheduled: Yes, Expected medication delivery date: Monday, May 27     The patient will receive an FSI print out for each medication shipped and additional FDA Medication Guides as required.  Patient education from [place] or Robet Leu may also be included in the shipment    Tamara Morris did not have any additional questions at this time.    Delivery address validated in FSI scheduling system: Yes, address listed in FSI is correct.    We will follow up with patient monthly for standard refill processing and delivery.      Thank you,  Tawanna Solo Shared [facility] Specialty Pharmacist

## 2017-08-23 ENCOUNTER — Ambulatory Visit (HOSPITAL_COMMUNITY)
Admission: RE | Admit: 2017-08-23 | Discharge: 2017-08-23 | Disposition: A | Discharge status: Home / Self Care | Payer: Medicare Other | Source: Ambulatory Visit | Attending: Internal Medicine | Admitting: Internal Medicine

## 2017-08-23 DIAGNOSIS — I6523 Occlusion and stenosis of bilateral carotid arteries: Secondary | ICD-10-CM | POA: Diagnosis not present

## 2017-08-23 DIAGNOSIS — R0989 Other specified symptoms and signs involving the circulatory and respiratory systems: Secondary | ICD-10-CM | POA: Diagnosis not present

## 2017-08-25 MED FILL — PROGRAF/1MG/CAP: PROGRAF/1MG/CAP | 30 days supply | Qty: 60 | Fill #1

## 2017-08-25 NOTE — Unmapped (Signed)
Received vm from pt requesting return call. Called back and she stated she was recently started on hydralazine 25 mg BID and her BP is now well controlled. Pt stated she has arterial narrowing and is scheduled to see local cardiovascular surgeon next Wednesday. Pt stated she would notify coordinator of plan of care wants she goes to consultation.

## 2017-08-30 ENCOUNTER — Encounter: Payer: Self-pay | Admitting: Vascular Surgery

## 2017-08-30 ENCOUNTER — Ambulatory Visit (INDEPENDENT_AMBULATORY_CARE_PROVIDER_SITE_OTHER): Payer: Medicare Other | Admitting: Vascular Surgery

## 2017-08-30 ENCOUNTER — Other Ambulatory Visit: Payer: Self-pay

## 2017-08-30 VITALS — BP 138/62 | HR 95 | Resp 18 | Ht 64.5 in | Wt 204.5 lb

## 2017-08-30 DIAGNOSIS — I6523 Occlusion and stenosis of bilateral carotid arteries: Secondary | ICD-10-CM

## 2017-08-30 NOTE — Progress Notes (Signed)
Patient name: Linda Barr MRN: [id number] DOB: 04-Mar-1941 Sex: female   REASON FOR CONSULT:    Bilateral carotid disease.  The consult is requested by Garnet Sierras  HPI:   [name] is a pleasant 77 y.o. female, who was referred for evaluation of bilateral carotid disease.  I have reviewed the records from the referring office.  The patient was seen on 08/18/2017 for a physical and labs.  The patient was noted to have a left carotid bruit which prompted a carotid duplex scan.  This showed a moderate 50 to 69% right carotid stenosis with a less than 50% left carotid stenosis.  She is on aspirin and is on a statin.  The patient is right-handed.  She denies any history of stroke, TIAs, expressive or receptive aphasia, or amaurosis fugax.  Patient does have arthritis of both knees and also has back pain.  She has had 2 previous back operations.  I do not get any history of claudication or rest pain.  She has no history of nonhealing ulcers.  Past Medical History:  Diagnosis Date  . Anxiety   . Arthritis   . Chronic back pain   . Essential hypertension    History of ventricular [place], followed up with cardiologist ,   . Gout   . History of kidney stones   . Hx of liver transplant ([place])    1994, [facility] at [facility]  . Hyperlipidemia   . Kidney stones   . Palpitations     Family History  Problem Relation Age of Onset  . Stroke Mother   . Stroke Other   There is no family history of premature cardiovascular disease.  SOCIAL HISTORY: She has a remote history of tobacco use. Social History   Socioeconomic History  . Marital status: Widowed    Spouse name: Not on file  . Number of children: Not on file  . Years of education: Not on file  . Highest education level: Not on file  Occupational History  . Not on file  Social Needs  . Financial resource strain: Not on file  . Food insecurity:    Worry: Not on file    Inability: Not on file  . Transportation needs:    Medical:  Not on file    Non-medical: Not on file  Tobacco Use  . Smoking status: Former Smoker    Packs/day: 1.00    Years: 12.00    Pack years: 12.00    Types: Cigarettes    Last attempt to quit: 04/29/1990    Years since quitting: 27.3  . Smokeless tobacco: Former Systems developer    Quit date: 04/04/1989  Substance and Sexual Activity  . Alcohol use: No  . Drug use: No  . Sexual activity: Never    Birth control/protection: None  Lifestyle  . Physical activity:    Days per week: Not on file    Minutes per session: Not on file  . Stress: Not on file  Relationships  . Social connections:    Talks on phone: Not on file    Gets together: Not on file    Attends religious service: Not on file    Active member of club or organization: Not on file    Attends meetings of clubs or organizations: Not on file    Relationship status: Not on file  . Intimate partner violence:    Fear of current or ex partner: Not on file    Emotionally abused: Not  on file    Physically abused: Not on file    Forced sexual activity: Not on file  Other Topics Concern  . Not on file  Social History Narrative  . Not on file    Allergies  Allergen Reactions  . Codeine Other (See Comments)    Hallucinate, vomiting and sweating  . Demerol [Meperidine] Other (See Comments)    Hallucinations, vomiting and sweating.   . Dilaudid [Hydromorphone Hcl] Other (See Comments)    Hallucinations, sweating and vomiting  . Morphine And Related Other (See Comments)    Hallucinations, vomiting and sweating    Current Outpatient Medications  Medication Sig Dispense Refill  . acetaminophen (TYLENOL) 500 MG tablet Take 500 mg by mouth every 6 (six) hours as needed for mild pain or moderate pain.    Marland Kitchen aspirin 81 MG tablet Take 81 mg by mouth daily as needed (for palpitations).     . benzonatate (TESSALON) 100 MG capsule Take by mouth 3 (three) times daily as needed for cough.    . hydrALAZINE (APRESOLINE) 25 MG tablet Take 25 mg by mouth  2 (two) times daily.  1  . olmesartan (BENICAR) 20 MG tablet Take 20 mg by mouth daily.  1  . simvastatin (ZOCOR) 10 MG tablet TAKE 1 TABLET BY MOUTH EVERY DAY AT NIGHT  0  . sodium chloride (OCEAN) 0.65 % SOLN nasal spray Place 1 spray into both nostrils every 4 (four) hours as needed for congestion.    . tacrolimus (PROGRAF) 1 MG capsule Take 1 mg by mouth 2 (two) times daily.    . verapamil (CALAN-SR) 240 MG CR tablet Take 240 mg by mouth daily.    Marland Kitchen lisinopril (PRINIVIL,ZESTRIL) 20 MG tablet Take 20 mg by mouth daily.    . pravastatin (PRAVACHOL) 40 MG tablet Take 40 mg by mouth at bedtime.     No current facility-administered medications for this visit.     REVIEW OF SYSTEMS:  [X]  denotes positive finding, [ ]  denotes negative finding Cardiac  Comments:  Chest pain or chest pressure:    Shortness of breath upon exertion:    Short of breath when lying flat:    Irregular heart rhythm:        Vascular    Pain in calf, thigh, or hip brought on by ambulation: x   Pain in feet at night that wakes you up from your sleep:     Blood clot in your veins:    Leg swelling:         Pulmonary    Oxygen at home:    Productive cough:     Wheezing:         Neurologic    Sudden weakness in arms or legs:     Sudden numbness in arms or legs:     Sudden onset of difficulty speaking or slurred speech:    Temporary loss of vision in one eye:     Problems with dizziness:         Gastrointestinal    Blood in stool:     Vomited blood:         Genitourinary    Burning when urinating:     Blood in urine:        Psychiatric    Major depression:         Hematologic    Bleeding problems:    Problems with blood clotting too easily:        Skin  Rashes or ulcers:        Constitutional    Fever or chills:     PHYSICAL EXAM:   Vitals:   08/30/17 1514 08/30/17 1515  BP: 130/72 138/62  Pulse: 95   Resp: 18   SpO2: 95%   Weight: 204 lb 8 oz (92.8 kg)   Height: 5' 4.5" (1.638 m)       GENERAL: The patient is a well-nourished female, in no acute distress. The vital signs are documented above. CARDIAC: There is a regular rate and rhythm.  VASCULAR: He has bilateral carotid bruits. On the right side she has a palpable femoral pulse and dorsalis pedis pulse. On the left side she has a palpable femoral pulse.  I cannot palpate pedal pulses. Both feet are warm and well-perfused. She has no significant lower extremity swelling. PULMONARY: There is good air exchange bilaterally without wheezing or rales. ABDOMEN: Soft and non-tender with normal pitched bowel sounds.  MUSCULOSKELETAL: There are no major deformities or cyanosis. NEUROLOGIC: No focal weakness or paresthesias are detected. SKIN: There are no ulcers or rashes noted. PSYCHIATRIC: The patient has a normal affect.  DATA:    CAROTID DUPLEX: I have reviewed the carotid duplex scan that was done on 08/23/2017 in [place].  This showed a 50 to 69% right carotid stenosis and a less than 50% left carotid stenosis.  MEDICAL ISSUES:   CAROTID DISEASE: This patient has an asymptomatic 50 to 69% right carotid stenosis with no significant stenosis on the left.  I have explained that we would not consider right carotid endarterectomy unless the stenosis progressed to greater than 80% or she develop new neurologic symptoms.  We have reviewed the symptoms of stroke.  If she developed any new neurologic symptoms or the stenosis progressed to greater than 80%, then we would consider carotid endarterectomy.  Fortunately, she is not a smoker.  She is on aspirin and is on a statin.  I have ordered a follow-up carotid duplex scan and I will see her back in 6 months.  She knows to call sooner if she has problems.  Deitra Mayo [facility] [phone]

## 2017-08-31 NOTE — Unmapped (Signed)
Received call from pt stating she was seen by vascular surgery and does not need any interventions for the arterial blockage that was noted. Pt was very excited about that and her cholesterol which has dropped down to the 150s. She stated she would get labs next week.

## 2017-09-12 NOTE — Unmapped (Signed)
Called pt to discuss availability for annual appt. Said she is available MWF and can start around 8 am. Told her she'd get call back before letter sent, and she verbalized understanding of all discussed.

## 2017-09-14 DIAGNOSIS — Z79899 Other long term (current) drug therapy: Secondary | ICD-10-CM | POA: Diagnosis not present

## 2017-09-14 DIAGNOSIS — Z944 Liver transplant status: Secondary | ICD-10-CM | POA: Diagnosis not present

## 2017-09-15 ENCOUNTER — Other Ambulatory Visit: Payer: Self-pay

## 2017-09-15 DIAGNOSIS — I6523 Occlusion and stenosis of bilateral carotid arteries: Secondary | ICD-10-CM

## 2017-09-22 NOTE — Unmapped (Signed)
[facility] Pharm[facility] Refill Coordination Note  Specialty Medication(s): Prograf 1mg   Additional Medications shipped: none    Tamara Morris, DOB: 08/13/1940  Phone: [phone] (home) , Alternate phone contact: N/A  Phone or address changes today?: No  All above HIPAA information was verified with patient.  Shipping Address: [address]  [place] [address]   Insurance changes? No    Completed refill call assessment today to schedule patient's medication shipment from the [facility] [phone]).      Confirmed the medication and dosage are correct and have not changed: Yes, regimen is correct and unchanged.    Confirmed patient started or stopped the following medications in the past month:  No, there are no changes reported at this time.    Are you tolerating your medication?:  Tamara Morris reports tolerating the medication.    ADHERENCE    Prograf 1 mg   Quantity filled last month: 60   # of tablets left on hand: 7 days      Did you miss any doses in the past 4 weeks? No missed doses reported.    FINANCIAL/SHIPPING    Delivery Scheduled: Yes, Expected medication delivery date: 09/26/17     The patient will receive an FSI print out for each medication shipped and additional FDA Medication Guides as required.  Patient education from [employer] or Robet Leu may also be included in the shipment    Tamara Morris did not have any additional questions at this time.    Delivery address validated in FSI scheduling system: Yes, address listed in FSI is correct.    We will follow up with patient monthly for standard refill processing and delivery.      Thank you,  Tamara Morris   [facility] Specialty Pharmacist

## 2017-09-24 MED FILL — PROGRAF/1MG/CAP: PROGRAF/1MG/CAP | 30 days supply | Qty: 60 | Fill #2

## 2017-10-19 NOTE — Unmapped (Signed)
FOLLOW UP ANNUAL LIVER CLINIC NOTE     Patient Name: Tamara Morris  Medical Record Number: [id number]  Date of Service: 10/19/2017    Referring Physician: Stephens Shire   Current complaint: Follow up Annual Liver    Assessment/Plan:     Kearsten Ginther is a 77 y.o. 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  Recent testing/images stable, immunosuppressive levels were within goal range (~3).  Plan to continueTacrolimus 1mg  BID.    Large skin tag ~.5cm in diameter and smaller tag next to it removed from R upper chest wall. Patient has complained of this for many years as it is a nuisance. Utilized freeze spray for numbing and removed skin tag with 11 blade at thin stalk. Applied silver nitrate with good hemostasis. Dressed with pressure dressing. Encouraged patient to apply neosporine/antibiotic ointment to promote healing.     NSAID use, very sparingly - a few times a month at most for uncontrollable back pain. Recommended more hydration on days she uses these; look into a TENS unit for pain relief. Encouraged her to continue close follow up with local pain clinic.    HEALTH MAINTENANCE:   - Dermatology: This patient is at increased risk for the development of skin cancers due to immunosuppressant medications. We recommend yearly surveillance.  - Mammogram: annual   - Dental: annual  - Mental health: no issues  Immunization History   Administered Date(s) Administered   ??? SHINGRIX-ZOSTER VACCINE (HZV), RECOMBINANT,SUB-UNIT,ADJUVANTED IM 11/25/2016   ??? TdaP 01/27/2015     Singrix #1 today, will receive #2 with PCP   Will receive flu shot this fall, and will send [facility] records of pneumonia.    Return to clinic: 1 year  Labs: monthly    I spent a total of 40 minutes of which 50% was spent in counseling and coordination of care.    Subjective:     HPI: Tamara Morris is a 77 y.o. female who underwent liver transplant on 03/06/1993 for cryptogenic cirrhosis c/b graft nonfunction s/p second transplant 03/08/1993 who presents for annual evaluation. She has had minimal changes to health since previous visit.     She was diagnosed with moderate r carotid stenosis with less than 50% l carotid stenosis after assessed to have a carotid bruit. She is asymptomatic and on ASA and simvastatin with good control. Denies chest pain, SOB, N/V or abdominal pain. She states she has IBS and frequent stooling that does not cause her any problems. She denies acute complaint today, but does report persistent chronic back pain, largely unchanged.     No past medical history on file.    No past surgical history on file.    No family history on file.    Social History     Socioeconomic History   ??? Marital status: Divorced     Spouse name: Not on file   ??? Number of children: Not on file   ??? Years of education: Not on file   ??? Highest education level: Not on file   Occupational History   ??? Not on file   Social Needs   ??? Financial resource strain: Not on file   ??? Food insecurity:     Worry: Not on file     Inability: Not on file   ??? Transportation needs:     Medical: Not on file     Non-medical: Not on file   Tobacco  Use   ??? Smoking status: Former Smoker     Types: Cigarettes     Last attempt to quit: 11/02/1988     Years since quitting: 28.9   ??? Smokeless tobacco: Never Used   Substance and Sexual Activity   ??? Alcohol use: Not on file   ??? Drug use: Not on file   ??? Sexual activity: Not on file   Lifestyle   ??? Physical activity:     Days per week: Not on file     Minutes per session: Not on file   ??? Stress: Not on file   Relationships   ??? Social connections:     Talks on phone: Not on file     Gets together: Not on file     Attends religious service: Not on file     Active member of club or organization: Not on file     Attends meetings of clubs or organizations: Not on file     Relationship status: Not on file   Other Topics Concern   ??? Not on file Social History Narrative   ??? Not on file       REVIEW OF SYSTEMS:   The balance of 10/12 systems is negative with the exception of HPI.    Objective:     MEDICATIONS:  Allergies   Allergen Reactions   ??? Codeine Other (See Comments)   ??? Hydromorphone Other (See Comments)   ??? Meperidine Other (See Comments)       Current Outpatient Medications   Medication Sig Dispense Refill   ??? acetaminophen (TYLENOL) 325 MG tablet Take 500 mg by mouth every six (6) hours as needed for pain.      ??? hydrALAZINE (APRESOLINE) 25 MG tablet Take 25 mg by mouth Two (2) times a day.     ??? loperamide (IMODIUM A-D) 2 mg tablet Take 2 mg by mouth. Frequency:PRN   Dosage:2   MG  Instructions:  Note:Dose: 2MG      ??? LORazepam (ATIVAN) 0.5 MG tablet Take 0.5 mg by mouth. Frequency:PRN   Dosage:0.5   MG  Instructions:  Note:Dose: 0.5MG      ??? olmesartan (BENICAR) 20 MG tablet Take 20 mg by mouth daily.     ??? simvastatin (ZOCOR) 10 MG tablet Take 10 mg by mouth nightly.     ??? tacrolimus (PROGRAF) 1 MG capsule Take 1 capsule (1 mg total) by mouth two (2) times a day. 60 capsule 11   ??? verapamil (CALAN-SR) 240 MG CR tablet        No current facility-administered medications for this visit.          PHYSICAL EXAM:  BP 186/92  - Pulse 74  - Temp 36.4 ??C (97.5 ??F) (Tympanic)  - Ht 163.8 cm (5' 4.49)  - Wt 92.2 kg (203 lb 4.8 oz)  - BMI 34.37 kg/m??     General Appearance:  NAD, well appearing and well nourished.   HEENT:  [place]/AT. Well hydrated moist mucous membranes of the oral cavity. No scleral icterus. No cervical lymphadenopathy.   Pulmonary:    Normal respiratory effort. CTAB, without wheezes/crackles/rhonchi. Good air movement.    Cardiovascular:  Regular rate and rhythm, no murmur noted.   Extremities No edema.    Abdomen:   Normoactive bowel sounds, abdomen soft, non-tender and not distended, no Hepatosplenomegaly or masses. Abdominal scar well healed without hernia.    Musculoskeletal: No joint tenderness, full ROM. Normal gait.    Skin: Skin  color, texture, turgor normal, no rashes or lesions. Skin tag to R upper chest wall ~0.5cm diameter   Neurologic: Grossly intact.   Psychiatric: Judgement and insight appropriate.        LAB RESULTS:  All lab results last 24 hours:    Recent Results (from the past 48 hour(s))   Tacrolimus Level, Trough    Collection Time: 10/20/17  7:23 AM   Result Value Ref Range    Tacrolimus, Trough 3.1 (L) 5.0 - 15.0 ng/mL   Gamma GT    Collection Time: 10/20/17  7:23 AM   Result Value Ref Range    GGT 39 11 - 48 U/L   Magnesium Level    Collection Time: 10/20/17  7:23 AM   Result Value Ref Range    Magnesium 1.8 1.6 - 2.2 mg/dL   Phosphorus Level    Collection Time: 10/20/17  7:23 AM   Result Value Ref Range    Phosphorus 3.8 2.9 - 4.7 mg/dL   Bilirubin, Direct    Collection Time: 10/20/17  7:23 AM   Result Value Ref Range    Bilirubin, Direct 0.20 0.00 - 0.40 mg/dL   Comprehensive Metabolic Panel    Collection Time: 10/20/17  7:23 AM   Result Value Ref Range    Sodium 141 135 - 145 mmol/L    Potassium 5.1 (H) 3.5 - 5.0 mmol/L    Chloride 104 98 - 107 mmol/L    CO2 27.0 22.0 - 30.0 mmol/L    Anion Gap 10 9 - 15 mmol/L    BUN 28 (H) 7 - 21 mg/dL    Creatinine 1.61 (H) 0.60 - 1.00 mg/dL    BUN/Creatinine Ratio 25     EGFR CKD-EPI Non-African American, Female 49 (L) >=60 mL/min/1.41m2    EGFR CKD-EPI African American, Female 56 (L) >=60 mL/min/1.45m2    Glucose 98 65 - 179 mg/dL    Calcium 9.7 8.5 - 09.6 mg/dL    Albumin 4.2 3.5 - 5.0 g/dL    Total Protein 6.6 6.5 - 8.3 g/dL    Total Bilirubin 0.5 0.0 - 1.2 mg/dL    AST 30 14 - 38 U/L    ALT 39 15 - 48 U/L    Alkaline Phosphatase 55 38 - 126 U/L   CBC w/ Differential    Collection Time: 10/20/17  7:23 AM   Result Value Ref Range    WBC 9.2 4.5 - 11.0 10*9/L    RBC 4.17 4.00 - 5.20 10*12/L    HGB 12.7 12.0 - 16.0 g/dL    HCT 04.5 40.9 - 81.1 %    MCV 98.3 80.0 - 100.0 fL    MCH 30.5 26.0 - 34.0 pg    MCHC 31.0 31.0 - 37.0 g/dL    RDW 91.4 78.2 - 95.6 %    MPV 8.5 7.0 - 10.0 fL Platelet 273 150 - 440 10*9/L    Neutrophils % 62.0 %    Lymphocytes % 26.9 %    Monocytes % 5.8 %    Eosinophils % 3.0 %    Basophils % 0.7 %    Absolute Neutrophils 5.7 2.0 - 7.5 10*9/L    Absolute Lymphocytes 2.5 1.5 - 5.0 10*9/L    Absolute Monocytes 0.5 0.2 - 0.8 10*9/L    Absolute Eosinophils 0.3 0.0 - 0.4 10*9/L    Absolute Basophils 0.1 0.0 - 0.1 10*9/L    Large Unstained Cells 2 0 - 4 %    Macrocytosis Slight (  A) Not Present    Hypochromasia Slight (A) Not Present         IMAGING:  Xr Chest 2 Views  Result Date: 10/20/2017   Radiographically clear lungs. No pleural effusion or pneumothorax. Unremarkable cardiomediastinal silhouette.     US Liver Transplant  Result Date: 10/20/2017  - Patent hepatic transplant vasculature, with normal flow at the main portal vein anastomosis.   - Mildly elevated resistive indices in the hepatic transplant arteries, similar to prior.      _______________________________________________        Gertie Fey, DNP, APRN, FNP-C  [facility]  [address]  [place], [place]  [address]

## 2017-10-20 ENCOUNTER — Ambulatory Visit: Admit: 2017-10-20 | Discharge: 2017-10-20 | Payer: MEDICARE

## 2017-10-20 DIAGNOSIS — Z87891 Personal history of nicotine dependence: Secondary | ICD-10-CM | POA: Diagnosis not present

## 2017-10-20 DIAGNOSIS — Z944 Liver transplant status: Secondary | ICD-10-CM | POA: Diagnosis not present

## 2017-10-20 DIAGNOSIS — G8929 Other chronic pain: Secondary | ICD-10-CM | POA: Diagnosis not present

## 2017-10-20 DIAGNOSIS — Z4823 Encounter for aftercare following liver transplant: Secondary | ICD-10-CM | POA: Diagnosis not present

## 2017-10-20 DIAGNOSIS — I6521 Occlusion and stenosis of right carotid artery: Secondary | ICD-10-CM | POA: Diagnosis not present

## 2017-10-20 DIAGNOSIS — M25561 Pain in right knee: Secondary | ICD-10-CM | POA: Diagnosis not present

## 2017-10-20 DIAGNOSIS — L918 Other hypertrophic disorders of the skin: Secondary | ICD-10-CM | POA: Diagnosis not present

## 2017-10-20 DIAGNOSIS — D849 Immunodeficiency, unspecified: Secondary | ICD-10-CM | POA: Diagnosis not present

## 2017-10-20 DIAGNOSIS — M549 Dorsalgia, unspecified: Secondary | ICD-10-CM | POA: Diagnosis not present

## 2017-10-20 DIAGNOSIS — Z79899 Other long term (current) drug therapy: Secondary | ICD-10-CM | POA: Diagnosis not present

## 2017-10-20 DIAGNOSIS — M25562 Pain in left knee: Secondary | ICD-10-CM | POA: Diagnosis not present

## 2017-10-20 DIAGNOSIS — D899 Disorder involving the immune mechanism, unspecified: Secondary | ICD-10-CM

## 2017-10-20 DIAGNOSIS — Z5181 Encounter for therapeutic drug level monitoring: Secondary | ICD-10-CM

## 2017-10-20 LAB — EGFR CKD-EPI AA FEMALE: Lab: 56 — ABNORMAL LOW

## 2017-10-20 LAB — CBC W/ AUTO DIFF
BASOPHILS ABSOLUTE COUNT: 0.1 10*9/L (ref 0.0–0.1)
BASOPHILS RELATIVE PERCENT: 0.7 %
EOSINOPHILS ABSOLUTE COUNT: 0.3 10*9/L (ref 0.0–0.4)
EOSINOPHILS RELATIVE PERCENT: 3 %
HEMOGLOBIN: 12.7 g/dL (ref 12.0–16.0)
LARGE UNSTAINED CELLS: 2 % (ref 0–4)
LYMPHOCYTES ABSOLUTE COUNT: 2.5 10*9/L (ref 1.5–5.0)
LYMPHOCYTES RELATIVE PERCENT: 26.9 %
MEAN CORPUSCULAR HEMOGLOBIN CONC: 31 g/dL (ref 31.0–37.0)
MEAN CORPUSCULAR HEMOGLOBIN: 30.5 pg (ref 26.0–34.0)
MEAN CORPUSCULAR VOLUME: 98.3 fL (ref 80.0–100.0)
MEAN PLATELET VOLUME: 8.5 fL (ref 7.0–10.0)
MONOCYTES ABSOLUTE COUNT: 0.5 10*9/L (ref 0.2–0.8)
MONOCYTES RELATIVE PERCENT: 5.8 %
NEUTROPHILS RELATIVE PERCENT: 62 %
PLATELET COUNT: 273 10*9/L (ref 150–440)
RED BLOOD CELL COUNT: 4.17 10*12/L (ref 4.00–5.20)
RED CELL DISTRIBUTION WIDTH: 13.2 % (ref 12.0–15.0)
WBC ADJUSTED: 9.2 10*9/L (ref 4.5–11.0)

## 2017-10-20 LAB — COMPREHENSIVE METABOLIC PANEL
ALBUMIN: 4.2 g/dL (ref 3.5–5.0)
ALKALINE PHOSPHATASE: 55 U/L (ref 38–126)
ALT (SGPT): 39 U/L (ref 15–48)
ANION GAP: 10 mmol/L (ref 9–15)
AST (SGOT): 30 U/L (ref 14–38)
BILIRUBIN TOTAL: 0.5 mg/dL (ref 0.0–1.2)
BLOOD UREA NITROGEN: 28 mg/dL — ABNORMAL HIGH (ref 7–21)
BUN / CREAT RATIO: 25
CHLORIDE: 104 mmol/L (ref 98–107)
CO2: 27 mmol/L (ref 22.0–30.0)
CREATININE: 1.1 mg/dL — ABNORMAL HIGH (ref 0.60–1.00)
EGFR CKD-EPI AA FEMALE: 56 mL/min/{1.73_m2} — ABNORMAL LOW (ref >=60)
GLUCOSE RANDOM: 98 mg/dL (ref 65–179)
POTASSIUM: 5.1 mmol/L — ABNORMAL HIGH (ref 3.5–5.0)
PROTEIN TOTAL: 6.6 g/dL (ref 6.5–8.3)
SODIUM: 141 mmol/L (ref 135–145)

## 2017-10-20 LAB — TACROLIMUS, TROUGH: Lab: 3.1 — ABNORMAL LOW

## 2017-10-20 LAB — BILIRUBIN DIRECT: Bilirubin.glucuronidated:MCnc:Pt:Ser/Plas:Qn:: 0.2

## 2017-10-20 LAB — PHOSPHORUS: Phosphate:MCnc:Pt:Ser/Plas:Qn:: 3.8

## 2017-10-20 LAB — WBC ADJUSTED: Lab: 9.2

## 2017-10-20 LAB — MAGNESIUM: Magnesium:MCnc:Pt:Ser/Plas:Qn:: 1.8

## 2017-10-20 LAB — GAMMA GLUTAMYL TRANSFERASE: Gamma glutamyl transferase:CCnc:Pt:Ser/Plas:Qn:: 39

## 2017-10-20 NOTE — Unmapped (Signed)
Addended by: Abran Richard B on: 10/20/2017 03:01 PM     Modules accepted: Orders

## 2017-10-20 NOTE — Unmapped (Signed)
Pt was seen by Ermalinda Barrios FNP for her annual visit 24 yrs post liver transplant. Pt denies any n/v/d/f. She reports some loose stools at times related to her IBD. She has PRN imodium which she uses infrequently. Pt cont to struggle with chronic back pain. She expressed hesitation in visiting the pain clinic as her provider retired. Explained to her that she can call the practice and be assigned to a different provider. Pt sees her PCP regularly and visits dermatologist annually for cancer screening. Pt c/o bilateral knee pain and reported a local surgeon would not operate on her due to her transplant history. Informed pt referral would be submitted to [facility] and she will be called to schedule appt for evaluation. Pt is retired. She uses walking cane to ambulate. Annual CXR and Liver US results reviewed by Florene Glen, MD on 10/20/2017. No interventions recommended. Will repeat imaging in one year. Pt verbalized understanding of all information reviewed. Spend 30 minute son education.

## 2017-10-27 NOTE — Unmapped (Signed)
Patient is doing well  She has 7 days of prograf on hand at this time-she called in to set up her delivery     [facility] Refill Coordination Note    Specialty Medication(s) to be Shipped:   Transplant: Prograf 1mg     Other medication(s) to be shipped: n/a     Tamara Morris, DOB: 1940/07/20  Phone: [phone] (home)   Shipping Address: [address]  [place] [address]    All above HIPAA information was verified with patient.     Completed refill call assessment today to schedule patient's medication shipment from the [facility] [phone]).       Specialty medication(s) and dose(s) confirmed: Regimen is correct and unchanged.   Changes to medications: Tamara Morris reports no changes reported at this time.  Changes to insurance: No  Questions for the pharmacist: No    The patient will receive an FSI print out for each medication shipped and additional FDA Medication Guides as required.  Patient education from [place] or Robet Leu may also be included in the shipment.    DISEASE/MEDICATION-SPECIFIC INFORMATION        patient has 7 days of medication on hand    ADHERENCE     Medication Adherence    Patient reported X missed doses in the last month:  0  Specialty Medication:  prograf 1mg   Patient is on additional specialty medications:  No  Patient is on more than two specialty medications:  No  Any gaps in refill history greater than 2 weeks in the last 3 months:  no  Demonstrates understanding of importance of adherence:  yes  Informant:  patient  Reliability of informant:  reliable  Confirmed plan for next specialty medication refill:  delivery by pharmacy  Refills needed for supportive medications:  not needed          Refill Coordination    Has the Patients' Contact Information Changed:  No  Is the Shipping Address Different:  No         MEDICARE PART B DOCUMENTATION     Prograf 1mg : Patient has 14 capsules on hand.    SHIPPING     Shipping address confirmed in FSI. Delivery Scheduled: Yes, Expected medication delivery date: 7/30 via UPS or courier.     Tamara Morris   [facility] Specialty Technician

## 2017-10-30 MED FILL — PROGRAF/1MG/CAP: PROGRAF/1MG/CAP | 30 days supply | Qty: 60 | Fill #3

## 2017-11-04 NOTE — Unmapped (Signed)
Current Outpatient Medications on File Prior to Visit   Medication Sig   ??? acetaminophen (TYLENOL) 325 MG tablet Take 500 mg by mouth every six (6) hours as needed for pain.    ??? hydrALAZINE (APRESOLINE) 25 MG tablet Take 25 mg by mouth Two (2) times a day.   ??? loperamide (IMODIUM A-D) 2 mg tablet Take 2 mg by mouth. Frequency:PRN   Dosage:2   MG  Instructions:  Note:Dose: 2MG    ??? LORazepam (ATIVAN) 0.5 MG tablet Take 0.5 mg by mouth. Frequency:PRN   Dosage:0.5   MG  Instructions:  Note:Dose: 0.5MG    ??? olmesartan (BENICAR) 20 MG tablet Take 20 mg by mouth daily.   ??? simvastatin (ZOCOR) 10 MG tablet Take 10 mg by mouth nightly.   ??? tacrolimus (PROGRAF) 1 MG capsule Take 1 capsule (1 mg total) by mouth two (2) times a day.   ??? verapamil (CALAN-SR) 240 MG CR tablet      No current facility-administered medications on file prior to visit.

## 2017-11-23 NOTE — Unmapped (Signed)
[facility] Refill and Clinical Coordination Note  Medication(s): PROGRAF    Tamara Morris, DOB: 05-03-1940  Phone: [phone] (home) , Alternate phone contact: N/A  Shipping address: [address]  [place] [address]  Phone or address changes today?: No  All above HIPAA information verified.  Insurance changes? No    Completed refill and clinical call assessment today to schedule patient's medication shipment from the [facility] [phone]).      MEDICATION RECONCILIATION    Confirmed the medication and dosage are correct and have not changed: Yes, regimen is correct and unchanged.    Were there any changes to your medication(s) in the past month:  No, there are no changes reported at this time.    ADHERENCE    Is this medicine transplant or covered by Medicare Part B? Yes.    Prograf 1 mg   Quantity filled last month: 60   # of tablets left on hand: 10 DAYS LEFT    Did you miss any doses in the past 4 weeks? No missed doses reported.  Adherence counseling provided? Not needed     SIDE EFFECT MANAGEMENT    Are you tolerating your medication?:  Tamara Morris reports tolerating the medication.  Side effect management discussed: None      Therapy is appropriate and should be continued.    Evidence of clinical benefit: See Epic note from 10/20/17      FINANCIAL/SHIPPING    Delivery Scheduled: Yes, Expected medication delivery date: 11/28/17 VIA UPS     Additional medications refilled: No additional medications/refills needed at this time.    The patient will receive a drug information handout for each medication shipped and additional FDA Medication Guides as required.      Tamara Morris did not have any additional questions at this time.    Delivery address confirmed in Epic.     We will follow up with patient monthly for standard refill processing and delivery.      Thank you,  Thad Ranger   [facility] Specialty Pharmacist

## 2017-11-27 DIAGNOSIS — Z79899 Other long term (current) drug therapy: Secondary | ICD-10-CM | POA: Diagnosis not present

## 2017-11-27 DIAGNOSIS — Z944 Liver transplant status: Secondary | ICD-10-CM | POA: Diagnosis not present

## 2017-11-27 LAB — CBC W/ DIFFERENTIAL
BASOPHILS ABSOLUTE COUNT: 0 10*9/L
HEMATOCRIT: 41.3 %
HEMOGLOBIN: 13 g/dL
LYMPHOCYTES ABSOLUTE COUNT: 1.7 10*9/L
MONOCYTES ABSOLUTE COUNT: 0.6 10*9/L
NEUTROPHILS ABSOLUTE COUNT: 6.2 10*9/L
PLATELET COUNT: 244 10*9/L
WBC ADJUSTED: 8.7 10*9/L

## 2017-11-27 LAB — GAMMA GLUTAMYL TRANSFERASE
Lab: 0
Lab: 0

## 2017-11-27 LAB — PHOSPHORUS
Lab: 0
Lab: 3.3
PHOSPHORUS: 3.3 mg/dL

## 2017-11-27 LAB — COMPREHENSIVE METABOLIC PANEL
ALKALINE PHOSPHATASE: 74 U/L
ALT (SGPT): 25 U/L
AST (SGOT): 24 U/L
BILIRUBIN TOTAL: 0.4 mg/dL
BLOOD UREA NITROGEN: 27 mg/dL — ABNORMAL HIGH
CALCIUM: 9.2 mg/dL
CO2: 26.4 mmol/L
CREATININE: 1.01 mg/dL
GLUCOSE RANDOM: 111 mg/dL — ABNORMAL HIGH
POTASSIUM: 4.7 mmol/L
PROTEIN TOTAL: 6.6 g/dL

## 2017-11-27 LAB — LYMPHOCYTES RELATIVE PERCENT: Lab: 0

## 2017-11-27 LAB — ALBUMIN
ALBUMIN: 4.2 g/dL
Lab: 4.2

## 2017-11-27 LAB — MAGNESIUM
Lab: 0
Lab: 1.8

## 2017-11-27 LAB — NEUTROPHILS ABSOLUTE COUNT: Lab: 6.2

## 2017-11-27 LAB — CO2: Lab: 0

## 2017-11-27 LAB — BILIRUBIN DIRECT
Lab: 0
Lab: 0.1

## 2017-11-27 LAB — POTASSIUM: Lab: 4.7

## 2017-11-27 MED FILL — PROGRAF 1 MG CAPSULE: 30 days supply | Qty: 60 | Fill #0 | Status: AC

## 2017-11-27 MED FILL — PROGRAF 1 MG CAPSULE: ORAL | 30 days supply | Qty: 60 | Fill #0

## 2017-11-29 LAB — TACROLIMUS, TROUGH: Lab: 3.5 — AB

## 2017-12-15 ENCOUNTER — Other Ambulatory Visit (HOSPITAL_COMMUNITY): Payer: Self-pay | Admitting: Internal Medicine

## 2017-12-15 DIAGNOSIS — Z23 Encounter for immunization: Secondary | ICD-10-CM | POA: Diagnosis not present

## 2017-12-15 DIAGNOSIS — Z1231 Encounter for screening mammogram for malignant neoplasm of breast: Secondary | ICD-10-CM

## 2017-12-22 NOTE — Unmapped (Signed)
[facility] Refill Coordination Note    Specialty Medication(s) to be Shipped:   Transplant: Prograf 1mg      Tamara Morris, DOB: 1940/09/10  Phone: [phone] (home)   Shipping Address: [address]  [place] [address]    All above HIPAA information was verified with patient.     Completed refill call assessment today to schedule patient's medication shipment from the [facility] [phone]).       Specialty medication(s) and dose(s) confirmed: Regimen is correct and unchanged.   Changes to medications: Tamara Morris reports no changes reported at this time.  Changes to insurance: No  Questions for the pharmacist: No    The patient will receive a drug information handout for each medication shipped and additional FDA Medication Guides as required.      DISEASE/MEDICATION-SPECIFIC INFORMATION        N/A    ADHERENCE     Medication Adherence    Patient reported X missed doses in the last month:  0         MEDICARE PART B DOCUMENTATION     Prograf 1mg : Patient has 7 days worth of capsules on hand.    SHIPPING     Shipping address confirmed in Epic.     Delivery Scheduled: Yes, Expected medication delivery date: 12/27/2017 via UPS or courier.     Ericberto Padget Leodis Binet   [facility] Specialty Technician

## 2017-12-26 MED FILL — PROGRAF 1 MG CAPSULE: 30 days supply | Qty: 60 | Fill #1 | Status: AC

## 2017-12-26 MED FILL — PROGRAF 1 MG CAPSULE: ORAL | 30 days supply | Qty: 60 | Fill #1

## 2017-12-29 DIAGNOSIS — Z944 Liver transplant status: Secondary | ICD-10-CM | POA: Diagnosis not present

## 2017-12-29 DIAGNOSIS — Z79899 Other long term (current) drug therapy: Secondary | ICD-10-CM | POA: Diagnosis not present

## 2018-01-12 ENCOUNTER — Ambulatory Visit (HOSPITAL_COMMUNITY)
Admission: RE | Admit: 2018-01-12 | Discharge: 2018-01-12 | Disposition: A | Discharge status: Home / Self Care | Payer: Medicare Other | Source: Ambulatory Visit | Attending: Internal Medicine | Admitting: Internal Medicine

## 2018-01-12 DIAGNOSIS — Z1231 Encounter for screening mammogram for malignant neoplasm of breast: Secondary | ICD-10-CM | POA: Diagnosis not present

## 2018-01-15 NOTE — Unmapped (Signed)
[facility] Refill Coordination Note    Specialty Medication(s) to be Shipped:   Transplant: Prograf 1mg      Tamara Morris, DOB: Jul 10, 1940  Phone: [phone] (home)   Shipping Address: [address]  [place] [address]    All above HIPAA information was verified with patient.     Completed refill call assessment today to schedule patient's medication shipment from the [facility] [phone]).       Specialty medication(s) and dose(s) confirmed: Regimen is correct and unchanged.   Changes to medications: Novalynn reports no changes reported at this time.  Changes to insurance: No  Questions for the pharmacist: No    The patient will receive a drug information handout for each medication shipped and additional FDA Medication Guides as required.      DISEASE/MEDICATION-SPECIFIC INFORMATION        N/A    ADHERENCE     Medication Adherence    Patient reported X missed doses in the last month:  0  Specialty Medication:  Prograf 1mg   Patient is on additional specialty medications:  No  Patient is on more than two specialty medications:  No  Any gaps in refill history greater than 2 weeks in the last 3 months:  no  Demonstrates understanding of importance of adherence:  yes  Informant:  patient  Reliability of informant:  reliable      Adherence tools used:  patient uses a pill box to manage medications      Support network for adherence:  family member      Confirmed plan for next specialty medication refill:  delivery by pharmacy          Refill Coordination    Has the Patients' Contact Information Changed:  No  Is the Shipping Address Different:  No         MEDICARE PART B DOCUMENTATION     Prograf 1mg : Patient has 9 days worth of capsules on hand.    SHIPPING     Shipping address confirmed in Epic.     Delivery Scheduled: Yes, Expected medication delivery date: 01/23/2018 via UPS or courier.     Kyrin Garn Leodis Binet   [facility] Specialty Technician

## 2018-01-22 MED FILL — PROGRAF 1 MG CAPSULE: ORAL | 30 days supply | Qty: 60 | Fill #2

## 2018-01-22 MED FILL — PROGRAF 1 MG CAPSULE: 30 days supply | Qty: 60 | Fill #2 | Status: AC

## 2018-01-30 DIAGNOSIS — Z79899 Other long term (current) drug therapy: Secondary | ICD-10-CM | POA: Diagnosis not present

## 2018-01-30 DIAGNOSIS — Z944 Liver transplant status: Secondary | ICD-10-CM | POA: Diagnosis not present

## 2018-02-20 DIAGNOSIS — I10 Essential (primary) hypertension: Secondary | ICD-10-CM | POA: Diagnosis not present

## 2018-02-20 DIAGNOSIS — Z0000 Encounter for general adult medical examination without abnormal findings: Secondary | ICD-10-CM | POA: Diagnosis not present

## 2018-02-20 DIAGNOSIS — R7301 Impaired fasting glucose: Secondary | ICD-10-CM | POA: Diagnosis not present

## 2018-02-20 DIAGNOSIS — E782 Mixed hyperlipidemia: Secondary | ICD-10-CM | POA: Diagnosis not present

## 2018-02-21 ENCOUNTER — Ambulatory Visit (HOSPITAL_COMMUNITY)
Admission: RE | Admit: 2018-02-21 | Discharge: 2018-02-21 | Disposition: A | Discharge status: Home / Self Care | Payer: Medicare Other | Source: Ambulatory Visit | Attending: Vascular Surgery | Admitting: Vascular Surgery

## 2018-02-21 ENCOUNTER — Encounter: Payer: Self-pay | Admitting: Vascular Surgery

## 2018-02-21 ENCOUNTER — Ambulatory Visit (INDEPENDENT_AMBULATORY_CARE_PROVIDER_SITE_OTHER): Payer: Medicare Other | Admitting: Vascular Surgery

## 2018-02-21 ENCOUNTER — Other Ambulatory Visit: Payer: Self-pay

## 2018-02-21 VITALS — BP 168/82 | HR 87 | Temp 96.8°F | Resp 16 | Ht 64.5 in | Wt 207.0 lb

## 2018-02-21 DIAGNOSIS — I6523 Occlusion and stenosis of bilateral carotid arteries: Secondary | ICD-10-CM | POA: Diagnosis not present

## 2018-02-21 NOTE — Progress Notes (Signed)
Patient name: Linda Barr MRN: [id number] DOB: [birth year] Sex: female  REASON FOR VISIT:   Follow-up of carotid disease.  HPI:   Linda Barr is a pleasant 77 y.o. female who I saw in consultation on 08/30/2017.  The patient was found on routine physical exam in May to have a left carotid bruit.  This prompted a duplex scan which showed a 50 to 69% right carotid stenosis and a less than 50% left carotid stenosis.  The patient was asymptomatic.  She was on aspirin and was on a statin.  I recommended a follow-up duplex scan in 6 months.  She returns for that visit.  Since I saw her last, she denies any history of stroke, TIAs, expressive or receptive aphasia, or amaurosis fugax.  She is on aspirin and is on a statin.  Past Medical History:  Diagnosis Date  . Anxiety   . Arthritis   . Chronic back pain   . Essential hypertension    History of ventricular Bigemy, followed up with cardiologist ,   . Gout   . History of kidney stones   . Hx of liver transplant ([place])    1994, 1995 at [facility]  . Hyperlipidemia   . Kidney stones   . Palpitations     Family History  Problem Relation Age of Onset  . Stroke Mother   . Stroke Other     SOCIAL HISTORY: Social History   Tobacco Use  . Smoking status: Former Smoker    Packs/day: 1.00    Years: 12.00    Pack years: 12.00    Types: Cigarettes    Last attempt to quit: 04/29/1990    Years since quitting: 27.8  . Smokeless tobacco: Former Systems developer    Quit date: 04/04/1989  Substance Use Topics  . Alcohol use: No    Allergies  Allergen Reactions  . Codeine Other (See Comments)    Hallucinate, vomiting and sweating  . Demerol [Meperidine] Other (See Comments)    Hallucinations, vomiting and sweating.   . Dilaudid [Hydromorphone Hcl] Other (See Comments)    Hallucinations, sweating and vomiting  . Hydromorphone Other (See Comments)  . Morphine And Related Other (See Comments)    Hallucinations, vomiting and sweating    Current  Outpatient Medications  Medication Sig Dispense Refill  . acetaminophen (TYLENOL) 500 MG tablet Take 500 mg by mouth every 6 (six) hours as needed for mild pain or moderate pain.    Marland Kitchen aspirin 81 MG tablet Take 81 mg by mouth daily as needed (for palpitations).     . benzonatate (TESSALON) 100 MG capsule Take by mouth 3 (three) times daily as needed for cough.    . hydrALAZINE (APRESOLINE) 25 MG tablet Take 25 mg by mouth 2 (two) times daily.  1  . lisinopril (PRINIVIL,ZESTRIL) 20 MG tablet Take 20 mg by mouth daily.    Marland Kitchen olmesartan (BENICAR) 20 MG tablet Take 20 mg by mouth daily.  1  . pravastatin (PRAVACHOL) 40 MG tablet Take 40 mg by mouth at bedtime.    . simvastatin (ZOCOR) 10 MG tablet TAKE 1 TABLET BY MOUTH EVERY DAY AT NIGHT  0  . sodium chloride (OCEAN) 0.65 % SOLN nasal spray Place 1 spray into both nostrils every 4 (four) hours as needed for congestion.    . tacrolimus (PROGRAF) 1 MG capsule Take 1 mg by mouth 2 (two) times daily.    . verapamil (CALAN-SR) 240 MG CR tablet Take 240 mg by  mouth daily.     No current facility-administered medications for this visit.     REVIEW OF SYSTEMS:  [X]  denotes positive finding, [ ]  denotes negative finding Cardiac  Comments:  Chest pain or chest pressure:    Shortness of breath upon exertion:    Short of breath when lying flat:    Irregular heart rhythm:        Vascular    Pain in calf, thigh, or hip brought on by ambulation:    Pain in feet at night that wakes you up from your sleep:     Blood clot in your veins:    Leg swelling:         Pulmonary    Oxygen at home:    Productive cough:     Wheezing:         Neurologic    Sudden weakness in arms or legs:     Sudden numbness in arms or legs:     Sudden onset of difficulty speaking or slurred speech:    Temporary loss of vision in one eye:     Problems with dizziness:         Gastrointestinal    Blood in stool:     Vomited blood:         Genitourinary    Burning when  urinating:     Blood in urine:        Psychiatric    Major depression:         Hematologic    Bleeding problems:    Problems with blood clotting too easily:        Skin    Rashes or ulcers:        Constitutional    Fever or chills:     PHYSICAL EXAM:   Vitals:   02/21/18 1510 02/21/18 1512  BP: (!) 153/74 (!) 168/82  Pulse: 88 87  Resp: 16   Temp: (!) 96.8 F (36 C)   TempSrc: Oral   SpO2: 96%   Weight: 207 lb (93.9 kg)   Height: 5' 4.5" (1.638 m)     GENERAL: The patient is a well-nourished female, in no acute distress. The vital signs are documented above. CARDIAC: There is a regular rate and rhythm.  VASCULAR: She has a left carotid bruit. Both feet are warm and well-perfused. She has no significant lower extremity swelling. PULMONARY: There is good air exchange bilaterally without wheezing or rales. ABDOMEN: Soft and non-tender with normal pitched bowel sounds.  MUSCULOSKELETAL: There are no major deformities or cyanosis. NEUROLOGIC: No focal weakness or paresthesias are detected. SKIN: There are no ulcers or rashes noted. PSYCHIATRIC: The patient has a normal affect.  DATA:    CAROTID DUPLEX: I have independently interpreted her carotid duplex scan today.   On the right side there is a 40 to 59% carotid stenosis.  The velocities are stable compared to the study back in May.  The right vertebral artery is patent with antegrade flow.  On the left side there is a less than 39% carotid stenosis.  The left vertebral arteries patent with antegrade flow.  MEDICAL ISSUES:   ASYMPTOMATIC 40 TO 59% RIGHT CAROTID STENOSIS: Her right carotid stenosis is stable.  She is asymptomatic.  At this point I think it is safe to stretch her follow-up out to 1 year.  I have ordered a follow-up carotid duplex scan in 1 year and I will see her back at that time.  She is on  aspirin and is on a statin.  She understands we would not consider carotid endarterectomy unless the stenosis  progressed to greater than 80% or she develop new neurologic symptoms.  Deitra Mayo [facility] [phone]

## 2018-02-22 NOTE — Unmapped (Signed)
[facility] Pharm[facility] Refill Coordination Note  Medication: Prograf 1mg      Unable to reach patient to schedule shipment for medication being filled at [facility]. Left voicemail on phone.  As this is the 3rd unsuccessful attempt to reach the patient, no additional phone call attempts will be made at this time.      Phone numbers attempted: [phone] (H) & [phone] (M)    Last scheduled delivery: 01/22/2018    Please call the [facility] at [phone] (option 4) should you have any further questions.      Thanks,  [facility] Specialty Team

## 2018-02-26 DIAGNOSIS — K580 Irritable bowel syndrome with diarrhea: Secondary | ICD-10-CM | POA: Diagnosis not present

## 2018-02-26 DIAGNOSIS — E782 Mixed hyperlipidemia: Secondary | ICD-10-CM | POA: Diagnosis not present

## 2018-02-26 DIAGNOSIS — E875 Hyperkalemia: Secondary | ICD-10-CM | POA: Diagnosis not present

## 2018-02-26 DIAGNOSIS — F411 Generalized anxiety disorder: Secondary | ICD-10-CM | POA: Diagnosis not present

## 2018-02-26 DIAGNOSIS — Z944 Liver transplant status: Secondary | ICD-10-CM | POA: Diagnosis not present

## 2018-02-26 DIAGNOSIS — I129 Hypertensive chronic kidney disease with stage 1 through stage 4 chronic kidney disease, or unspecified chronic kidney disease: Secondary | ICD-10-CM | POA: Diagnosis not present

## 2018-02-26 DIAGNOSIS — R7301 Impaired fasting glucose: Secondary | ICD-10-CM | POA: Diagnosis not present

## 2018-02-26 DIAGNOSIS — N183 Chronic kidney disease, stage 3 (moderate): Secondary | ICD-10-CM | POA: Diagnosis not present

## 2018-02-26 NOTE — Unmapped (Signed)
[facility] Specialty Pharmacy Refill Coordination Note  Specialty Medication(s): Prograf 1mg       Tamara Morris, DOB: 31-Jan-1941  Phone: [phone] (home) , Alternate phone contact: N/A  Phone or address changes today?: No  All above HIPAA information was verified with patient.  Shipping Address: [address]  [place] [address]   Insurance changes? No    Completed refill call assessment today to schedule patient's medication shipment from the [facility] [phone]).      Confirmed the medication and dosage are correct and have not changed: Yes, regimen is correct and unchanged.    Confirmed patient started or stopped the following medications in the past month:  No, there are no changes reported at this time.    Are you tolerating your medication?:  Tamara Morris reports tolerating the medication.    ADHERENCE    (Below is required for Medicare Part B or Transplant patients only - per drug):   How many tablets were dispensed last month: 60  Patient currently has 12 remaining.    Did you miss any doses in the past 4 weeks? No missed doses reported.    FINANCIAL/SHIPPING    Delivery Scheduled: Yes, Expected medication delivery date: 02/28/2018     Medication will be delivered via UPS to the home address in [facility].    The patient will receive a drug information handout for each medication shipped and additional FDA Medication Guides as required.      Tamara Morris did not have any additional questions at this time.    We will follow up with patient monthly for standard refill processing and delivery.      Thank you,  Tamara Morris  Anders Grant   [facility] Specialty Pharmacist

## 2018-02-27 MED FILL — PROGRAF 1 MG CAPSULE: ORAL | 30 days supply | Qty: 60 | Fill #3

## 2018-02-27 MED FILL — PROGRAF 1 MG CAPSULE: 30 days supply | Qty: 60 | Fill #3 | Status: AC

## 2018-03-06 DIAGNOSIS — Z944 Liver transplant status: Secondary | ICD-10-CM | POA: Diagnosis not present

## 2018-03-06 DIAGNOSIS — Z79899 Other long term (current) drug therapy: Secondary | ICD-10-CM | POA: Diagnosis not present

## 2018-03-06 LAB — CBC W/ DIFFERENTIAL
BASOPHILS ABSOLUTE COUNT: 0.1 10*9/L
EOSINOPHILS ABSOLUTE COUNT: 0.3 10*9/L
HEMATOCRIT: 10.1 %
HEMOGLOBIN: 12.5 g/dL
LYMPHOCYTES ABSOLUTE COUNT: 2.4 10*9/L
MONOCYTES ABSOLUTE COUNT: 0.8 10*9/L
NEUTROPHILS ABSOLUTE COUNT: 5.1 10*9/L
PLATELET COUNT: 234 10*9/L

## 2018-03-06 LAB — COMPREHENSIVE METABOLIC PANEL
ALKALINE PHOSPHATASE: 71 U/L
ALT (SGPT): 24 U/L
AST (SGOT): 24 U/L
BILIRUBIN TOTAL: 0.4 mg/dL
BLOOD UREA NITROGEN: 25 mg/dL — ABNORMAL HIGH
CALCIUM: 8.9 mg/dL
CO2: 24.7 mmol/L
CREATININE: 0.93 mg/dL
GLUCOSE RANDOM: 96 mg/dL
POTASSIUM: 4.8 mmol/L
SODIUM: 140 mmol/L

## 2018-03-06 LAB — PHOSPHORUS: Lab: 3.5

## 2018-03-06 LAB — GAMMA GLUTAMYL TRANSFERASE: Lab: 37

## 2018-03-06 LAB — CHLORIDE: Lab: 105

## 2018-03-06 LAB — ALBUMIN: Lab: 4.4

## 2018-03-06 LAB — BILIRUBIN DIRECT: Lab: 0.1

## 2018-03-06 LAB — BILIRUBIN, DIRECT: BILIRUBIN DIRECT: 0.1 mg/dL

## 2018-03-06 LAB — LYMPHOCYTES ABSOLUTE COUNT: Lab: 2.4

## 2018-03-06 LAB — MAGNESIUM: Lab: 1.8

## 2018-03-08 LAB — TACROLIMUS, TROUGH: Lab: 3.7 — AB

## 2018-03-21 NOTE — Unmapped (Signed)
[facility] Specialty Pharmacy Refill Coordination Note  Specialty Medication(s): Prograf 1mg  capsules  Additional Medications shipped: none    [name], DOB: 1940/10/21  Phone: [phone] (home) , Alternate phone contact: N/A  Phone or address changes today?: No  All above HIPAA information was verified with patient.  Shipping Address: [address]  [place] [address]   Insurance changes? No    Completed refill call assessment today to schedule patient's medication shipment from the [facility] [phone]).      Confirmed the medication and dosage are correct and have not changed: Yes, regimen is correct and unchanged.    Confirmed patient started or stopped the following medications in the past month:  No, there are no changes reported at this time.    Are you tolerating your medication?:  Annibelle reports tolerating the medication.    ADHERENCE    Is this medicine transplant or covered by Medicare Part B? Yes.    Patient has 17 capsules on hand of Prograf    Did you miss any doses in the past 4 weeks? No missed doses reported.    FINANCIAL/SHIPPING    Delivery Scheduled: Yes, Expected medication delivery date: 03/23/18     Medication will be delivered via UPS to the home address in [facility].    The patient will receive a drug information handout for each medication shipped and additional FDA Medication Guides as required.     [name] did not have any additional questions at this time.    Delivery address confirmed in Epic.    We will follow up with patient monthly for standard refill processing and delivery.      Thank you,  Mervyn Gay   [facility] Specialty Pharmacist

## 2018-03-22 NOTE — Unmapped (Signed)
Tamara Morris 's PROGRAF shipment will be delayed due to Refill too soon until 12/20 We have contacted the patient and communicated the delivery change to patient/caregiver We will reschedule the medication for the delivery date that the patient agreed upon. We have confirmed the delivery date as 12/23 .

## 2018-03-23 MED FILL — PROGRAF 1 MG CAPSULE: ORAL | 30 days supply | Qty: 60 | Fill #4

## 2018-03-23 MED FILL — PROGRAF 1 MG CAPSULE: 30 days supply | Qty: 60 | Fill #4 | Status: AC

## 2018-04-02 ENCOUNTER — Encounter (HOSPITAL_COMMUNITY): Payer: Self-pay | Admitting: Emergency Medicine

## 2018-04-02 ENCOUNTER — Emergency Department (HOSPITAL_COMMUNITY): Payer: Medicare Other

## 2018-04-02 ENCOUNTER — Emergency Department (HOSPITAL_COMMUNITY)
Admission: EM | Admit: 2018-04-02 | Discharge: 2018-04-02 | Disposition: A | Discharge status: Home / Self Care | Payer: Medicare Other | Attending: Emergency Medicine | Admitting: Emergency Medicine

## 2018-04-02 ENCOUNTER — Other Ambulatory Visit: Payer: Self-pay

## 2018-04-02 DIAGNOSIS — R000 Tachycardia, unspecified: Secondary | ICD-10-CM | POA: Diagnosis not present

## 2018-04-02 DIAGNOSIS — Z87891 Personal history of nicotine dependence: Secondary | ICD-10-CM | POA: Diagnosis not present

## 2018-04-02 DIAGNOSIS — Z7982 Long term (current) use of aspirin: Secondary | ICD-10-CM | POA: Diagnosis not present

## 2018-04-02 DIAGNOSIS — Z79899 Other long term (current) drug therapy: Secondary | ICD-10-CM | POA: Diagnosis not present

## 2018-04-02 DIAGNOSIS — R05 Cough: Secondary | ICD-10-CM | POA: Diagnosis not present

## 2018-04-02 DIAGNOSIS — I10 Essential (primary) hypertension: Secondary | ICD-10-CM | POA: Insufficient documentation

## 2018-04-02 DIAGNOSIS — J40 Bronchitis, not specified as acute or chronic: Secondary | ICD-10-CM

## 2018-04-02 LAB — CBC
HEMATOCRIT: 38.8 % (ref 36.0–46.0)
HEMOGLOBIN: 11.9 g/dL — AB (ref 12.0–15.0)
MCH: 30.3 pg (ref 26.0–34.0)
MCHC: 30.7 g/dL (ref 30.0–36.0)
MCV: 98.7 fL (ref 80.0–100.0)
Platelets: 245 10*3/uL (ref 150–400)
RBC: 3.93 MIL/uL (ref 3.87–5.11)
RDW: 13.2 % (ref 11.5–15.5)
WBC: 10.8 10*3/uL — ABNORMAL HIGH (ref 4.0–10.5)
nRBC: 0 % (ref 0.0–0.2)

## 2018-04-02 LAB — BASIC METABOLIC PANEL
Anion gap: 8 (ref 5–15)
BUN: 32 mg/dL — ABNORMAL HIGH (ref 8–23)
CALCIUM: 8.8 mg/dL — AB (ref 8.9–10.3)
CHLORIDE: 108 mmol/L (ref 98–111)
CO2: 23 mmol/L (ref 22–32)
Creatinine, Ser: 1.07 mg/dL — ABNORMAL HIGH (ref 0.44–1.00)
GFR calc Af Amer: 58 mL/min — ABNORMAL LOW (ref >60)
GFR calc non Af Amer: 50 mL/min — ABNORMAL LOW (ref >60)
GLUCOSE: 159 mg/dL — AB (ref 70–99)
POTASSIUM: 4.7 mmol/L (ref 3.5–5.1)
Sodium: 139 mmol/L (ref 135–145)

## 2018-04-02 LAB — TROPONIN I: Troponin I: <0.03 ng/mL (ref <0.03)

## 2018-04-02 MED ORDER — DOXYCYCLINE HYCLATE 100 MG PO CAPS: 100.0000 mg | ORAL_CAPSULE | Freq: Two times a day (BID) | ORAL | 0 refills | Status: DC

## 2018-04-02 NOTE — ED Notes (Signed)
Patient transported to X-ray 

## 2018-04-02 NOTE — Discharge Instructions (Signed)
Take the medications as prescribed, follow-up with your doctor next week to be rechecked

## 2018-04-02 NOTE — ED Triage Notes (Signed)
PT c/o cough x2 weeks. PT states this morning when she was coughing she started vomiting and having dizziness and her b/p was reading high at home.

## 2018-04-02 NOTE — ED Provider Notes (Signed)
[facility] EMERGENCY DEPARTMENT Provider Note   CSN: [id number] Arrival date & time: 04/02/18  2423     History   Chief Complaint Chief Complaint  Patient presents with  . Emesis    HPI Linda Barr is a 77 y.o. female.  HPI Patient presented to the emergency room for evaluation of cough that started a couple weeks ago.  Patient initially thought it was just her allergies.  She has not been bringing up a lot of sputum.  This morning she had another coughing spell.  She then vomited after the cough.  She was feeling lightheaded and dizzy so she checked her blood pressure and it was elevated.  Patient came into the emergency room for evaluation.  She denies any fevers.  No chest pain.  No diarrhea or abdominal pain.  No difficulty with her speech, or vision.  No focal numbness or weakness Past Medical History:  Diagnosis Date  . Anxiety   . Arthritis   . Chronic back pain   . Essential hypertension    History of ventricular Bigemy, followed up with cardiologist ,   . Gout   . History of kidney stones   . Hx of liver transplant ([place])    1994, 1995 at [facility]  . Hyperlipidemia   . Kidney stones   . Palpitations     Patient Active Problem List   Diagnosis Date Noted  . Ventricular bigeminy 10/22/2014  . Atypical chest pain 10/22/2014  . Chest pain 04/21/2014  . HTN (hypertension) 04/21/2014    Past Surgical History:  Procedure Laterality Date  . ABDOMINAL HYSTERECTOMY    . APPENDECTOMY    . BACK SURGERY     x3; plates and screws  . CATARACT EXTRACTION W/PHACO Left 06/27/2016   Procedure: CATARACT EXTRACTION PHACO AND INTRAOCULAR LENS PLACEMENT (IOC);  Surgeon: Tonny Branch, MD;  Location: [facility];  Service: Ophthalmology;  Laterality: Left;  CDE: 7.94  . CATARACT EXTRACTION W/PHACO Right 07/28/2016   Procedure: CATARACT EXTRACTION PHACO AND INTRAOCULAR LENS PLACEMENT RIGHT EYE CDE=6.33;  Surgeon: Tonny Branch, MD;  Location: [facility];  Service: Ophthalmology;  Laterality:  Right;  right  . LIVER TRANSPLANT       OB History    Gravida  6   Para  5   Term  5   Preterm      AB  1   Living  5     SAB  1   TAB      Ectopic      Multiple      Live Births               Home Medications    Prior to Admission medications   Medication Sig Start Date End Date Taking? Authorizing Provider  acetaminophen (TYLENOL) 500 MG tablet Take 500 mg by mouth every 6 (six) hours as needed for mild pain or moderate pain.    [provider]  aspirin 81 MG tablet Take 81 mg by mouth daily as needed (for palpitations).     [provider]  benzonatate (TESSALON) 100 MG capsule Take by mouth 3 (three) times daily as needed for cough.    [provider]  doxycycline (VIBRAMYCIN) 100 MG capsule Take 1 capsule (100 mg total) by mouth 2 (two) times daily. 04/02/18   Dorie Rank, MD  hydrALAZINE (APRESOLINE) 25 MG tablet Take 25 mg by mouth 2 (two) times daily. 08/07/17   [provider]  lisinopril (PRINIVIL,ZESTRIL) 20  MG tablet Take 20 mg by mouth daily.    [provider]  olmesartan (BENICAR) 20 MG tablet Take 20 mg by mouth daily. 08/22/17   [provider]  pravastatin (PRAVACHOL) 40 MG tablet Take 40 mg by mouth at bedtime.    [provider]  simvastatin (ZOCOR) 10 MG tablet TAKE 1 TABLET BY MOUTH EVERY DAY AT NIGHT 06/13/17   [provider]  sodium chloride (OCEAN) 0.65 % SOLN nasal spray Place 1 spray into both nostrils every 4 (four) hours as needed for congestion.    [provider]  tacrolimus (PROGRAF) 1 MG capsule Take 1 mg by mouth 2 (two) times daily.    [provider]  verapamil (CALAN-SR) 240 MG CR tablet Take 240 mg by mouth daily.    [provider]    Family History Family History  Problem Relation Age of Onset  . Stroke Mother   . Stroke Other     Social History Social History   Tobacco Use  . Smoking status: Former Smoker    Packs/day:  1.00    Years: 12.00    Pack years: 12.00    Types: Cigarettes    Last attempt to quit: 04/29/1990    Years since quitting: 27.9  . Smokeless tobacco: Former Systems developer    Quit date: 04/04/1989  Substance Use Topics  . Alcohol use: No  . Drug use: No     Allergies   Codeine; Demerol [meperidine]; Dilaudid [hydromorphone hcl]; Hydromorphone; and Morphine and related   Review of Systems Review of Systems  All other systems reviewed and are negative.    Physical Exam Updated Vital Signs BP 112/60   Pulse 80   Temp 98.9 F (37.2 C) (Oral)   Resp (!) 21   Ht 1.626 m (5\' 4" )   Wt 92.5 kg   SpO2 93%   BMI 35.02 kg/m   Physical Exam Vitals signs and nursing note reviewed.  Constitutional:      General: She is not in acute distress.    Appearance: She is well-developed.  HENT:     Head: Normocephalic and atraumatic.     Right Ear: External ear normal.     Left Ear: External ear normal.  Eyes:     General: No scleral icterus.       Right eye: No discharge.        Left eye: No discharge.     Conjunctiva/sclera: Conjunctivae normal.  Neck:     Musculoskeletal: Neck supple.     Trachea: No tracheal deviation.  Cardiovascular:     Rate and Rhythm: Normal rate and regular rhythm.  Pulmonary:     Effort: Pulmonary effort is normal. No respiratory distress.     Breath sounds: Normal breath sounds. No stridor. No wheezing or rales.  Abdominal:     General: Bowel sounds are normal. There is no distension.     Palpations: Abdomen is soft.     Tenderness: There is no abdominal tenderness. There is no guarding or rebound.  Musculoskeletal:        General: No tenderness.  Skin:    General: Skin is warm and dry.     Findings: No rash.  Neurological:     Mental Status: She is alert and oriented to person, place, and time.     Cranial Nerves: No cranial nerve deficit (no facial droop, extraocular movements intact, no slurred speech).     Sensory: No sensory deficit.  Motor: No  abnormal muscle tone or seizure activity.     Coordination: Coordination normal.     Comments: No pronator drift bilateral upper extrem, able to hold both legs off bed for 5 seconds, sensation intact in all extremities, no visual field cuts, no left or right sided neglect, normal finger-nose exam bilaterally, no nystagmus noted       ED Treatments / Results  Labs (all labs ordered are listed, but only abnormal results are displayed) Labs Reviewed  BASIC METABOLIC PANEL - Abnormal; Notable for the following components:      Result Value   Glucose, Bld 159 (*)    BUN 32 (*)    Creatinine, Ser 1.07 (*)    Calcium 8.8 (*)    GFR calc non Af Amer 50 (*)    GFR calc Af Amer 58 (*)    All other components within normal limits  CBC - Abnormal; Notable for the following components:   WBC 10.8 (*)    Hemoglobin 11.9 (*)    All other components within normal limits  TROPONIN I    EKG EKG Interpretation  Date/Time:  Monday April 02 2018 08:55:32 EST Ventricular Rate:  105 PR Interval:    QRS Duration: 99 QT Interval:  349 QTC Calculation: 462 R Axis:   23 Text Interpretation:  Sinus tachycardia Multiple ventricular premature complexes Low voltage, precordial leads Abnormal R-wave progression, early transition No significant change since last tracing Confirmed by Dorie Rank [phone]) on 04/02/2018 9:03:25 AM   Radiology Dg Chest 2 View  Result Date: 04/02/2018 CLINICAL DATA:  Cough for 2 weeks EXAM: CHEST - 2 VIEW COMPARISON:  X 03/30/2015 FINDINGS: Low volume chest with interstitial coarsening at the bases. There is a opacity over the lower spine in the lateral projection that is not clearly localized on the frontal view. Cardiomegaly accentuated by low volumes. No Kerley lines, effusion, or pneumothorax. Artifact from EKG leads IMPRESSION: Interstitial coarsening, favored bronchitic. Pneumonia is not excluded based on the lateral view, suggest follow-up radiograph. Electronically  Signed   By: Monte Fantasia M.D.   On: 04/02/2018 10:09    Procedures Procedures (including critical care time)  Medications Ordered in ED Medications - No data to display   Initial Impression / Assessment and Plan / ED Course  I have reviewed the triage vital signs and the nursing notes.  Pertinent labs & imaging results that were available during my care of the patient were reviewed by me and considered in my medical decision making (see chart for details).   Patient's laboratory tests are unremarkable.  Chest x-ray showed bronchitis changes with the possibility of pneumonia.  Patient's physical exam was reassuring.  She had no focal deficits.  She is afebrile and nontoxic.  Patient took 1 of her blood pressure medications before she arrived.  She was not given any additional medications and her blood pressure has improved.  I suspect her hypertension was partly related to the stress of the situation and her discomfort.  Chest x-ray does show the possibility of early pneumonia.  She has been coughing.  I will give her a prescription for doxycycline.  Discussed outpatient follow-up with her primary care doctor.  Final Clinical Impressions(s) / ED Diagnoses   Final diagnoses:  Bronchitis    ED Discharge Orders         Ordered    doxycycline (VIBRAMYCIN) 100 MG capsule  2 times daily     04/02/18 1140  Dorie Rank, MD 04/02/18 1146

## 2018-04-05 NOTE — Unmapped (Signed)
Received vm from pt requesting a return call. Called pt and she explained she has missed some of her lab work because she has been occupied with her son who fell at work and got paralyzed. Pt also reported having bronchitis and is currently taking Doxycyline 100 mg BID for 10 days. She will get labs towards the end of the month. She reports being followed closely by her PCP. Provided emotional support and encouragement to pt and wished her well.

## 2018-04-13 DIAGNOSIS — H9319 Tinnitus, unspecified ear: Secondary | ICD-10-CM | POA: Diagnosis not present

## 2018-04-13 DIAGNOSIS — H6121 Impacted cerumen, right ear: Secondary | ICD-10-CM | POA: Diagnosis not present

## 2018-04-13 DIAGNOSIS — Z944 Liver transplant status: Secondary | ICD-10-CM | POA: Diagnosis not present

## 2018-04-13 DIAGNOSIS — I10 Essential (primary) hypertension: Secondary | ICD-10-CM | POA: Diagnosis not present

## 2018-04-13 DIAGNOSIS — J302 Other seasonal allergic rhinitis: Secondary | ICD-10-CM | POA: Diagnosis not present

## 2018-04-16 ENCOUNTER — Other Ambulatory Visit (HOSPITAL_COMMUNITY): Payer: Self-pay | Admitting: Internal Medicine

## 2018-04-16 ENCOUNTER — Ambulatory Visit (HOSPITAL_COMMUNITY)
Admission: RE | Admit: 2018-04-16 | Discharge: 2018-04-16 | Disposition: A | Discharge status: Home / Self Care | Payer: Medicare Other | Source: Ambulatory Visit | Attending: Internal Medicine | Admitting: Internal Medicine

## 2018-04-16 DIAGNOSIS — R05 Cough: Secondary | ICD-10-CM

## 2018-04-16 DIAGNOSIS — R059 Cough, unspecified: Secondary | ICD-10-CM

## 2018-04-17 NOTE — Unmapped (Signed)
Disregard

## 2018-04-17 NOTE — Unmapped (Signed)
[facility] Pharmacy Refill and Clinical Coordination Note  Medication(s): Prograf 1mg     Tamara Morris, DOB: 24-Mar-1941  Phone: [phone] (home) , Alternate phone contact: N/A  Shipping address: [address]  [place] [address]  Phone or address changes today?: No  All above HIPAA information verified.  Insurance changes? No    Completed refill and clinical call assessment today to schedule patient's medication shipment from the [facility] [phone]).      MEDICATION RECONCILIATION    Confirmed the medication and dosage are correct and have not changed: Yes, regimen is correct and unchanged.    Were there any changes to your medication(s) in the past month:  No, there are no changes reported at this time.    ADHERENCE    Is this medicine transplant or covered by Medicare Part B? Yes.    Prograf 1mg : Patient has 20 capsules on hand.    Did you miss any doses in the past 4 weeks? No missed doses reported.  Adherence counseling provided? Not needed     SIDE EFFECT MANAGEMENT    Are you tolerating your medication?:  Tamara Morris reports tolerating the medication.  Side effect management discussed: None      Therapy is appropriate and should be continued.    Evidence of clinical benefit: See Epic note from 10/20/17      FINANCIAL/SHIPPING    Delivery Scheduled: Yes, Expected medication delivery date: 04/24/18     Medication will be delivered via UPS to the home address in [facility].    Additional medications refilled: No additional medications/refills needed at this time.    The patient will receive a drug information handout for each medication shipped and additional FDA Medication Guides as required.      Tamara Morris did not have any additional questions at this time.    Delivery address confirmed in Epic.     We will follow up with patient monthly for standard refill processing and delivery.      Thank you,  Breck Coons [facility] Specialty Pharmacist

## 2018-04-25 MED FILL — PROGRAF 1 MG CAPSULE: ORAL | 30 days supply | Qty: 60 | Fill #5

## 2018-04-25 MED FILL — PROGRAF 1 MG CAPSULE: 30 days supply | Qty: 60 | Fill #5 | Status: AC

## 2018-04-25 NOTE — Unmapped (Signed)
Tamara Morris 's PROGRAF shipment will be delayed due to Cost Increase We have contacted the patient and left a message We will wait for a call back from the patient/caregiver to reschedule the delivery.  We have confirmed the delivery date as N/A .

## 2018-04-26 NOTE — Unmapped (Signed)
Called pt and left vm informing pt that [facility] has been trying to get in touch with her regarding Prograf shipment without success. Asked her to call them back.   Called her daughter's phone and left vm with same request.

## 2018-05-04 DIAGNOSIS — Z79899 Other long term (current) drug therapy: Secondary | ICD-10-CM | POA: Diagnosis not present

## 2018-05-04 DIAGNOSIS — Z944 Liver transplant status: Secondary | ICD-10-CM | POA: Diagnosis not present

## 2018-05-04 LAB — COMPREHENSIVE METABOLIC PANEL
ALKALINE PHOSPHATASE: 75 U/L
ALT (SGPT): 25 U/L
AST (SGOT): 22 U/L
BILIRUBIN TOTAL: 0.5 mg/dL
CHLORIDE: 102 mmol/L
CO2: 22.8 mmol/L
CREATININE: 1.13 mg/dL
EGFR MDRD AF AMER: 56 mL/min/{1.73_m2} — ABNORMAL LOW
EGFR MDRD NON AF AMER: 47 mL/min/{1.73_m2} — ABNORMAL LOW
GLUCOSE RANDOM: 116 mg/dL — ABNORMAL HIGH
POTASSIUM: 4.5 mmol/L
PROTEIN TOTAL: 6.9 g/dL
SODIUM: 139 mmol/L

## 2018-05-04 LAB — CBC W/ DIFFERENTIAL
BASOPHILS ABSOLUTE COUNT: 0.1 10*9/L
EOSINOPHILS ABSOLUTE COUNT: 0.1 10*9/L
HEMATOCRIT: 40.9 %
HEMOGLOBIN: 13.1 g/dL
LYMPHOCYTES ABSOLUTE COUNT: 2.4 10*9/L
MONOCYTES ABSOLUTE COUNT: 0.9 10*9/L
NEUTROPHILS ABSOLUTE COUNT: 7.6 10*9/L
PLATELET COUNT: 273 10*9/L
WHITE BLOOD CELL COUNT: 11 10*9/L — ABNORMAL HIGH

## 2018-05-04 LAB — PHOSPHORUS: Lab: 3.8

## 2018-05-04 LAB — MAGNESIUM: Lab: 1.9

## 2018-05-04 LAB — ALBUMIN: Lab: 4.6

## 2018-05-04 LAB — CHLORIDE: Lab: 102

## 2018-05-04 LAB — BILIRUBIN DIRECT: Lab: 0.1

## 2018-05-04 LAB — GAMMA GLUTAMYL TRANSFERASE: Lab: 48

## 2018-05-04 LAB — EOSINOPHILS ABSOLUTE COUNT: Lab: 0.1

## 2018-05-08 LAB — TACROLIMUS, TROUGH: Lab: 4

## 2018-05-09 NOTE — Unmapped (Signed)
Called pt regarding slightly elevated WBC. Left vm asking pt if she has any signs of infection and if she does advised her to see her PCP.

## 2018-05-14 NOTE — Unmapped (Signed)
Patient does not need a refill of specialty medication at this time. Moving specialty refill reminder call to appropriate date

## 2018-05-14 NOTE — Unmapped (Signed)
Received return call from pt reporting that she has no signs of infection(1/31 WBC was slightly elevated). She reported that she got a CXR after ABX therapy for bronchitis and it was unremarkable. Will cont monitoring labs.

## 2018-05-18 NOTE — Unmapped (Signed)
[facility] Refill Coordination Note    Specialty Medication(s) to be Shipped:   Transplant: Prograf 1mg     Other medication(s) to be shipped: none     Tamara Morris, DOB: February 13, 1941  Phone: [phone] (home)       All above HIPAA information was verified with patient.     Completed refill call assessment today to schedule patient's medication shipment from the [facility] [phone]).       Specialty medication(s) and dose(s) confirmed: Regimen is correct and unchanged.   Changes to medications: Tamara Morris reports no changes reported at this time.  Changes to insurance: No  Questions for the pharmacist: No    The patient will receive a drug information handout for each medication shipped and additional FDA Medication Guides as required.      DISEASE/MEDICATION-SPECIFIC INFORMATION        N/A    ADHERENCE     Medication Adherence    Adherence tools used:  patient uses a pill box to manage medications  Support network for adherence:  family member              MEDICARE PART B DOCUMENTATION     Prograf 1mg : Patient has 7 days worth of capsules on hand.    SHIPPING     Shipping address confirmed in Epic.     Delivery Scheduled: Yes, Expected medication delivery date: 05/22/18 via UPS or courier.     Medication will be delivered via UPS to the home address in [place].    Arnold Long   [facility] Specialty Pharmacist

## 2018-05-21 MED FILL — PROGRAF 1 MG CAPSULE: ORAL | 30 days supply | Qty: 60 | Fill #6

## 2018-05-21 MED FILL — PROGRAF 1 MG CAPSULE: 30 days supply | Qty: 60 | Fill #6 | Status: AC

## 2018-05-29 DIAGNOSIS — J060 Acute laryngopharyngitis: Secondary | ICD-10-CM | POA: Diagnosis not present

## 2018-06-08 DIAGNOSIS — Z944 Liver transplant status: Secondary | ICD-10-CM | POA: Diagnosis not present

## 2018-06-08 DIAGNOSIS — Z79899 Other long term (current) drug therapy: Secondary | ICD-10-CM | POA: Diagnosis not present

## 2018-06-08 DIAGNOSIS — Z5181 Encounter for therapeutic drug level monitoring: Principal | ICD-10-CM

## 2018-06-08 LAB — CBC W/ DIFFERENTIAL
BASOPHILS ABSOLUTE COUNT: 0 10*9/L
EOSINOPHILS ABSOLUTE COUNT: 0.3 10*9/L
HEMATOCRIT: 40.9 %
HEMOGLOBIN: 12.6 g/dL
HEMOGLOBIN: 12.6 g/dL
LYMPHOCYTES ABSOLUTE COUNT: 2.3 10*9/L
MONOCYTES ABSOLUTE COUNT: 0.8 10*9/L
NEUTROPHILS ABSOLUTE COUNT: 6.2 10*9/L
PLATELET COUNT: 252 10*9/L

## 2018-06-08 LAB — COMPREHENSIVE METABOLIC PANEL
ALKALINE PHOSPHATASE: 72 U/L
ALT (SGPT): 21 U/L
BILIRUBIN TOTAL: 0.5 mg/dL
BLOOD UREA NITROGEN: 25 mg/dL — ABNORMAL HIGH
CALCIUM: 9.3 mg/dL
CHLORIDE: 104 mmol/L
CO2: 24.6 mmol/L
CREATININE: 1.07 mg/dL
CREATININE: 1.07 mg/dL — ABNORMAL HIGH
GLUCOSE RANDOM: 117 mg/dL — ABNORMAL HIGH
SODIUM: 141 mmol/L

## 2018-06-08 NOTE — Unmapped (Signed)
Received call from pt reporting that she got labs today. Called her back with lab results that are WNL. Tac level still pending. Pt appreciated call.

## 2018-06-18 NOTE — Unmapped (Signed)
[facility] Refill Coordination Note    Specialty Medication(s) to be Shipped:   Transplant: Prograf 1mg     Other medication(s) to be shipped: N/A     Tamara Morris, DOB: 06-22-1940  Phone: [phone] (home) [phone] (work)      All above HIPAA information was verified with patient.     Completed refill call assessment today to schedule patient's medication shipment from the [facility] [phone]).       Specialty medication(s) and dose(s) confirmed: Regimen is correct and unchanged.   Changes to medications: Tamara Morris reports no changes reported at this time.  Changes to insurance: No  Questions for the pharmacist: No    Confirmed patient received Welcome Packet with first shipment. The patient will receive a drug information handout for each medication shipped and additional FDA Medication Guides as required.       DISEASE/MEDICATION-SPECIFIC INFORMATION        N/A    SPECIALTY MEDICATION ADHERENCE     Medication Adherence    Patient reported X missed doses in the last month:  0  Specialty Medication:  PROGRAF 1MG   Patient is on additional specialty medications:  No  Adherence tools used:  patient uses a pill box to manage medications  Support network for adherence:  family member                PROGRAF 1 mg: 8 days of medicine on hand       SHIPPING     Shipping address confirmed in Epic.     Delivery Scheduled: Yes, Expected medication delivery date: 3/18.     Medication will be delivered via UPS to the home address in [place].    Tamara Morris   [facility] Specialty Technician

## 2018-06-19 DIAGNOSIS — Z944 Liver transplant status: Principal | ICD-10-CM

## 2018-06-19 MED ORDER — PROGRAF 1 MG CAPSULE
ORAL_CAPSULE | ORAL | 11 refills | 0 days | Status: CP
Start: 2018-06-19 — End: 2019-06-19
  Filled 2018-06-20: qty 60, 30d supply, fill #0

## 2018-06-19 NOTE — Unmapped (Signed)
Ms. Tamara Morris called requesting 90 day prescription.  She reports having at least 5 days of medication currently.  Her Shipment will be delayed due to getting a new 90 rx from the provider.  Delivery date has been rescheduled for 06/21/18.

## 2018-06-20 MED FILL — PROGRAF 1 MG CAPSULE: 30 days supply | Qty: 60 | Fill #0 | Status: AC

## 2018-07-11 NOTE — Unmapped (Signed)
[facility] Refill Coordination Note    Specialty Medication(s) to be Shipped:   Transplant: Prograf 1mg      Tamara Morris, DOB: May 06, 1940  Phone: [phone] (home) [phone] (work)    All above HIPAA information was verified with patient.     Completed refill call assessment today to schedule patient's medication shipment from the [facility] [phone]).       Specialty medication(s) and dose(s) confirmed: Regimen is correct and unchanged.   Changes to medications: Tamara Morris reports no changes reported at this time.  Changes to insurance: No  Questions for the pharmacist: No    Confirmed patient received Welcome Packet with first shipment. The patient will receive a drug information handout for each medication shipped and additional FDA Medication Guides as required.       DISEASE/MEDICATION-SPECIFIC INFORMATION        N/A    SPECIALTY MEDICATION ADHERENCE     Medication Adherence    Patient reported X missed doses in the last month:  0  Specialty Medication:  Prograf 1mg    Patient is on additional specialty medications:  No  Patient is on more than two specialty medications:  No  Any gaps in refill history greater than 2 weeks in the last 3 months:  no  Adherence tools used:  patient uses a pill box to manage medications  Support network for adherence:  family member        Prograf 1 mg: 10 days of medicine on hand     SHIPPING     Shipping address confirmed in Epic.     Delivery Scheduled: Yes, Expected medication delivery date: 07/20/2018.     Medication will be delivered via UPS to the home address in [place].    Mikolaj Woolstenhulme P Allena Katz   [facility] Specialty Technician

## 2018-07-19 MED FILL — PROGRAF 1 MG CAPSULE: ORAL | 30 days supply | Qty: 60 | Fill #1

## 2018-07-19 MED FILL — PROGRAF 1 MG CAPSULE: 30 days supply | Qty: 60 | Fill #1 | Status: AC

## 2018-07-20 DIAGNOSIS — Z944 Liver transplant status: Secondary | ICD-10-CM | POA: Diagnosis not present

## 2018-07-20 DIAGNOSIS — Z79899 Other long term (current) drug therapy: Secondary | ICD-10-CM | POA: Diagnosis not present

## 2018-07-20 LAB — CBC W/ DIFFERENTIAL
HEMATOCRIT: 40.7 %
HEMOGLOBIN: 12.7 g/dL
LYMPHOCYTES ABSOLUTE COUNT: 2.2 10*9/L
MONOCYTES ABSOLUTE COUNT: 0.7 10*9/L
NEUTROPHILS ABSOLUTE COUNT: 4.7 10*9/L
PLATELET COUNT: 270 10*9/L
RED BLOOD CELL COUNT: 12.7 g/dL
WBC ADJUSTED: 8.1 10*9/L

## 2018-07-20 LAB — COMPREHENSIVE METABOLIC PANEL
ALKALINE PHOSPHATASE: 75 U/L
ALT (SGPT): 22 U/L
AST (SGOT): 23 U/L
AST (SGOT): 23 U/L — ABNORMAL HIGH
BILIRUBIN TOTAL: 0.4 mg/dL
CALCIUM: 9 mg/dL
CHLORIDE: 102 mmol/L
CREATININE: 1.16 mg/dL
EGFR MDRD AF AMER: 55 mL/min/{1.73_m2} — ABNORMAL LOW
EGFR MDRD NON AF AMER: 45 mL/min/{1.73_m2} — ABNORMAL LOW
GLUCOSE RANDOM: 115 mg/dL — ABNORMAL HIGH
POTASSIUM: 4.7 mmol/L
SODIUM: 139 mmol/L

## 2018-08-16 NOTE — Unmapped (Signed)
[facility] Pharm[facility] Clinical Assessment & Refill Coordination Note    Tamara Morris, DOB: 08/22/1940  Phone: [phone] (home) [phone] (work)    All above HIPAA information was verified with patient.     Specialty Medication(s):   Transplant: Prograf 1mg      Current Outpatient Medications   Medication Sig Dispense Refill   ??? acetaminophen (TYLENOL) 325 MG tablet Take 500 mg by mouth every six (6) hours as needed for pain.      ??? hydrALAZINE (APRESOLINE) 25 MG tablet Take 25 mg by mouth Two (2) times a day.     ??? loperamide (IMODIUM A-D) 2 mg tablet Take 2 mg by mouth. Frequency:PRN   Dosage:2   MG  Instructions:  Note:Dose: 2MG      ??? LORazepam (ATIVAN) 0.5 MG tablet Take 0.5 mg by mouth. Frequency:PRN   Dosage:0.5   MG  Instructions:  Note:Dose: 0.5MG      ??? olmesartan (BENICAR) 20 MG tablet Take 20 mg by mouth daily.     ??? PROGRAF 1 mg capsule TAKE 1 CAPSULE BY MOUTH TWICE DAILY 60 capsule 11   ??? simvastatin (ZOCOR) 10 MG tablet Take 10 mg by mouth nightly.     ??? verapamil (CALAN-SR) 240 MG CR tablet        No current facility-administered medications for this visit.         Changes to medications: Tamara Morris reports no changes at this time.    Allergies   Allergen Reactions   ??? Codeine Other (See Comments)   ??? Hydromorphone Other (See Comments)   ??? Meperidine Other (See Comments)       Changes to allergies: No    SPECIALTY MEDICATION ADHERENCE     Prograf 1 mg: 7 days of medicine on hand     Medication Adherence    Patient reported X missed doses in the last month:  0  Specialty Medication:  Prograf 1mg   Patient is on additional specialty medications:  No  Adherence tools used:  patient uses a pill box to manage medications  Support network for adherence:  family member          Specialty medication(s) dose(s) confirmed: Regimen is correct and unchanged.     Are there any concerns with adherence? No    Adherence counseling provided? Not needed    CLINICAL MANAGEMENT AND INTERVENTION Clinical Benefit Assessment:    Do you feel the medicine is effective or helping your condition? Yes    Clinical Benefit counseling provided? Not needed    Adverse Effects Assessment:    Are you experiencing any side effects? No    Are you experiencing difficulty administering your medicine? No    Quality of Life Assessment:    How many days over the past month did your liver transplant  keep you from your normal activities? For example, brushing your teeth or getting up in the morning. 0    Have you discussed this with your provider? Not needed    Therapy Appropriateness:    Is therapy appropriate? Yes, therapy is appropriate and should be continued    DISEASE/MEDICATION-SPECIFIC INFORMATION      N/A    PATIENT SPECIFIC NEEDS     ? Does the patient have any physical, cognitive, or cultural barriers? No    ? Is the patient high risk? No     ? Does the patient require a Care Management Plan? No     ? Does the patient require physician  intervention or other additional services (i.e. nutrition, smoking cessation, social work)? No      SHIPPING     Specialty Medication(s) to be Shipped:   Transplant: Prograf 1mg     Other medication(s) to be shipped: none     Changes to insurance: No    Delivery Scheduled: Yes, Expected medication delivery date: 08/21/2018.     Medication will be delivered via UPS to the confirmed home address in [place] [facility].    The patient will receive a drug information handout for each medication shipped and additional FDA Medication Guides as required.  Verified that patient has previously received a Conservation officer, historic buildings.    Tamara Morris   [facility] Specialty Pharmacist

## 2018-08-20 MED FILL — PROGRAF 1 MG CAPSULE: 30 days supply | Qty: 60 | Fill #2 | Status: AC

## 2018-08-20 MED FILL — PROGRAF 1 MG CAPSULE: ORAL | 30 days supply | Qty: 60 | Fill #2

## 2018-08-21 NOTE — Unmapped (Signed)
Received vm from pt requesting lab order. Asked tpa to fax new standing lab order with tac level. Called pt and left vm informing her lab order would be faxed today. Reminded her to wear mask when she goes to the hospital and to practice social distancing.

## 2018-08-21 NOTE — Unmapped (Signed)
Duplicate encounter

## 2018-08-21 NOTE — Unmapped (Signed)
Faxed thru Epic updated standing lab order to [facility].

## 2018-08-23 NOTE — Unmapped (Addendum)
Called pt to let her know this TPA faxed updated standing order to [facility] 2 days ago. Pt said this lab is now owned by [facility] but she now goes inside [facility] to get labs done.PT shared fax number and this TPA reminded her what to do if ever an issue at lab. Pt said her son was paralyzed in work accident last August and that was reason she was unable to attend liver tx reunion last October. Support was given. Discussed pt's annual appt in July and decided to make that appt on a different phone call. She verbalized understanding of all discussed.    Called [facility] lab and confirmed fax number.    Updated order faxed thru epic and confirmed on this end.    Updated lab info in Epic.

## 2018-08-28 DIAGNOSIS — N183 Chronic kidney disease, stage 3 (moderate): Secondary | ICD-10-CM | POA: Diagnosis not present

## 2018-08-28 DIAGNOSIS — E782 Mixed hyperlipidemia: Secondary | ICD-10-CM | POA: Diagnosis not present

## 2018-08-28 DIAGNOSIS — R7301 Impaired fasting glucose: Secondary | ICD-10-CM | POA: Diagnosis not present

## 2018-08-28 DIAGNOSIS — I10 Essential (primary) hypertension: Secondary | ICD-10-CM | POA: Diagnosis not present

## 2018-08-31 DIAGNOSIS — Z79899 Other long term (current) drug therapy: Secondary | ICD-10-CM | POA: Diagnosis not present

## 2018-08-31 DIAGNOSIS — Z944 Liver transplant status: Secondary | ICD-10-CM | POA: Diagnosis not present

## 2018-09-07 DIAGNOSIS — I129 Hypertensive chronic kidney disease with stage 1 through stage 4 chronic kidney disease, or unspecified chronic kidney disease: Secondary | ICD-10-CM | POA: Diagnosis not present

## 2018-09-07 DIAGNOSIS — R7301 Impaired fasting glucose: Secondary | ICD-10-CM | POA: Diagnosis not present

## 2018-09-07 DIAGNOSIS — E782 Mixed hyperlipidemia: Secondary | ICD-10-CM | POA: Diagnosis not present

## 2018-09-07 DIAGNOSIS — R7303 Prediabetes: Secondary | ICD-10-CM | POA: Diagnosis not present

## 2018-09-07 DIAGNOSIS — F411 Generalized anxiety disorder: Secondary | ICD-10-CM | POA: Diagnosis not present

## 2018-09-07 DIAGNOSIS — Z944 Liver transplant status: Secondary | ICD-10-CM | POA: Diagnosis not present

## 2018-09-07 DIAGNOSIS — E875 Hyperkalemia: Secondary | ICD-10-CM | POA: Diagnosis not present

## 2018-09-07 DIAGNOSIS — N183 Chronic kidney disease, stage 3 (moderate): Secondary | ICD-10-CM | POA: Diagnosis not present

## 2018-09-07 DIAGNOSIS — K580 Irritable bowel syndrome with diarrhea: Secondary | ICD-10-CM | POA: Diagnosis not present

## 2018-09-18 NOTE — Unmapped (Signed)
[facility] Special[facility] Pharmacy Refill Coordination Note    Specialty Medication(s) to be Shipped:   Transplant: Prograf 1mg     Other medication(s) to be shipped: n/a     Tamara Morris, DOB: Aug 08, 1940  Phone: [phone] (home) [phone] (work)      All above HIPAA information was verified with patient.     Completed refill call assessment today to schedule patient's medication shipment from the [facility] [phone]).       Specialty medication(s) and dose(s) confirmed: Regimen is correct and unchanged.   Changes to medications: Dilynn reports no changes at this time.  Changes to insurance: No  Questions for the pharmacist: No    Confirmed patient received Welcome Packet with first shipment. The patient will receive a drug information handout for each medication shipped and additional FDA Medication Guides as required.       DISEASE/MEDICATION-SPECIFIC INFORMATION        N/A    SPECIALTY MEDICATION ADHERENCE     Medication Adherence    Patient reported X missed doses in the last month:  0  Specialty Medication:  Prograf 1 mg  Patient is on additional specialty medications:  No  Adherence tools used:  patient uses a pill box to manage medications  Support network for adherence:  family member                Prograf 1 mg: 7 days of medicine on hand         SHIPPING     Shipping address confirmed in Epic.     Delivery Scheduled: Yes, Expected medication delivery date: 09/21/18.     Medication will be delivered via UPS to the home address in [place].    [name]   [facility] Specialty Technician

## 2018-09-20 MED FILL — PROGRAF 1 MG CAPSULE: 30 days supply | Qty: 60 | Fill #3 | Status: AC

## 2018-09-20 MED FILL — PROGRAF 1 MG CAPSULE: ORAL | 30 days supply | Qty: 60 | Fill #3

## 2018-09-24 DIAGNOSIS — F419 Anxiety disorder, unspecified: Secondary | ICD-10-CM | POA: Diagnosis not present

## 2018-09-24 DIAGNOSIS — K580 Irritable bowel syndrome with diarrhea: Secondary | ICD-10-CM | POA: Diagnosis not present

## 2018-09-24 DIAGNOSIS — Z6834 Body mass index (BMI) 34.0-34.9, adult: Secondary | ICD-10-CM | POA: Diagnosis not present

## 2018-09-24 DIAGNOSIS — Z6833 Body mass index (BMI) 33.0-33.9, adult: Secondary | ICD-10-CM | POA: Diagnosis not present

## 2018-09-24 DIAGNOSIS — J060 Acute laryngopharyngitis: Secondary | ICD-10-CM | POA: Diagnosis not present

## 2018-09-24 DIAGNOSIS — I10 Essential (primary) hypertension: Secondary | ICD-10-CM | POA: Diagnosis not present

## 2018-09-24 DIAGNOSIS — E782 Mixed hyperlipidemia: Secondary | ICD-10-CM | POA: Diagnosis not present

## 2018-09-24 DIAGNOSIS — Z944 Liver transplant status: Secondary | ICD-10-CM | POA: Diagnosis not present

## 2018-09-24 DIAGNOSIS — N183 Chronic kidney disease, stage 3 (moderate): Secondary | ICD-10-CM | POA: Diagnosis not present

## 2018-09-24 DIAGNOSIS — R7303 Prediabetes: Secondary | ICD-10-CM | POA: Diagnosis not present

## 2018-09-24 DIAGNOSIS — R05 Cough: Secondary | ICD-10-CM | POA: Diagnosis not present

## 2018-09-24 DIAGNOSIS — J302 Other seasonal allergic rhinitis: Secondary | ICD-10-CM | POA: Diagnosis not present

## 2018-10-11 DIAGNOSIS — Z944 Liver transplant status: Secondary | ICD-10-CM | POA: Diagnosis not present

## 2018-10-11 DIAGNOSIS — Z79899 Other long term (current) drug therapy: Secondary | ICD-10-CM | POA: Diagnosis not present

## 2018-10-11 LAB — COMPREHENSIVE METABOLIC PANEL
ALT (SGPT): 27 U/L
AST (SGOT): 24 U/L
BILIRUBIN TOTAL: 0.4 mg/dL
BLOOD UREA NITROGEN: 20 mg/dL — ABNORMAL HIGH
CALCIUM: 9.4 mg/dL
CHLORIDE: 105 mmol/L
CO2: 25.2 mmol/L
CREATININE: 1.12 mg/dL
EGFR CKD-EPI AA FEMALE: 57 mL/min/{1.73_m2} — ABNORMAL LOW
EGFR CKD-EPI NON-AA FEMALE: 47 mL/min/{1.73_m2} — ABNORMAL LOW
GLUCOSE RANDOM: 107 mg/dL — ABNORMAL HIGH
POTASSIUM: 4.5 mmol/L
PROTEIN TOTAL: 6.6 g/dL
SODIUM: 139 mmol/L

## 2018-10-11 LAB — BILIRUBIN DIRECT: Lab: 0.1

## 2018-10-11 LAB — PHOSPHORUS: Lab: 3.5

## 2018-10-11 LAB — CBC W/ DIFFERENTIAL
BASOPHILS ABSOLUTE COUNT: 0.1 10*9/L
EOSINOPHILS ABSOLUTE COUNT: 0.3 10*9/L
HEMOGLOBIN: 12.2 g/dL
LYMPHOCYTES ABSOLUTE COUNT: 2.4 10*9/L
NEUTROPHILS ABSOLUTE COUNT: 5 10*9/L
PLATELET COUNT: 245 10*9/L
WHITE BLOOD CELL COUNT: 8.4 10*9/L

## 2018-10-11 LAB — MAGNESIUM
Lab: 0
Lab: 1.8

## 2018-10-11 LAB — EOSINOPHILS RELATIVE PERCENT: Lab: 0

## 2018-10-11 LAB — ALBUMIN: Lab: 4.5

## 2018-10-11 LAB — GAMMA GLUTAMYL TRANSFERASE: Lab: 39

## 2018-10-11 LAB — CHLORIDE: Lab: 105

## 2018-10-16 LAB — TACROLIMUS, TROUGH: Lab: 3.9

## 2018-10-16 NOTE — Unmapped (Signed)
Called [facility] lab for pt's 7/9 tac which was faxed and entered.    Called pt's cell phone and vm was full so called home phone and left vm wishing her a Happy Birthday.

## 2018-10-17 NOTE — Unmapped (Signed)
[facility] Pharmacy Refill Coordination Note    Specialty Medication(s) to be Shipped:   Transplant: Prograf 1mg     Other medication(s) to be shipped: na     Tamara Morris, DOB: Nov 29, 1940  Phone: [phone] (home) [phone] (work)      All above HIPAA information was verified with patient.     Completed refill call assessment today to schedule patient's medication shipment from the [facility] [phone]).       Specialty medication(s) and dose(s) confirmed: Regimen is correct and unchanged.   Changes to medications: Jacquelene reports no changes at this time.  Changes to insurance: No  Questions for the pharmacist: No    Confirmed patient received Welcome Packet with first shipment. The patient will receive a drug information handout for each medication shipped and additional FDA Medication Guides as required.       DISEASE/MEDICATION-SPECIFIC INFORMATION        N/A    SPECIALTY MEDICATION ADHERENCE     Medication Adherence    Patient reported X missed doses in the last month: 0  Specialty Medication: Prograf 1mg    Patient is on additional specialty medications: No  Patient is on more than two specialty medications: No  Any gaps in refill history greater than 2 weeks in the last 3 months: no  Demonstrates understanding of importance of adherence: yes  Informant: patient  Reliability of informant: reliable  Adherence tools used: patient uses a pill box to manage medications  Support network for adherence: family member  Confirmed plan for next specialty medication refill: delivery by pharmacy  Refills needed for supportive medications: not needed                Prograf 1mg  . 7 days supply remaining      SHIPPING     Shipping address confirmed in Epic.     Delivery Scheduled: Yes, Expected medication delivery date: 072020.     Medication will be delivered via UPS to the home address in [place].    Aliz Meritt D Sergi Gellner   [facility] Shared [facility] Specialty Technician

## 2018-10-19 MED FILL — PROGRAF 1 MG CAPSULE: 30 days supply | Qty: 60 | Fill #4 | Status: AC

## 2018-10-19 MED FILL — PROGRAF 1 MG CAPSULE: ORAL | 30 days supply | Qty: 60 | Fill #4

## 2018-10-24 NOTE — Unmapped (Signed)
Called pt to schedule annual appt. Left vm asking for call back. Left call-back number.

## 2018-11-05 ENCOUNTER — Other Ambulatory Visit: Payer: Self-pay

## 2018-11-07 NOTE — Unmapped (Signed)
[name] Pharm[facility] Clinical Assessment & Refill Coordination Note    Tamara Morris, DOB: [birth year]  Phone: [phone] (home) [phone] (work)    All above HIPAA information was verified with patient.     Specialty Medication(s):   Transplant: Prograf 1mg      Current Outpatient Medications   Medication Sig Dispense Refill   ??? acetaminophen (TYLENOL) 325 MG tablet Take 500 mg by mouth every six (6) hours as needed for pain.      ??? hydrALAZINE (APRESOLINE) 25 MG tablet Take 25 mg by mouth Two (2) times a day.     ??? loperamide (IMODIUM A-D) 2 mg tablet Take 2 mg by mouth. Frequency:PRN   Dosage:2   MG  Instructions:  Note:Dose: 2MG      ??? LORazepam (ATIVAN) 0.5 MG tablet Take 0.5 mg by mouth. Frequency:PRN   Dosage:0.5   MG  Instructions:  Note:Dose: 0.5MG      ??? olmesartan (BENICAR) 20 MG tablet Take 20 mg by mouth daily.     ??? PROGRAF 1 mg capsule TAKE 1 CAPSULE BY MOUTH TWICE DAILY 60 capsule 11   ??? simvastatin (ZOCOR) 10 MG tablet Take 10 mg by mouth nightly.     ??? verapamil (CALAN-SR) 240 MG CR tablet        No current facility-administered medications for this visit.         Changes to medications: Tamara Morris reports no changes at this time.    Allergies   Allergen Reactions   ??? Codeine Other (See Comments)   ??? Hydromorphone Other (See Comments)   ??? Meperidine Other (See Comments)       Changes to allergies: No    SPECIALTY MEDICATION ADHERENCE     Prograf 1 mg: 10 days of medicine on hand     Medication Adherence    Patient reported X missed doses in the last month: 0  Specialty Medication: prograf 1mg   Patient is on additional specialty medications: No  Adherence tools used: patient uses a pill box to manage medications  Support network for adherence: family member          Specialty medication(s) dose(s) confirmed: Regimen is correct and unchanged.     Are there any concerns with adherence? No    Adherence counseling provided? Not needed    CLINICAL MANAGEMENT AND INTERVENTION Clinical Benefit Assessment:    Do you feel the medicine is effective or helping your condition? Yes    Clinical Benefit counseling provided? Not needed    Adverse Effects Assessment:    Are you experiencing any side effects? No    Are you experiencing difficulty administering your medicine? No    Quality of Life Assessment:    How many days over the past month did your liver transplant  keep you from your normal activities? For example, brushing your teeth or getting up in the morning. 0    Have you discussed this with your provider? Not needed    Therapy Appropriateness:    Is therapy appropriate? Yes, therapy is appropriate and should be continued    DISEASE/MEDICATION-SPECIFIC INFORMATION      N/A    PATIENT SPECIFIC NEEDS     ? Does the patient have any physical, cognitive, or cultural barriers? No    ? Is the patient high risk? No     ? Does the patient require a Care Management Plan? No     ? Does the patient require physician intervention or other additional services (  i.e. nutrition, smoking cessation, social work)? No      SHIPPING     Specialty Medication(s) to be Shipped:   Transplant: Prograf 1mg     Other medication(s) to be shipped: none     Changes to insurance: No    Delivery Scheduled: Yes, Expected medication delivery date: 11/14/2018.     Medication will be delivered via UPS to the confirmed home address in [facility].    The patient will receive a drug information handout for each medication shipped and additional FDA Medication Guides as required.  Verified that patient has previously received a Conservation officer, historic buildings.    All of the patient's questions and concerns have been addressed.    Tamara Morris   [facility] Specialty Pharmacist

## 2018-11-13 MED FILL — PROGRAF 1 MG CAPSULE: 30 days supply | Qty: 60 | Fill #5 | Status: AC

## 2018-11-13 MED FILL — PROGRAF 1 MG CAPSULE: ORAL | 30 days supply | Qty: 60 | Fill #5

## 2018-11-16 DIAGNOSIS — Z79899 Other long term (current) drug therapy: Secondary | ICD-10-CM | POA: Diagnosis not present

## 2018-11-16 DIAGNOSIS — Z944 Liver transplant status: Secondary | ICD-10-CM | POA: Diagnosis not present

## 2018-11-16 LAB — CBC W/ DIFFERENTIAL
BASOPHILS ABSOLUTE COUNT: 0.1 10*9/L
EOSINOPHILS ABSOLUTE COUNT: 0.3 10*9/L
HEMATOCRIT: 40.1 %
HEMOGLOBIN: 12.3 g/dL
LYMPHOCYTES ABSOLUTE COUNT: 2 10*9/L
NEUTROPHILS ABSOLUTE COUNT: 5.6 10*9/L
PLATELET COUNT: 247 10*9/L
WHITE BLOOD CELL COUNT: 8.7 10*9/L

## 2018-11-16 LAB — COMPREHENSIVE METABOLIC PANEL
ALKALINE PHOSPHATASE: 76 U/L
ALT (SGPT): 25 U/L
AST (SGOT): 24 U/L
BILIRUBIN TOTAL: 0.4 mg/dL
CALCIUM: 9.5 mg/dL
CHLORIDE: 103 mmol/L
CO2: 26.3 mmol/L
CREATININE: 1.09 mg/dL
GLUCOSE RANDOM: 115 mg/dL — ABNORMAL HIGH
POTASSIUM: 4.4 mmol/L
PROTEIN TOTAL: 6.8 g/dL
SODIUM: 140 mmol/L

## 2018-11-16 LAB — EGFR CKD-EPI AA MALE: Lab: 0

## 2018-11-16 LAB — ALBUMIN: Lab: 4.3

## 2018-11-16 LAB — NUCLEATED RED BLOOD CELLS: Lab: 0

## 2018-11-16 LAB — GAMMA GLUTAMYL TRANSFERASE: Lab: 42

## 2018-11-16 LAB — BILIRUBIN DIRECT: Lab: 0.1

## 2018-11-16 LAB — MAGNESIUM: Lab: 1.8

## 2018-11-16 LAB — PHOSPHORUS: Lab: 3.5

## 2018-11-19 LAB — TACROLIMUS, TROUGH: Lab: 2.9

## 2018-11-19 LAB — TACROLIMUS LEVEL, TROUGH: TACROLIMUS, TROUGH: 2.9 ng/mL

## 2018-12-12 ENCOUNTER — Other Ambulatory Visit (HOSPITAL_COMMUNITY): Payer: Self-pay | Admitting: Internal Medicine

## 2018-12-12 DIAGNOSIS — Z1231 Encounter for screening mammogram for malignant neoplasm of breast: Secondary | ICD-10-CM

## 2018-12-12 NOTE — Unmapped (Signed)
[facility] Shared [facility] Clinical Assessment & Refill Coordination Note    Tamara Morris, DOB: 01-08-1941  Phone: [phone] (home) [phone] (work)    All above HIPAA information was verified with patient.     Specialty Medication(s):   Transplant: Prograf 1mg      Current Outpatient Medications   Medication Sig Dispense Refill   ??? acetaminophen (TYLENOL) 325 MG tablet Take 500 mg by mouth every six (6) hours as needed for pain.      ??? hydrALAZINE (APRESOLINE) 25 MG tablet Take 25 mg by mouth Two (2) times a day.     ??? loperamide (IMODIUM A-D) 2 mg tablet Take 2 mg by mouth. Frequency:PRN   Dosage:2   MG  Instructions:  Note:Dose: 2MG      ??? LORazepam (ATIVAN) 0.5 MG tablet Take 0.5 mg by mouth. Frequency:PRN   Dosage:0.5   MG  Instructions:  Note:Dose: 0.5MG      ??? olmesartan (BENICAR) 20 MG tablet Take 20 mg by mouth daily.     ??? PROGRAF 1 mg capsule TAKE 1 CAPSULE BY MOUTH TWICE DAILY 60 capsule 11   ??? simvastatin (ZOCOR) 10 MG tablet Take 10 mg by mouth nightly.     ??? verapamil (CALAN-SR) 240 MG CR tablet        No current facility-administered medications for this visit.         Changes to medications: Tamara Morris reports no changes at this time.    Allergies   Allergen Reactions   ??? Codeine Other (See Comments)   ??? Hydromorphone Other (See Comments)   ??? Meperidine Other (See Comments)       Changes to allergies: No    SPECIALTY MEDICATION ADHERENCE     Prograf 1mg   : 12 days of medicine on hand     Medication Adherence    Patient reported X missed doses in the last month: 0  Specialty Medication: prograf 1mg   Adherence tools used: patient uses a pill box to manage medications  Support network for adherence: family member          Specialty medication(s) dose(s) confirmed: Regimen is correct and unchanged.     Are there any concerns with adherence? No    Adherence counseling provided? Not needed    CLINICAL MANAGEMENT AND INTERVENTION      Clinical Benefit Assessment:    Do you feel the medicine is effective or helping your condition? Yes    Clinical Benefit counseling provided? Not needed    Adverse Effects Assessment:    Are you experiencing any side effects? No    Are you experiencing difficulty administering your medicine? No    Quality of Life Assessment:    How many days over the past month did your transplant  keep you from your normal activities? For example, brushing your teeth or getting up in the morning. 0    Have you discussed this with your provider? Not needed    Therapy Appropriateness:    Is therapy appropriate? Yes, therapy is appropriate and should be continued    DISEASE/MEDICATION-SPECIFIC INFORMATION      N/A    PATIENT SPECIFIC NEEDS     ? Does the patient have any physical, cognitive, or cultural barriers? No    ? Is the patient high risk? No     ? Does the patient require a Care Management Plan? No     ? Does the patient require physician intervention or other additional services (i.e. nutrition, smoking  cessation, social work)? No      SHIPPING     Specialty Medication(s) to be Shipped:   Transplant: Prograf 1mg     Other medication(s) to be shipped: n/a     Changes to insurance: No    Delivery Scheduled: Yes, Expected medication delivery date: 12/21/2018.     Medication will be delivered via UPS to the confirmed home address in [facility].    The patient will receive a drug information handout for each medication shipped and additional FDA Medication Guides as required.  Verified that patient has previously received a Conservation officer, historic buildings.    All of the patient's questions and concerns have been addressed.    Thad Ranger   [facility] Specialty Pharmacist

## 2018-12-17 DIAGNOSIS — E782 Mixed hyperlipidemia: Secondary | ICD-10-CM | POA: Diagnosis not present

## 2018-12-17 DIAGNOSIS — K580 Irritable bowel syndrome with diarrhea: Secondary | ICD-10-CM | POA: Diagnosis not present

## 2018-12-17 DIAGNOSIS — Z944 Liver transplant status: Secondary | ICD-10-CM | POA: Diagnosis not present

## 2018-12-17 DIAGNOSIS — G8929 Other chronic pain: Secondary | ICD-10-CM | POA: Diagnosis not present

## 2018-12-17 DIAGNOSIS — J069 Acute upper respiratory infection, unspecified: Secondary | ICD-10-CM | POA: Diagnosis not present

## 2018-12-17 DIAGNOSIS — R7301 Impaired fasting glucose: Secondary | ICD-10-CM | POA: Diagnosis not present

## 2018-12-17 DIAGNOSIS — Z6833 Body mass index (BMI) 33.0-33.9, adult: Secondary | ICD-10-CM | POA: Diagnosis not present

## 2018-12-17 DIAGNOSIS — N183 Chronic kidney disease, stage 3 (moderate): Secondary | ICD-10-CM | POA: Diagnosis not present

## 2018-12-17 DIAGNOSIS — F419 Anxiety disorder, unspecified: Secondary | ICD-10-CM | POA: Diagnosis not present

## 2018-12-17 DIAGNOSIS — R05 Cough: Secondary | ICD-10-CM | POA: Diagnosis not present

## 2018-12-17 DIAGNOSIS — I10 Essential (primary) hypertension: Secondary | ICD-10-CM | POA: Diagnosis not present

## 2018-12-20 MED FILL — PROGRAF 1 MG CAPSULE: ORAL | 30 days supply | Qty: 60 | Fill #6

## 2018-12-20 MED FILL — PROGRAF 1 MG CAPSULE: 30 days supply | Qty: 60 | Fill #6 | Status: AC

## 2018-12-25 DIAGNOSIS — I10 Essential (primary) hypertension: Secondary | ICD-10-CM

## 2018-12-25 NOTE — Unmapped (Signed)
Received vm from pt requesting return call. Called back and she wanted to report change to her BP meds. Updated medication list. Pt is ok with virtual visit for annual. Relayed info to TPA.

## 2018-12-31 DIAGNOSIS — Z79899 Other long term (current) drug therapy: Secondary | ICD-10-CM | POA: Diagnosis not present

## 2018-12-31 DIAGNOSIS — Z944 Liver transplant status: Secondary | ICD-10-CM | POA: Diagnosis not present

## 2019-01-02 LAB — ALT (SGPT): Lab: 27

## 2019-01-02 LAB — COMPREHENSIVE METABOLIC PANEL
ALKALINE PHOSPHATASE: 76 U/L
ALT (SGPT): 27 U/L
BILIRUBIN TOTAL: 0.4 mg/dL
CHLORIDE: 105 mmol/L
CO2: 23.2 mmol/L
CREATININE: 1.1 mg/dL
EGFR CKD-EPI AA FEMALE: 58 mL/min/{1.73_m2} — ABNORMAL LOW
EGFR CKD-EPI NON-AA FEMALE: 48 mL/min/{1.73_m2} — ABNORMAL LOW
GLUCOSE RANDOM: 111 mg/dL — ABNORMAL HIGH
POTASSIUM: 4.5 mmol/L
PROTEIN TOTAL: 6.6 g/dL
SODIUM: 137 mmol/L

## 2019-01-02 LAB — CBC W/ DIFFERENTIAL
BASOPHILS ABSOLUTE COUNT: 0.1 10*9/L
EOSINOPHILS ABSOLUTE COUNT: 0.4 10*9/L
HEMATOCRIT: 40.2 %
LYMPHOCYTES ABSOLUTE COUNT: 2.5 10*9/L
MONOCYTES ABSOLUTE COUNT: 0.8 10*9/L
NEUTROPHILS ABSOLUTE COUNT: 6.1 10*9/L
PLATELET COUNT: 261 10*9/L
WHITE BLOOD CELL COUNT: 9.8 10*9/L

## 2019-01-02 LAB — MAGNESIUM: Lab: 1.7

## 2019-01-02 LAB — BILIRUBIN DIRECT: Lab: 0.1

## 2019-01-02 LAB — BILIRUBIN, DIRECT: BILIRUBIN DIRECT: 0.1 mg/dL

## 2019-01-02 LAB — HEMOGLOBIN: Lab: 12.5

## 2019-01-02 LAB — GAMMA GLUTAMYL TRANSFERASE: Lab: 50

## 2019-01-02 LAB — PHOSPHORUS: Lab: 3.7

## 2019-01-02 LAB — ALBUMIN: Lab: 4.5

## 2019-01-02 LAB — TACROLIMUS, TROUGH: Lab: 3

## 2019-01-11 DIAGNOSIS — K580 Irritable bowel syndrome with diarrhea: Secondary | ICD-10-CM | POA: Diagnosis not present

## 2019-01-11 DIAGNOSIS — R7303 Prediabetes: Secondary | ICD-10-CM | POA: Diagnosis not present

## 2019-01-11 DIAGNOSIS — I10 Essential (primary) hypertension: Secondary | ICD-10-CM | POA: Diagnosis not present

## 2019-01-11 DIAGNOSIS — Z944 Liver transplant status: Secondary | ICD-10-CM | POA: Diagnosis not present

## 2019-01-11 DIAGNOSIS — F419 Anxiety disorder, unspecified: Secondary | ICD-10-CM | POA: Diagnosis not present

## 2019-01-11 DIAGNOSIS — G8929 Other chronic pain: Secondary | ICD-10-CM | POA: Diagnosis not present

## 2019-01-11 DIAGNOSIS — R7301 Impaired fasting glucose: Secondary | ICD-10-CM | POA: Diagnosis not present

## 2019-01-11 DIAGNOSIS — E782 Mixed hyperlipidemia: Secondary | ICD-10-CM | POA: Diagnosis not present

## 2019-01-15 NOTE — Unmapped (Signed)
[facility] Refill Coordination Note    Specialty Medication(s) to be Shipped:   Transplant: Prograf 1mg     Other medication(s) to be shipped: n/a     Tamara Morris, DOB: [birth year]  Phone: [phone] (home) [phone] (work)      All above HIPAA information was verified with patient.     Completed refill call assessment today to schedule patient's medication shipment from the [facility] [phone]).       Specialty medication(s) and dose(s) confirmed: Regimen is correct and unchanged.   Changes to medications: Tamara Morris reports no changes at this time.  Changes to insurance: No  Questions for the pharmacist: No    Confirmed patient received Welcome Packet with first shipment. The patient will receive a drug information handout for each medication shipped and additional FDA Medication Guides as required.       DISEASE/MEDICATION-SPECIFIC INFORMATION        N/A    SPECIALTY MEDICATION ADHERENCE     Medication Adherence    Patient reported X missed doses in the last month: 0  Specialty Medication: Prograf  Patient is on additional specialty medications: No  Patient is on more than two specialty medications: No  Any gaps in refill history greater than 2 weeks in the last 3 months: no  Demonstrates understanding of importance of adherence: yes  Informant: patient  Adherence tools used: patient uses a pill box to manage medications  Support network for adherence: family member                Prograf 1mg : Patient has 7 days of medication on hand      SHIPPING     Shipping address confirmed in Epic.     Delivery Scheduled: Yes, Expected medication delivery date: 01/18/19.     Medication will be delivered via UPS to the home address in Epic W[facility].    Tamara Morris   [facility] Specialty Technician

## 2019-01-17 MED FILL — PROGRAF 1 MG CAPSULE: 30 days supply | Qty: 60 | Fill #7 | Status: AC

## 2019-01-17 MED FILL — PROGRAF 1 MG CAPSULE: ORAL | 30 days supply | Qty: 60 | Fill #7

## 2019-01-18 ENCOUNTER — Ambulatory Visit (HOSPITAL_COMMUNITY): Payer: Medicare Other

## 2019-01-18 DIAGNOSIS — F411 Generalized anxiety disorder: Secondary | ICD-10-CM | POA: Diagnosis not present

## 2019-01-18 DIAGNOSIS — I129 Hypertensive chronic kidney disease with stage 1 through stage 4 chronic kidney disease, or unspecified chronic kidney disease: Secondary | ICD-10-CM | POA: Diagnosis not present

## 2019-01-18 DIAGNOSIS — M542 Cervicalgia: Secondary | ICD-10-CM | POA: Diagnosis not present

## 2019-01-18 DIAGNOSIS — K580 Irritable bowel syndrome with diarrhea: Secondary | ICD-10-CM | POA: Diagnosis not present

## 2019-01-18 DIAGNOSIS — E782 Mixed hyperlipidemia: Secondary | ICD-10-CM | POA: Diagnosis not present

## 2019-01-18 DIAGNOSIS — E669 Obesity, unspecified: Secondary | ICD-10-CM | POA: Diagnosis not present

## 2019-01-18 DIAGNOSIS — N1831 Chronic kidney disease, stage 3a: Secondary | ICD-10-CM | POA: Diagnosis not present

## 2019-01-18 DIAGNOSIS — E875 Hyperkalemia: Secondary | ICD-10-CM | POA: Diagnosis not present

## 2019-01-18 DIAGNOSIS — Z944 Liver transplant status: Secondary | ICD-10-CM | POA: Diagnosis not present

## 2019-01-18 DIAGNOSIS — R7301 Impaired fasting glucose: Secondary | ICD-10-CM | POA: Diagnosis not present

## 2019-01-18 DIAGNOSIS — Z6833 Body mass index (BMI) 33.0-33.9, adult: Secondary | ICD-10-CM | POA: Diagnosis not present

## 2019-01-18 DIAGNOSIS — R7303 Prediabetes: Secondary | ICD-10-CM | POA: Diagnosis not present

## 2019-02-01 ENCOUNTER — Other Ambulatory Visit: Payer: Self-pay

## 2019-02-01 ENCOUNTER — Ambulatory Visit (HOSPITAL_COMMUNITY)
Admission: RE | Admit: 2019-02-01 | Discharge: 2019-02-01 | Disposition: A | Discharge status: Home / Self Care | Payer: Medicare Other | Source: Ambulatory Visit | Attending: Internal Medicine | Admitting: Internal Medicine

## 2019-02-01 DIAGNOSIS — Z1231 Encounter for screening mammogram for malignant neoplasm of breast: Secondary | ICD-10-CM

## 2019-02-04 DIAGNOSIS — Z23 Encounter for immunization: Secondary | ICD-10-CM | POA: Diagnosis not present

## 2019-02-11 NOTE — Unmapped (Signed)
[facility] Refill Coordination Note    Specialty Medication(s) to be Shipped:   Transplant: Prograf 1mg     Other medication(s) to be shipped: n/a     Tamara Morris, DOB: 1940/04/17  Phone: [phone] (home) [phone] (work)      All above HIPAA information was verified with patient.     Completed refill call assessment today to schedule patient's medication shipment from the [facility] [phone]).       Specialty medication(s) and dose(s) confirmed: Regimen is correct and unchanged.   Changes to medications: Brendi reports no changes at this time.  Changes to insurance: No  Questions for the pharmacist: No    Confirmed patient received Welcome Packet with first shipment. The patient will receive a drug information handout for each medication shipped and additional FDA Medication Guides as required.       DISEASE/MEDICATION-SPECIFIC INFORMATION        N/A    SPECIALTY MEDICATION ADHERENCE     Medication Adherence    Patient reported X missed doses in the last month: 0  Specialty Medication: Prograf  Patient is on additional specialty medications: No  Patient is on more than two specialty medications: No  Any gaps in refill history greater than 2 weeks in the last 3 months: no  Demonstrates understanding of importance of adherence: yes  Informant: patient  Adherence tools used: patient uses a pill box to manage medications  Support network for adherence: family member              Prograf 1mg : Patient has 7 days of medication on hand      SHIPPING     Shipping address confirmed in Epic.     Delivery Scheduled: Yes, Expected medication delivery date: 02/14/19.     Medication will be delivered via UPS to the prescription address in Epic W[facility].    Olga Millers   [facility] Specialty Technician

## 2019-02-13 MED FILL — PROGRAF 1 MG CAPSULE: 30 days supply | Qty: 60 | Fill #8 | Status: AC

## 2019-02-13 MED FILL — PROGRAF 1 MG CAPSULE: ORAL | 30 days supply | Qty: 60 | Fill #8

## 2019-02-15 DIAGNOSIS — E782 Mixed hyperlipidemia: Secondary | ICD-10-CM | POA: Diagnosis not present

## 2019-02-15 DIAGNOSIS — Z944 Liver transplant status: Secondary | ICD-10-CM | POA: Diagnosis not present

## 2019-02-15 DIAGNOSIS — G8929 Other chronic pain: Secondary | ICD-10-CM | POA: Diagnosis not present

## 2019-02-15 DIAGNOSIS — I10 Essential (primary) hypertension: Secondary | ICD-10-CM | POA: Diagnosis not present

## 2019-02-15 DIAGNOSIS — R7301 Impaired fasting glucose: Secondary | ICD-10-CM | POA: Diagnosis not present

## 2019-02-15 DIAGNOSIS — J069 Acute upper respiratory infection, unspecified: Secondary | ICD-10-CM | POA: Diagnosis not present

## 2019-02-15 DIAGNOSIS — F419 Anxiety disorder, unspecified: Secondary | ICD-10-CM | POA: Diagnosis not present

## 2019-02-15 DIAGNOSIS — K580 Irritable bowel syndrome with diarrhea: Secondary | ICD-10-CM | POA: Diagnosis not present

## 2019-02-22 DIAGNOSIS — H35363 Drusen (degenerative) of macula, bilateral: Secondary | ICD-10-CM | POA: Diagnosis not present

## 2019-03-11 NOTE — Unmapped (Signed)
[facility] Clinical Assessment & Refill Coordination Note    Tamara Morris, DOB: Oct 22, 1940  Phone: [phone] (home) [phone] (work)    All above HIPAA information was verified with patient.     Was a Nurse, learning disability used for this call? No    Specialty Medication(s):   Transplant: Prograf 1mg      Current Outpatient Medications   Medication Sig Dispense Refill   ??? acetaminophen (TYLENOL) 325 MG tablet Take 500 mg by mouth every six (6) hours as needed for pain.      ??? hydrALAZINE (APRESOLINE) 25 MG tablet Take 25 mg by mouth Two (2) times a day.     ??? loperamide (IMODIUM A-D) 2 mg tablet Take 2 mg by mouth. Frequency:PRN   Dosage:2   MG  Instructions:  Note:Dose: 2MG      ??? LORazepam (ATIVAN) 0.5 MG tablet Take 0.5 mg by mouth. Frequency:PRN   Dosage:0.5   MG  Instructions:  Note:Dose: 0.5MG      ??? losartan (COZAAR) 100 MG tablet Take 100 mg by mouth daily.     ??? olmesartan (BENICAR) 20 MG tablet Take 20 mg by mouth daily.     ??? PROGRAF 1 mg capsule TAKE 1 CAPSULE BY MOUTH TWICE DAILY 60 capsule 11   ??? simvastatin (ZOCOR) 10 MG tablet Take 10 mg by mouth nightly.     ??? verapamil (CALAN-SR) 240 MG CR tablet        No current facility-administered medications for this visit.         Changes to medications: Nikko reports no changes at this time.    Allergies   Allergen Reactions   ??? Codeine Other (See Comments)   ??? Hydromorphone Other (See Comments)   ??? Meperidine Other (See Comments)       Changes to allergies: No    SPECIALTY MEDICATION ADHERENCE     Prograf 1 mg: 5 days of medicine on hand     Medication Adherence    Patient reported X missed doses in the last month: 0  Specialty Medication: Prograf 1mg   Patient is on additional specialty medications: No  Adherence tools used: patient uses a pill box to manage medications  Support network for adherence: family member          Specialty medication(s) dose(s) confirmed: Regimen is correct and unchanged. Are there any concerns with adherence? No    Adherence counseling provided? Not needed    CLINICAL MANAGEMENT AND INTERVENTION      Clinical Benefit Assessment:    Do you feel the medicine is effective or helping your condition? Yes    Clinical Benefit counseling provided? Not needed    Adverse Effects Assessment:    Are you experiencing any side effects? No    Are you experiencing difficulty administering your medicine? No    Quality of Life Assessment:    How many days over the past month did your liver transplant  keep you from your normal activities? For example, brushing your teeth or getting up in the morning. 0    Have you discussed this with your provider? Not needed    Therapy Appropriateness:    Is therapy appropriate? Yes, therapy is appropriate and should be continued    DISEASE/MEDICATION-SPECIFIC INFORMATION      N/A    PATIENT SPECIFIC NEEDS     ? Does the patient have any physical, cognitive, or cultural barriers? No    ? Is the patient high risk? No     ?  Does the patient require a Care Management Plan? No     ? Does the patient require physician intervention or other additional services (i.e. nutrition, smoking cessation, social work)? No      SHIPPING     Specialty Medication(s) to be Shipped:   Transplant: Prograf 1mg     Other medication(s) to be shipped: none     Changes to insurance: No    Delivery Scheduled: Yes, Expected medication delivery date: 03/13/2019.     Medication will be delivered via UPS to the confirmed prescription address in [facility]    The patient will receive a drug information handout for each medication shipped and additional FDA Medication Guides as required.  Verified that patient has previously received a Conservation officer, historic buildings.    All of the patient's questions and concerns have been addressed.    Tera Helper   [facility] Specialty Pharmacist

## 2019-03-11 NOTE — Unmapped (Signed)
Received vm from pt requesting Shared Services phone number. Called back and pt had already found it. She stated she will get labs drawn later this week.  She also wanted to know when she will have an annual visit. Explained to her that NP is on maternity leave and she will be scheduled around February and will have option for virtual visit. Pt verbalized understanding.

## 2019-03-12 MED FILL — PROGRAF 1 MG CAPSULE: ORAL | 30 days supply | Qty: 60 | Fill #9

## 2019-03-12 MED FILL — PROGRAF 1 MG CAPSULE: 30 days supply | Qty: 60 | Fill #9 | Status: AC

## 2019-03-14 DIAGNOSIS — Z79899 Other long term (current) drug therapy: Secondary | ICD-10-CM | POA: Diagnosis not present

## 2019-03-14 DIAGNOSIS — Z944 Liver transplant status: Secondary | ICD-10-CM | POA: Diagnosis not present

## 2019-03-15 LAB — CBC W/ DIFFERENTIAL
BASOPHILS ABSOLUTE COUNT: 0.1 10*9/L
HEMATOCRIT: 42.1 %
HEMOGLOBIN: 13.1 g/dL
LYMPHOCYTES ABSOLUTE COUNT: 2.5 10*9/L
MONOCYTES ABSOLUTE COUNT: 0.9 10*9/L
NEUTROPHILS ABSOLUTE COUNT: 6.2 10*9/L
PLATELET COUNT: 275 10*9/L
WHITE BLOOD CELL COUNT: 9.9 10*9/L

## 2019-03-15 LAB — COMPREHENSIVE METABOLIC PANEL
ALKALINE PHOSPHATASE: 87 U/L
ALT (SGPT): 27 U/L
AST (SGOT): 26 U/L
BILIRUBIN TOTAL: 0.4 mg/dL
BLOOD UREA NITROGEN: 25 mg/dL — ABNORMAL HIGH
CALCIUM: 9.6 mg/dL
CHLORIDE: 103 mmol/L
CO2: 27.1 mmol/L
EGFR CKD-EPI AA FEMALE: 58 mL/min/{1.73_m2} — ABNORMAL LOW
POTASSIUM: 4.6 mmol/L
PROTEIN TOTAL: 6.9 g/dL
SODIUM: 138 mmol/L

## 2019-03-15 LAB — PHOSPHORUS: Lab: 3.3

## 2019-03-15 LAB — BILIRUBIN DIRECT: Lab: 0.1

## 2019-03-15 LAB — ALBUMIN: Lab: 4.3

## 2019-03-15 LAB — MAGNESIUM: Lab: 1.9

## 2019-03-15 LAB — MEAN CORPUSCULAR VOLUME: Lab: 0

## 2019-03-15 LAB — GAMMA GLUTAMYL TRANSFERASE: Lab: 40

## 2019-03-15 LAB — CALCIUM: Lab: 9.6

## 2019-03-18 LAB — TACROLIMUS, TROUGH: Lab: 3.8

## 2019-03-27 ENCOUNTER — Ambulatory Visit: Payer: Medicare Other | Admitting: Vascular Surgery

## 2019-03-27 ENCOUNTER — Encounter (HOSPITAL_COMMUNITY): Payer: Medicare Other

## 2019-04-02 NOTE — Unmapped (Signed)
[facility] Clinical Assessment & Refill Coordination Note    Tamara Morris, DOB: [birth year]  Phone: [phone] (home) [phone] (work)    All above HIPAA information was verified with patient.     Was a Nurse, learning disability used for this call? No    Specialty Medication(s):   Transplant: Prograf 1mg      Current Outpatient Medications   Medication Sig Dispense Refill   ??? acetaminophen (TYLENOL) 325 MG tablet Take 500 mg by mouth every six (6) hours as needed for pain.      ??? hydrALAZINE (APRESOLINE) 25 MG tablet Take 25 mg by mouth Two (2) times a day.     ??? loperamide (IMODIUM A-D) 2 mg tablet Take 2 mg by mouth. Frequency:PRN   Dosage:2   MG  Instructions:  Note:Dose: 2MG      ??? LORazepam (ATIVAN) 0.5 MG tablet Take 0.5 mg by mouth. Frequency:PRN   Dosage:0.5   MG  Instructions:  Note:Dose: 0.5MG      ??? losartan (COZAAR) 100 MG tablet Take 100 mg by mouth daily.     ??? olmesartan (BENICAR) 20 MG tablet Take 20 mg by mouth daily.     ??? PROGRAF 1 mg capsule TAKE 1 CAPSULE BY MOUTH TWICE DAILY 60 capsule 11   ??? simvastatin (ZOCOR) 10 MG tablet Take 10 mg by mouth nightly.     ??? verapamil (CALAN-SR) 240 MG CR tablet        No current facility-administered medications for this visit.         Changes to medications: Holden reports no changes at this time.    Allergies   Allergen Reactions   ??? Codeine Other (See Comments)   ??? Hydromorphone Other (See Comments)   ??? Meperidine Other (See Comments)       Changes to allergies: No    SPECIALTY MEDICATION ADHERENCE     Prograf 1mg   : 10 days of medicine on hand     Medication Adherence    Patient reported X missed doses in the last month: 0  Specialty Medication: prograf 1mg   Adherence tools used: patient uses a pill box to manage medications  Support network for adherence: family member          Specialty medication(s) dose(s) confirmed: Regimen is correct and unchanged.     Are there any concerns with adherence? No Adherence counseling provided? Not needed    CLINICAL MANAGEMENT AND INTERVENTION      Clinical Benefit Assessment:    Do you feel the medicine is effective or helping your condition? Yes    Clinical Benefit counseling provided? Not needed    Adverse Effects Assessment:    Are you experiencing any side effects? No    Are you experiencing difficulty administering your medicine? No    Quality of Life Assessment:    How many days over the past month did your transplant  keep you from your normal activities? For example, brushing your teeth or getting up in the morning. 0    Have you discussed this with your provider? Not needed    Therapy Appropriateness:    Is therapy appropriate? Yes, therapy is appropriate and should be continued    DISEASE/MEDICATION-SPECIFIC INFORMATION      N/A    PATIENT SPECIFIC NEEDS     ? Does the patient have any physical, cognitive, or cultural barriers? No    ? Is the patient high risk? No     ? Does the patient  require a Care Management Plan? No     ? Does the patient require physician intervention or other additional services (i.e. nutrition, smoking cessation, social work)? No      SHIPPING     Specialty Medication(s) to be Shipped:   Transplant: Prograf 1mg     Other medication(s) to be shipped: na     Changes to insurance: No    Delivery Scheduled: Yes, Expected medication delivery date: 04/09/19.     Medication will be delivered via UPS to the confirmed prescription address in [facility].    The patient will receive a drug information handout for each medication shipped and additional FDA Medication Guides as required.  Verified that patient has previously received a Conservation officer, historic buildings.    All of the patient's questions and concerns have been addressed.    Thad Ranger   [facility] Specialty Pharmacist

## 2019-04-06 DIAGNOSIS — Z944 Liver transplant status: Principal | ICD-10-CM

## 2019-04-08 NOTE — Unmapped (Signed)
Tamara Morris 's prograf shipment will be delayed as a result of a high copay.     I have reached out to the patient and left a voicemail message.  We will wait for a call back from the patient to reschedule the delivery.  We have not confirmed the new delivery date.

## 2019-04-09 DIAGNOSIS — Z944 Liver transplant status: Secondary | ICD-10-CM | POA: Diagnosis not present

## 2019-04-09 DIAGNOSIS — K580 Irritable bowel syndrome with diarrhea: Secondary | ICD-10-CM | POA: Diagnosis not present

## 2019-04-09 DIAGNOSIS — F419 Anxiety disorder, unspecified: Secondary | ICD-10-CM | POA: Diagnosis not present

## 2019-04-09 DIAGNOSIS — R7301 Impaired fasting glucose: Secondary | ICD-10-CM | POA: Diagnosis not present

## 2019-04-09 DIAGNOSIS — I10 Essential (primary) hypertension: Secondary | ICD-10-CM | POA: Diagnosis not present

## 2019-04-09 DIAGNOSIS — G8929 Other chronic pain: Secondary | ICD-10-CM | POA: Diagnosis not present

## 2019-04-09 DIAGNOSIS — E782 Mixed hyperlipidemia: Secondary | ICD-10-CM | POA: Diagnosis not present

## 2019-04-09 DIAGNOSIS — E7849 Other hyperlipidemia: Secondary | ICD-10-CM | POA: Diagnosis not present

## 2019-04-09 MED FILL — PROGRAF 1 MG CAPSULE: 30 days supply | Qty: 60 | Fill #10 | Status: AC

## 2019-04-09 MED FILL — PROGRAF 1 MG CAPSULE: ORAL | 30 days supply | Qty: 60 | Fill #10

## 2019-04-09 NOTE — Unmapped (Signed)
[name] 's prograf shipment will be delivered on 04/10/19 via UPS

## 2019-04-10 ENCOUNTER — Ambulatory Visit: Payer: Medicare Other | Admitting: Vascular Surgery

## 2019-04-10 ENCOUNTER — Encounter (HOSPITAL_COMMUNITY): Payer: Medicare Other

## 2019-04-12 NOTE — Unmapped (Signed)
Called pt and informed her that starting 04/14/2018 she will be eligible to get COVID-19 vaccine. Pt stated she will reach out to local hospital next week to schedule.

## 2019-04-19 DIAGNOSIS — Z79899 Other long term (current) drug therapy: Secondary | ICD-10-CM | POA: Diagnosis not present

## 2019-04-19 DIAGNOSIS — Z944 Liver transplant status: Secondary | ICD-10-CM | POA: Diagnosis not present

## 2019-05-01 NOTE — Unmapped (Signed)
05/01/19 Patient called back stating that she currently have about Two weeks of medication Prograf remaining. I informed her that I will give her call back on 05-08-19. sed

## 2019-05-06 DIAGNOSIS — E7849 Other hyperlipidemia: Secondary | ICD-10-CM | POA: Diagnosis not present

## 2019-05-06 DIAGNOSIS — F419 Anxiety disorder, unspecified: Secondary | ICD-10-CM | POA: Diagnosis not present

## 2019-05-06 DIAGNOSIS — R7301 Impaired fasting glucose: Secondary | ICD-10-CM | POA: Diagnosis not present

## 2019-05-06 DIAGNOSIS — I10 Essential (primary) hypertension: Secondary | ICD-10-CM | POA: Diagnosis not present

## 2019-05-06 DIAGNOSIS — Z944 Liver transplant status: Secondary | ICD-10-CM | POA: Diagnosis not present

## 2019-05-06 DIAGNOSIS — G8929 Other chronic pain: Secondary | ICD-10-CM | POA: Diagnosis not present

## 2019-05-06 DIAGNOSIS — K580 Irritable bowel syndrome with diarrhea: Secondary | ICD-10-CM | POA: Diagnosis not present

## 2019-05-06 DIAGNOSIS — E782 Mixed hyperlipidemia: Secondary | ICD-10-CM | POA: Diagnosis not present

## 2019-05-07 NOTE — Unmapped (Signed)
[facility] Clinical Assessment & Refill Coordination Note    Tamara Morris, DOB: 08/02/1940  Phone: [phone] (home) [phone] (work)    All above HIPAA information was verified with patient.     Was a Nurse, learning disability used for this call? No    Specialty Medication(s):   Transplant: Prograf 1mg      Current Outpatient Medications   Medication Sig Dispense Refill   ??? acetaminophen (TYLENOL) 325 MG tablet Take 500 mg by mouth every six (6) hours as needed for pain.      ??? hydrALAZINE (APRESOLINE) 25 MG tablet Take 25 mg by mouth Two (2) times a day.     ??? loperamide (IMODIUM A-D) 2 mg tablet Take 2 mg by mouth. Frequency:PRN   Dosage:2   MG  Instructions:  Note:Dose: 2MG      ??? LORazepam (ATIVAN) 0.5 MG tablet Take 0.5 mg by mouth. Frequency:PRN   Dosage:0.5   MG  Instructions:  Note:Dose: 0.5MG      ??? losartan (COZAAR) 100 MG tablet Take 100 mg by mouth daily.     ??? olmesartan (BENICAR) 20 MG tablet Take 20 mg by mouth daily.     ??? PROGRAF 1 mg capsule TAKE 1 CAPSULE BY MOUTH TWICE DAILY 60 capsule 11   ??? simvastatin (ZOCOR) 10 MG tablet Take 10 mg by mouth nightly.     ??? verapamil (CALAN-SR) 240 MG CR tablet        No current facility-administered medications for this visit.         Changes to medications: Bernedette reports starting the following medications: asa 81mg     Allergies   Allergen Reactions   ??? Codeine Other (See Comments)   ??? Hydromorphone Other (See Comments)   ??? Meperidine Other (See Comments)       Changes to allergies: No    SPECIALTY MEDICATION ADHERENCE     Prograf 1mg   : 5 days of medicine on hand     Medication Adherence    Patient reported X missed doses in the last month: 0  Specialty Medication: prograf 1mg   Adherence tools used: patient uses a pill box to manage medications  Support network for adherence: family member          Specialty medication(s) dose(s) confirmed: Regimen is correct and unchanged.     Are there any concerns with adherence? No    Adherence counseling provided? Not needed    CLINICAL MANAGEMENT AND INTERVENTION      Clinical Benefit Assessment:    Do you feel the medicine is effective or helping your condition? Yes    Clinical Benefit counseling provided? Not needed    Adverse Effects Assessment:    Are you experiencing any side effects? No    Are you experiencing difficulty administering your medicine? No    Quality of Life Assessment:    How many days over the past month did your transplant  keep you from your normal activities? For example, brushing your teeth or getting up in the morning. 0    Have you discussed this with your provider? Not needed    Therapy Appropriateness:    Is therapy appropriate? Yes, therapy is appropriate and should be continued    DISEASE/MEDICATION-SPECIFIC INFORMATION      N/A    PATIENT SPECIFIC NEEDS     ? Does the patient have any physical, cognitive, or cultural barriers? No    ? Is the patient high risk? No     ?  Does the patient require a Care Management Plan? No     ? Does the patient require physician intervention or other additional services (i.e. nutrition, smoking cessation, social work)? No      SHIPPING     Specialty Medication(s) to be Shipped:   Transplant: Prograf 1mg     Other medication(s) to be shipped: na     Changes to insurance: No    Delivery Scheduled: Yes, Expected medication delivery date: 05/09/2019.     Medication will be delivered via UPS to the confirmed prescription address in [facility].    The patient will receive a drug information handout for each medication shipped and additional FDA Medication Guides as required.  Verified that patient has previously received a Conservation officer, historic buildings.    All of the patient's questions and concerns have been addressed.    Thad Ranger   [facility] Specialty Pharmacist

## 2019-05-08 MED FILL — PROGRAF 1 MG CAPSULE: 30 days supply | Qty: 60 | Fill #11 | Status: AC

## 2019-05-08 MED FILL — PROGRAF 1 MG CAPSULE: ORAL | 30 days supply | Qty: 60 | Fill #11

## 2019-05-22 ENCOUNTER — Ambulatory Visit: Payer: Medicare Other | Admitting: Vascular Surgery

## 2019-05-22 ENCOUNTER — Encounter (HOSPITAL_COMMUNITY): Payer: Medicare Other

## 2019-05-31 NOTE — Unmapped (Signed)
Received call from pt reporting that she got 1st Moderna vaccine on 2/24. Updated pt's immunization record.

## 2019-06-03 DIAGNOSIS — Z944 Liver transplant status: Principal | ICD-10-CM

## 2019-06-03 MED ORDER — PROGRAF 1 MG CAPSULE
ORAL_CAPSULE | Freq: Two times a day (BID) | ORAL | 11 refills | 30 days | Status: CP
Start: 2019-06-03 — End: 2020-06-02
  Filled 2019-06-10: qty 60, 30d supply, fill #0

## 2019-06-06 NOTE — Unmapped (Signed)
[facility] Refill Coordination Note    Specialty Medication(s) to be Shipped:   Transplant: Prograf 1mg     Other medication(s) to be shipped: N/A     Tamara Morris, DOB: 01/24/1941  Phone: [phone] (home) [phone] (work)      All above HIPAA information was verified with patient.     Was a Nurse, learning disability used for this call? No    Completed refill call assessment today to schedule patient's medication shipment from the [facility] [phone]).       Specialty medication(s) and dose(s) confirmed: Regimen is correct and unchanged.   Changes to medications: Tamara Morris reports no changes at this time.  Changes to insurance: No  Questions for the pharmacist: No    Confirmed patient received Welcome Packet with first shipment. The patient will receive a drug information handout for each medication shipped and additional FDA Medication Guides as required.       DISEASE/MEDICATION-SPECIFIC INFORMATION        N/A    SPECIALTY MEDICATION ADHERENCE     Medication Adherence    Patient reported X missed doses in the last month: 0  Specialty Medication: Prograf 1mg   Patient is on additional specialty medications: No  Adherence tools used: patient uses a pill box to manage medications  Support network for adherence: family member          Prograf 1 mg: 9 days of medicine on hand     SHIPPING     Shipping address confirmed in Epic.     Delivery Scheduled: Yes, Expected medication delivery date: 06/11/2019.     Medication will be delivered via UPS to the prescription address in Epic [facility].    Tamara Morris [facility] Specialty Technician

## 2019-06-10 MED FILL — PROGRAF 1 MG CAPSULE: 30 days supply | Qty: 60 | Fill #0 | Status: AC

## 2019-06-14 DIAGNOSIS — Z944 Liver transplant status: Secondary | ICD-10-CM | POA: Diagnosis not present

## 2019-06-14 DIAGNOSIS — Z79899 Other long term (current) drug therapy: Secondary | ICD-10-CM | POA: Diagnosis not present

## 2019-06-17 DIAGNOSIS — J060 Acute laryngopharyngitis: Secondary | ICD-10-CM | POA: Diagnosis not present

## 2019-06-17 DIAGNOSIS — Z944 Liver transplant status: Secondary | ICD-10-CM | POA: Diagnosis not present

## 2019-06-17 DIAGNOSIS — Z6833 Body mass index (BMI) 33.0-33.9, adult: Secondary | ICD-10-CM | POA: Diagnosis not present

## 2019-06-17 DIAGNOSIS — M542 Cervicalgia: Secondary | ICD-10-CM | POA: Diagnosis not present

## 2019-06-17 DIAGNOSIS — E782 Mixed hyperlipidemia: Secondary | ICD-10-CM | POA: Diagnosis not present

## 2019-06-17 DIAGNOSIS — I10 Essential (primary) hypertension: Secondary | ICD-10-CM | POA: Diagnosis not present

## 2019-06-17 DIAGNOSIS — R05 Cough: Secondary | ICD-10-CM | POA: Diagnosis not present

## 2019-06-17 DIAGNOSIS — R7303 Prediabetes: Secondary | ICD-10-CM | POA: Diagnosis not present

## 2019-06-17 DIAGNOSIS — F419 Anxiety disorder, unspecified: Secondary | ICD-10-CM | POA: Diagnosis not present

## 2019-06-17 DIAGNOSIS — J302 Other seasonal allergic rhinitis: Secondary | ICD-10-CM | POA: Diagnosis not present

## 2019-06-17 DIAGNOSIS — N183 Chronic kidney disease, stage 3 unspecified: Secondary | ICD-10-CM | POA: Diagnosis not present

## 2019-06-17 DIAGNOSIS — Z6834 Body mass index (BMI) 34.0-34.9, adult: Secondary | ICD-10-CM | POA: Diagnosis not present

## 2019-06-17 LAB — CBC W/ DIFFERENTIAL
BASOPHILS ABSOLUTE COUNT: 0.1 10*9/L
EOSINOPHILS ABSOLUTE COUNT: 0.3 10*9/L
HEMATOCRIT: 43.1 %
HEMOGLOBIN: 13.5 g/dL
LYMPHOCYTES ABSOLUTE COUNT: 2.5 10*9/L
MONOCYTES ABSOLUTE COUNT: 0.8 10*9/L
NEUTROPHILS ABSOLUTE COUNT: 5.2 10*9/L
PLATELET COUNT: 269 10*9/L
WHITE BLOOD CELL COUNT: 8.8 10*9/L

## 2019-06-17 LAB — COMPREHENSIVE METABOLIC PANEL
ALKALINE PHOSPHATASE: 82 U/L
ALT (SGPT): 15 U/L
AST (SGOT): 25 U/L
BILIRUBIN TOTAL: 0.5 mg/dL
CALCIUM: 8.9 mg/dL
CHLORIDE: 102 mmol/L
CO2: 23.6 mmol/L
CREATININE: 1.08 mg/dL
POTASSIUM: 4.5 mmol/L
PROTEIN TOTAL: 6.4 g/dL
SODIUM: 138 mmol/L

## 2019-06-17 LAB — PHOSPHORUS: Lab: 3.9

## 2019-06-17 LAB — GAMMA GLUTAMYL TRANSFERASE: Lab: 49

## 2019-06-17 LAB — ALBUMIN: Lab: 4.2

## 2019-06-17 LAB — EGFR CKD-EPI AA FEMALE: Lab: 0

## 2019-06-17 LAB — BILIRUBIN DIRECT: Lab: 0.1

## 2019-06-17 LAB — BASOPHILS ABSOLUTE COUNT: Lab: 0.1

## 2019-06-17 LAB — MAGNESIUM: Lab: 1.8

## 2019-06-19 LAB — TACROLIMUS, TROUGH: Lab: 4.2

## 2019-06-28 DIAGNOSIS — K580 Irritable bowel syndrome with diarrhea: Secondary | ICD-10-CM | POA: Diagnosis not present

## 2019-06-28 DIAGNOSIS — E782 Mixed hyperlipidemia: Secondary | ICD-10-CM | POA: Diagnosis not present

## 2019-06-28 DIAGNOSIS — Z944 Liver transplant status: Secondary | ICD-10-CM | POA: Diagnosis not present

## 2019-06-28 DIAGNOSIS — E875 Hyperkalemia: Secondary | ICD-10-CM | POA: Diagnosis not present

## 2019-06-28 DIAGNOSIS — R7303 Prediabetes: Secondary | ICD-10-CM | POA: Diagnosis not present

## 2019-06-28 DIAGNOSIS — E669 Obesity, unspecified: Secondary | ICD-10-CM | POA: Diagnosis not present

## 2019-06-28 DIAGNOSIS — F411 Generalized anxiety disorder: Secondary | ICD-10-CM | POA: Diagnosis not present

## 2019-06-28 DIAGNOSIS — R7301 Impaired fasting glucose: Secondary | ICD-10-CM | POA: Diagnosis not present

## 2019-06-28 DIAGNOSIS — N1831 Chronic kidney disease, stage 3a: Secondary | ICD-10-CM | POA: Diagnosis not present

## 2019-06-28 DIAGNOSIS — I129 Hypertensive chronic kidney disease with stage 1 through stage 4 chronic kidney disease, or unspecified chronic kidney disease: Secondary | ICD-10-CM | POA: Diagnosis not present

## 2019-06-28 DIAGNOSIS — M542 Cervicalgia: Secondary | ICD-10-CM | POA: Diagnosis not present

## 2019-06-28 DIAGNOSIS — Z6833 Body mass index (BMI) 33.0-33.9, adult: Secondary | ICD-10-CM | POA: Diagnosis not present

## 2019-07-02 ENCOUNTER — Other Ambulatory Visit: Payer: Self-pay | Admitting: *Deleted

## 2019-07-02 DIAGNOSIS — I6523 Occlusion and stenosis of bilateral carotid arteries: Secondary | ICD-10-CM

## 2019-07-03 ENCOUNTER — Ambulatory Visit (HOSPITAL_COMMUNITY)
Admission: RE | Admit: 2019-07-03 | Discharge: 2019-07-03 | Disposition: A | Discharge status: Home / Self Care | Payer: Medicare Other | Source: Ambulatory Visit | Attending: Vascular Surgery | Admitting: Vascular Surgery

## 2019-07-03 ENCOUNTER — Encounter: Payer: Self-pay | Admitting: Vascular Surgery

## 2019-07-03 ENCOUNTER — Ambulatory Visit (INDEPENDENT_AMBULATORY_CARE_PROVIDER_SITE_OTHER): Payer: Medicare Other | Admitting: Vascular Surgery

## 2019-07-03 ENCOUNTER — Other Ambulatory Visit: Payer: Self-pay

## 2019-07-03 VITALS — BP 132/80 | HR 78 | Temp 97.6°F | Resp 20 | Ht 64.0 in | Wt 200.0 lb

## 2019-07-03 DIAGNOSIS — I6523 Occlusion and stenosis of bilateral carotid arteries: Secondary | ICD-10-CM | POA: Diagnosis not present

## 2019-07-03 NOTE — Progress Notes (Signed)
Patient name: Linda Barr MRN: [id number] DOB: 1940/04/23 Sex: female  REASON FOR VISIT:   Follow-up of right carotid stenosis.  HPI:   Linda Barr is a pleasant 79 y.o. female who I last saw on 02/21/2018.  She had had a carotid bruit which prompted her duplex scan.  She was found to have a 40 to 59% right carotid stenosis with no significant stenosis on the left.  She comes in for a 1 year follow-up visit.  Since I saw her last, she denies any history of stroke, TIAs, expressive or receptive aphasia, or amaurosis fugax.  She is on aspirin and is on a statin. She is not a smoker. She has been working on her nutrition and has cut back on processed meats.  She had her liver transplant 27 years ago and has done very well from that standpoint. This was done in [place].  Past Medical History:  Diagnosis Date  . Anxiety   . Arthritis   . Chronic back pain   . Essential hypertension    History of ventricular [place], followed up with cardiologist ,   . Gout   . History of kidney stones   . Hx of liver transplant ([place])    1994, 1995 at [facility]  . Hyperlipidemia   . Kidney stones   . Palpitations     Family History  Problem Relation Age of Onset  . Stroke Mother   . Stroke Other     SOCIAL HISTORY: Social History   Tobacco Use  . Smoking status: Former Smoker    Packs/day: 1.00    Years: 12.00    Pack years: 12.00    Types: Cigarettes    Quit date: 04/29/1990    Years since quitting: 29.1  . Smokeless tobacco: Former Systems developer    Quit date: 04/04/1989  Substance Use Topics  . Alcohol use: No    Allergies  Allergen Reactions  . Codeine Other (See Comments)    Hallucinate, vomiting and sweating  . Demerol [Meperidine] Other (See Comments)    Hallucinations, vomiting and sweating.   . Dilaudid [Hydromorphone Hcl] Other (See Comments)    Hallucinations, sweating and vomiting  . Hydromorphone Other (See Comments)  . Morphine And Related Other (See Comments)   Hallucinations, vomiting and sweating    Current Outpatient Medications  Medication Sig Dispense Refill  . acetaminophen (TYLENOL) 500 MG tablet Take 500 mg by mouth every 6 (six) hours as needed for mild pain or moderate pain.    Marland Kitchen aspirin 81 MG tablet Take 81 mg by mouth daily as needed (for palpitations).     . hydrALAZINE (APRESOLINE) 25 MG tablet Take 25 mg by mouth 2 (two) times daily.  1  . LORazepam (ATIVAN) 0.5 MG tablet Take 0.5 mg by mouth daily as needed.    Marland Kitchen losartan (COZAAR) 100 MG tablet Take 100 mg by mouth daily.    Marland Kitchen olmesartan (BENICAR) 20 MG tablet Take 20 mg by mouth daily.  1  . simvastatin (ZOCOR) 10 MG tablet TAKE 1 TABLET BY MOUTH EVERY DAY AT NIGHT  0  . sodium chloride (OCEAN) 0.65 % SOLN nasal spray Place 1 spray into both nostrils every 4 (four) hours as needed for congestion.    . tacrolimus (PROGRAF) 1 MG capsule Take 1 mg by mouth 2 (two) times daily.    . verapamil (CALAN-SR) 240 MG CR tablet Take 240 mg by mouth daily.    Marland Kitchen lisinopril (PRINIVIL,ZESTRIL) 20 MG  tablet Take 20 mg by mouth daily.     No current facility-administered medications for this visit.    REVIEW OF SYSTEMS:  [X]  denotes positive finding, [ ]  denotes negative finding Cardiac  Comments:  Chest pain or chest pressure:    Shortness of breath upon exertion:    Short of breath when lying flat:    Irregular heart rhythm: x       Vascular    Pain in calf, thigh, or hip brought on by ambulation: x   Pain in feet at night that wakes you up from your sleep:     Blood clot in your veins:    Leg swelling:         Pulmonary    Oxygen at home:    Productive cough:     Wheezing:         Neurologic    Sudden weakness in arms or legs:     Sudden numbness in arms or legs:     Sudden onset of difficulty speaking or slurred speech:    Temporary loss of vision in one eye:     Problems with dizziness:         Gastrointestinal    Blood in stool:     Vomited blood:         Genitourinary     Burning when urinating:     Blood in urine:        Psychiatric    Major depression:         Hematologic    Bleeding problems:    Problems with blood clotting too easily:        Skin    Rashes or ulcers:        Constitutional    Fever or chills:     PHYSICAL EXAM:   Vitals:   07/03/19 1331 07/03/19 1334  BP: 132/86 132/80  Pulse: 78   Resp: 20   Temp: 97.6 F (36.4 C)   SpO2: 96%   Weight: 200 lb (90.7 kg)   Height: 5\' 4"  (1.626 m)     GENERAL: The patient is a well-nourished female, in no acute distress. The vital signs are documented above. CARDIAC: There is a regular rate and rhythm.  VASCULAR: I do not detect carotid bruits. She has palpable dorsalis pedis pulses bilaterally. PULMONARY: There is good air exchange bilaterally without wheezing or rales. ABDOMEN: Soft and non-tender with normal pitched bowel sounds.  MUSCULOSKELETAL: There are no major deformities or cyanosis. NEUROLOGIC: No focal weakness or paresthesias are detected. SKIN: There are no ulcers or rashes noted. PSYCHIATRIC: The patient has a normal affect.  DATA:    CAROTID DUPLEX: I have independently interpreted her carotid duplex scan today.  On the right side there is a 40 to 59% carotid stenosis.  The right vertebral artery is patent with antegrade flow.  On the left side there is a less than 39% stenosis.  The left vertebral artery is patent with antegrade flow.  MEDICAL ISSUES:   RIGHT CAROTID STENOSIS: This patient has an asymptomatic 40 to 59% right carotid stenosis which is stable. She understands we would not consider carotid endarterectomy was the stenosis progressed to greater than 80% or she develop new right hemispheric symptoms. She is on aspirin and is on a statin. She is not a smoker. I encouraged her to stay as active as possible. The only thing holding her back is her back symptoms. I have ordered a follow-up carotid duplex scan  in 1 year and I will see her back at that time.  She knows to call sooner if she has problems.  Deitra Mayo [facility] [phone]

## 2019-07-04 ENCOUNTER — Other Ambulatory Visit: Payer: Self-pay | Admitting: *Deleted

## 2019-07-04 DIAGNOSIS — I6523 Occlusion and stenosis of bilateral carotid arteries: Secondary | ICD-10-CM

## 2019-07-12 DIAGNOSIS — Z79899 Other long term (current) drug therapy: Secondary | ICD-10-CM | POA: Diagnosis not present

## 2019-07-12 DIAGNOSIS — Z944 Liver transplant status: Secondary | ICD-10-CM | POA: Diagnosis not present

## 2019-07-12 LAB — COMPREHENSIVE METABOLIC PANEL
ALKALINE PHOSPHATASE: 76 U/L
ALT (SGPT): 24 U/L
AST (SGOT): 29 U/L
BILIRUBIN TOTAL: 0.4 mg/dL
BLOOD UREA NITROGEN: 28 mg/dL — ABNORMAL HIGH
CALCIUM: 9.2 mg/dL
CHLORIDE: 104 mmol/L
CO2: 26.2 mmol/L
CREATININE: 1.11 mg/dL
EGFR CKD-EPI AA FEMALE: 58 mL/min/{1.73_m2} — ABNORMAL LOW
EGFR CKD-EPI NON-AA FEMALE: 48 mL/min/{1.73_m2} — ABNORMAL LOW
POTASSIUM: 4.8 mmol/L
PROTEIN TOTAL: 7 g/dL

## 2019-07-12 LAB — PHOSPHORUS
Lab: 3.5
PHOSPHORUS: 3.5 mg/dL

## 2019-07-12 LAB — CBC W/ DIFFERENTIAL
BASOPHILS ABSOLUTE COUNT: 0.1 10*9/L
EOSINOPHILS ABSOLUTE COUNT: 0.3 10*9/L
HEMATOCRIT: 43 %
HEMOGLOBIN: 13.3 g/dL
LYMPHOCYTES ABSOLUTE COUNT: 2.2 10*9/L
MONOCYTES ABSOLUTE COUNT: 0.7 10*9/L
NEUTROPHILS ABSOLUTE COUNT: 4.8 10*9/L
PLATELET COUNT: 253 10*9/L
WBC ADJUSTED: 8 10*9/L

## 2019-07-12 LAB — ALBUMIN: Lab: 4.5

## 2019-07-12 LAB — BILIRUBIN DIRECT: Lab: 0.1

## 2019-07-12 LAB — PLATELET COUNT: Lab: 253

## 2019-07-12 LAB — MAGNESIUM: Lab: 2

## 2019-07-12 LAB — GAMMA GLUTAMYL TRANSFERASE: Lab: 40

## 2019-07-12 LAB — CO2: Lab: 26.2

## 2019-07-12 MED FILL — PROGRAF 1 MG CAPSULE: 30 days supply | Qty: 60 | Fill #1 | Status: AC

## 2019-07-12 MED FILL — PROGRAF 1 MG CAPSULE: ORAL | 30 days supply | Qty: 60 | Fill #1

## 2019-07-12 NOTE — Unmapped (Signed)
[facility] Specialty Pharmacy Refill Coordination Note    Specialty Medication(s) to be Shipped:   Transplant: Prograf 1mg     Other medication(s) to be shipped: N/A     Pixie Casino, DOB: 1940-06-07  Phone: [phone] (home) [phone] (work)      All above HIPAA information was verified with patient.     Was a Nurse, learning disability used for this call? No    Completed refill call assessment today to schedule patient's medication shipment from the [facility] [phone]).       Specialty medication(s) and dose(s) confirmed: Regimen is correct and unchanged.   Changes to medications: Ramia reports no changes at this time.  Changes to insurance: No  Questions for the pharmacist: No    Confirmed patient received Welcome Packet with first shipment. The patient will receive a drug information handout for each medication shipped and additional FDA Medication Guides as required.       DISEASE/MEDICATION-SPECIFIC INFORMATION        N/A    SPECIALTY MEDICATION ADHERENCE     Medication Adherence    Patient reported X missed doses in the last month: 0  Specialty Medication: Prograf 1mg   Patient is on additional specialty medications: No  Adherence tools used: patient uses a pill box to manage medications  Support network for adherence: family member          Prograf 1 mg: 7 days of medicine on hand     SHIPPING     Shipping address confirmed in Epic.     Delivery Scheduled: Yes, Expected medication delivery date: 07/15/2019.     Medication will be delivered via UPS to the prescription address in Epic WAM.    Lorelei Pont [facility] Specialty Technician

## 2019-07-12 NOTE — Unmapped (Signed)
[facility] Specialty Pharmacy Refill Coordination Note  Medication: Prograf    Unable to reach patient to schedule shipment for medication being filled at [facility]. Left voicemail on phone.  As this is the 3rd unsuccessful attempt to reach the patient, no additional phone call attempts will be made at this time.      Phone numbers attempted: [phone], [phone]  Last scheduled delivery: 06/10/19    Please call the [facility] at [phone] (option 4) should you have any further questions.      Thanks,  [facility] Specialty Team

## 2019-07-15 LAB — TACROLIMUS, TROUGH: Lab: 4.5

## 2019-07-22 DIAGNOSIS — E782 Mixed hyperlipidemia: Secondary | ICD-10-CM | POA: Diagnosis not present

## 2019-07-22 DIAGNOSIS — R7301 Impaired fasting glucose: Secondary | ICD-10-CM | POA: Diagnosis not present

## 2019-07-22 DIAGNOSIS — G8929 Other chronic pain: Secondary | ICD-10-CM | POA: Diagnosis not present

## 2019-07-22 DIAGNOSIS — Z944 Liver transplant status: Secondary | ICD-10-CM | POA: Diagnosis not present

## 2019-07-22 DIAGNOSIS — E7849 Other hyperlipidemia: Secondary | ICD-10-CM | POA: Diagnosis not present

## 2019-07-22 DIAGNOSIS — K580 Irritable bowel syndrome with diarrhea: Secondary | ICD-10-CM | POA: Diagnosis not present

## 2019-07-22 DIAGNOSIS — I10 Essential (primary) hypertension: Secondary | ICD-10-CM | POA: Diagnosis not present

## 2019-07-22 DIAGNOSIS — F419 Anxiety disorder, unspecified: Secondary | ICD-10-CM | POA: Diagnosis not present

## 2019-08-06 DIAGNOSIS — F419 Anxiety disorder, unspecified: Secondary | ICD-10-CM | POA: Diagnosis not present

## 2019-08-06 DIAGNOSIS — I10 Essential (primary) hypertension: Secondary | ICD-10-CM | POA: Diagnosis not present

## 2019-08-06 DIAGNOSIS — E7849 Other hyperlipidemia: Secondary | ICD-10-CM | POA: Diagnosis not present

## 2019-08-06 DIAGNOSIS — K580 Irritable bowel syndrome with diarrhea: Secondary | ICD-10-CM | POA: Diagnosis not present

## 2019-08-06 DIAGNOSIS — R7301 Impaired fasting glucose: Secondary | ICD-10-CM | POA: Diagnosis not present

## 2019-08-06 DIAGNOSIS — E782 Mixed hyperlipidemia: Secondary | ICD-10-CM | POA: Diagnosis not present

## 2019-08-06 DIAGNOSIS — G8929 Other chronic pain: Secondary | ICD-10-CM | POA: Diagnosis not present

## 2019-08-06 DIAGNOSIS — Z944 Liver transplant status: Secondary | ICD-10-CM | POA: Diagnosis not present

## 2019-08-09 NOTE — Unmapped (Signed)
[facility] Specialty Pharmacy Refill Coordination Note    Specialty Medication(s) to be Shipped:   Transplant: Prograf 1mg     Other medication(s) to be shipped: N/A     Tamara Morris, DOB: 28-Nov-1940  Phone: [phone] (home) [phone] (work)      All above HIPAA information was verified with patient.     Was a Nurse, learning disability used for this call? No    Completed refill call assessment today to schedule patient's medication shipment from the [facility] [phone]).       Specialty medication(s) and dose(s) confirmed: Regimen is correct and unchanged.   Changes to medications: Kmya reports no changes at this time.  Changes to insurance: No  Questions for the pharmacist: No    Confirmed patient received Welcome Packet with first shipment. The patient will receive a drug information handout for each medication shipped and additional FDA Medication Guides as required.       DISEASE/MEDICATION-SPECIFIC INFORMATION        N/A    SPECIALTY MEDICATION ADHERENCE     Medication Adherence    Patient reported X missed doses in the last month: 0  Specialty Medication: Prograf 1mg   Patient is on additional specialty medications: No  Adherence tools used: patient uses a pill box to manage medications  Support network for adherence: family member          Prograf 1 mg: 7 days of medicine on hand     SHIPPING     Shipping address confirmed in Epic.     Delivery Scheduled: Yes, Expected medication delivery date: 08/15/2019.     Medication will be delivered via UPS to the prescription address in Epic WAM.    Lorelei Pont [facility] Specialty Technician

## 2019-08-14 MED FILL — PROGRAF 1 MG CAPSULE: ORAL | 30 days supply | Qty: 60 | Fill #2

## 2019-08-14 MED FILL — PROGRAF 1 MG CAPSULE: 30 days supply | Qty: 60 | Fill #2 | Status: AC

## 2019-08-16 DIAGNOSIS — Z944 Liver transplant status: Secondary | ICD-10-CM | POA: Diagnosis not present

## 2019-08-16 DIAGNOSIS — Z79899 Other long term (current) drug therapy: Secondary | ICD-10-CM | POA: Diagnosis not present

## 2019-08-16 LAB — CBC W/ DIFFERENTIAL
HEMATOCRIT: 42.3 %
HEMOGLOBIN: 13.3 g/dL
LYMPHOCYTES ABSOLUTE COUNT: 2.2 10*9/L
MONOCYTES ABSOLUTE COUNT: 0.8 10*9/L
NEUTROPHILS ABSOLUTE COUNT: 6.3 10*9/L
PLATELET COUNT: 258 10*9/L
WBC ADJUSTED: 9.6 10*9/L

## 2019-08-16 LAB — BILIRUBIN TOTAL: Lab: 0.4

## 2019-08-16 LAB — MAGNESIUM: Lab: 1.8

## 2019-08-16 LAB — COMPREHENSIVE METABOLIC PANEL
ALKALINE PHOSPHATASE: 82 U/L
ALT (SGPT): 25 U/L
AST (SGOT): 26 U/L
BILIRUBIN TOTAL: 0.4 mg/dL
BLOOD UREA NITROGEN: 25 mg/dL — ABNORMAL HIGH
CHLORIDE: 103 mmol/L
CO2: 27.5 mmol/L
CREATININE: 1 mg/dL
GLUCOSE RANDOM: 104 mg/dL
POTASSIUM: 4.4 mmol/L
PROTEIN TOTAL: 7 g/dL
SODIUM: 140 mmol/L

## 2019-08-16 LAB — ALBUMIN: Lab: 4.4

## 2019-08-16 LAB — BILIRUBIN DIRECT: Lab: 0.1

## 2019-08-16 LAB — PHOSPHORUS: Lab: 3.5

## 2019-08-16 LAB — GAMMA GLUTAMYL TRANSFERASE: Lab: 44

## 2019-08-16 LAB — MONOCYTES RELATIVE PERCENT: Lab: 0

## 2019-08-19 LAB — TACROLIMUS, TROUGH: Lab: 3.9

## 2019-09-10 NOTE — Unmapped (Signed)
[facility] Specialty Pharmacy Refill Coordination Note    Specialty Medication(s) to be Shipped:   Transplant: Prograf 1mg     Other medication(s) to be shipped: N/A     Tamara Morris, DOB: 04/03/1941  Phone: [phone] (home) [phone] (work)      All above HIPAA information was verified with patient.     Was a Nurse, learning disability used for this call? No    Completed refill call assessment today to schedule patient's medication shipment from the [facility] [phone]).       Specialty medication(s) and dose(s) confirmed: Regimen is correct and unchanged.   Changes to medications: Zandrea reports no changes at this time.  Changes to insurance: No  Questions for the pharmacist: No    Confirmed patient received Welcome Packet with first shipment. The patient will receive a drug information handout for each medication shipped and additional FDA Medication Guides as required.       DISEASE/MEDICATION-SPECIFIC INFORMATION        N/A    SPECIALTY MEDICATION ADHERENCE     Medication Adherence    Patient reported X missed doses in the last month: 0  Specialty Medication: Prograf 1mg   Patient is on additional specialty medications: No  Adherence tools used: patient uses a pill box to manage medications  Support network for adherence: family member        Prograf 1 mg: 7 days of medicine on hand     SHIPPING     Shipping address confirmed in Epic.     Delivery Scheduled: Yes, Expected medication delivery date: 09/13/2019.     Medication will be delivered via UPS to the prescription address in Epic WAM.    Lorelei Pont [facility] Specialty Technician

## 2019-09-12 MED FILL — PROGRAF 1 MG CAPSULE: 30 days supply | Qty: 60 | Fill #3 | Status: AC

## 2019-09-12 MED FILL — PROGRAF 1 MG CAPSULE: ORAL | 30 days supply | Qty: 60 | Fill #3

## 2019-09-20 ENCOUNTER — Ambulatory Visit: Admit: 2019-09-20 | Discharge: 2019-09-20 | Payer: MEDICARE

## 2019-09-20 DIAGNOSIS — Z944 Liver transplant status: Principal | ICD-10-CM

## 2019-09-20 LAB — CBC W/ AUTO DIFF
BASOPHILS ABSOLUTE COUNT: 0.1 10*9/L (ref 0.0–0.2)
BASOPHILS RELATIVE PERCENT: 0.8 %
EOSINOPHILS ABSOLUTE COUNT: 0.4 10*9/L (ref 0.0–0.4)
EOSINOPHILS RELATIVE PERCENT: 4 %
HEMATOCRIT: 43.2 % (ref 34.0–44.0)
HEMOGLOBIN: 13.5 g/dL (ref 11.5–15.0)
LYMPHOCYTES ABSOLUTE COUNT: 2.3 10*9/L (ref 0.7–4.5)
LYMPHOCYTES RELATIVE PERCENT: 26.6 %
MEAN CORPUSCULAR HEMOGLOBIN CONC: 31.3 g/dL — ABNORMAL LOW (ref 32.0–36.0)
MEAN CORPUSCULAR HEMOGLOBIN: 30.5 pg (ref 27.0–34.0)
MEAN CORPUSCULAR VOLUME: 97.7 fL (ref 80.0–98.0)
MEAN PLATELET VOLUME: 9.9 fL (ref 7.4–10.4)
MONOCYTES ABSOLUTE COUNT: 0.8 10*9/L (ref 0.1–1.0)
MONOCYTES RELATIVE PERCENT: 8.9 %
NEUTROPHILS ABSOLUTE COUNT: 5.1 10*9/L (ref 1.8–7.8)
NEUTROPHILS RELATIVE PERCENT: 59.4 %
RED BLOOD CELL COUNT: 4.42 10*12/L (ref 3.80–5.10)
RED CELL DISTRIBUTION WIDTH: 13.7 % (ref 11.5–14.5)
WBC ADJUSTED: 8.7 10*9/L (ref 4.0–10.5)

## 2019-09-20 LAB — COMPREHENSIVE METABOLIC PANEL
ALBUMIN: 4.2 g/dL (ref 3.5–5.0)
ALKALINE PHOSPHATASE: 72 U/L (ref 46–116)
ALT (SGPT): 37 U/L (ref 12–78)
ANION GAP: 7 mmol/L (ref 3–11)
AST (SGOT): 22 U/L (ref 15–40)
BLOOD UREA NITROGEN: 27 mg/dL — ABNORMAL HIGH (ref 8–20)
BUN / CREAT RATIO: 22
CHLORIDE: 108 mmol/L — ABNORMAL HIGH (ref 98–107)
CO2: 27.2 mmol/L (ref 21.0–32.0)
CREATININE: 1.21 mg/dL — ABNORMAL HIGH (ref 0.60–1.10)
EGFR CKD-EPI NON-AA FEMALE: 43 mL/min/{1.73_m2}
GLUCOSE RANDOM: 103 mg/dL — ABNORMAL HIGH (ref 70–99)
POTASSIUM: 4.8 mmol/L (ref 3.5–5.0)
PROTEIN TOTAL: 7.1 g/dL (ref 6.0–8.0)
SODIUM: 142 mmol/L (ref 135–145)

## 2019-09-20 LAB — PHOSPHORUS: Phosphate:MCnc:Pt:Ser/Plas:Qn:: 3.9

## 2019-09-20 LAB — RED CELL DISTRIBUTION WIDTH: Lab: 13.7

## 2019-09-20 LAB — BILIRUBIN DIRECT: Bilirubin.glucuronidated+Bilirubin.albumin bound:MCnc:Pt:Ser/Plas:Qn:: 0.17

## 2019-09-20 LAB — MAGNESIUM: Magnesium:MCnc:Pt:Ser/Plas:Qn:: 2

## 2019-09-20 LAB — GAMMA GLUTAMYL TRANSFERASE: Gamma glutamyl transferase:CCnc:Pt:Ser/Plas:Qn:: 48 — ABNORMAL HIGH

## 2019-09-20 LAB — ALBUMIN: Albumin:MCnc:Pt:Ser/Plas:Qn:: 4.2

## 2019-09-20 NOTE — Unmapped (Signed)
Faxed lab order to [facility] @ [phone] through Epic as requested by pt.

## 2019-09-21 LAB — TACROLIMUS BLOOD: Lab: 3.5

## 2019-09-23 NOTE — Unmapped (Signed)
Received vm from pt requesting return call to discuss previously left msg. Called pt and explained that creatinine is slightly elevated and encouraged hydration. Pt stated she has been drinking about 60 oz of fluid daily. Encouraged her to drink at least 80 oz daily. Pt verbalized understanding.

## 2019-09-23 NOTE — Unmapped (Signed)
Called pt and left vm informing her lab results look great except creatinine is slightly elevated. Encouraged pt to hydrate.

## 2019-10-01 DIAGNOSIS — Z944 Liver transplant status: Principal | ICD-10-CM

## 2019-10-10 NOTE — Unmapped (Signed)
[facility] Specialty Pharmacy Clinical Assessment & Refill Coordination Note    Tamara Morris, DOB: 06-Jun-1940  Phone: [phone] (home) [phone] (work)    All above HIPAA information was verified with patient.     Was a Nurse, learning disability used for this call? No    Specialty Medication(s):   Transplant: Prograf 1mg      Current Outpatient Medications   Medication Sig Dispense Refill   ??? acetaminophen (TYLENOL) 325 MG tablet Take 500 mg by mouth every six (6) hours as needed for pain.      ??? aspirin (ECOTRIN) 81 MG tablet Take 81 mg by mouth daily.     ??? hydrALAZINE (APRESOLINE) 25 MG tablet Take 25 mg by mouth Two (2) times a day.     ??? loperamide (IMODIUM A-D) 2 mg tablet Take 2 mg by mouth. Frequency:PRN   Dosage:2   MG  Instructions:  Note:Dose: 2MG      ??? LORazepam (ATIVAN) 0.5 MG tablet Take 0.5 mg by mouth. Frequency:PRN   Dosage:0.5   MG  Instructions:  Note:Dose: 0.5MG      ??? losartan (COZAAR) 100 MG tablet Take 100 mg by mouth daily.     ??? olmesartan (BENICAR) 20 MG tablet Take 20 mg by mouth daily.     ??? PROGRAF 1 mg capsule Take 1 capsule by mouth twice daily 60 capsule 11   ??? simvastatin (ZOCOR) 10 MG tablet Take 10 mg by mouth nightly.     ??? verapamil (CALAN-SR) 240 MG CR tablet        No current facility-administered medications for this visit.        Changes to medications: Tamara Morris reports no changes at this time.    Allergies   Allergen Reactions   ??? Codeine Other (See Comments)   ??? Hydromorphone Other (See Comments)   ??? Meperidine Other (See Comments)       Changes to allergies: No    SPECIALTY MEDICATION ADHERENCE     Prograf 1mg   : 6 days of medicine on hand     Medication Adherence    Patient reported X missed doses in the last month: 0  Specialty Medication: prograf 1mg   Adherence tools used: patient uses a pill box to manage medications  Support network for adherence: family member          Specialty medication(s) dose(s) confirmed: Regimen is correct and unchanged.     Are there any concerns with adherence? No    Adherence counseling provided? Not needed    CLINICAL MANAGEMENT AND INTERVENTION      Clinical Benefit Assessment:    Do you feel the medicine is effective or helping your condition? Yes    Clinical Benefit counseling provided? Not needed    Adverse Effects Assessment:    Are you experiencing any side effects? No    Are you experiencing difficulty administering your medicine? No    Quality of Life Assessment:    How many days over the past month did your transplant  keep you from your normal activities? For example, brushing your teeth or getting up in the morning. 0    Have you discussed this with your provider? Not needed    Therapy Appropriateness:    Is therapy appropriate? Yes, therapy is appropriate and should be continued    DISEASE/MEDICATION-SPECIFIC INFORMATION      N/A    PATIENT SPECIFIC NEEDS     - Does the patient have any physical, cognitive, or cultural barriers?  No    - Is the patient high risk? No     - Does the patient require a Care Management Plan? No     - Does the patient require physician intervention or other additional services (i.e. nutrition, smoking cessation, social work)? No      SHIPPING     Specialty Medication(s) to be Shipped:   Transplant: Prograf 1mg     Other medication(s) to be shipped: na     Changes to insurance: No    Delivery Scheduled: Yes, Expected medication delivery date: 10/14/2019.     Medication will be delivered via UPS to the confirmed prescription address in [facility].    The patient will receive a drug information handout for each medication shipped and additional FDA Medication Guides as required.  Verified that patient has previously received a Conservation officer, historic buildings.    All of the patient's questions and concerns have been addressed.    Thad Ranger   [facility] Specialty Pharmacist

## 2019-10-11 MED FILL — PROGRAF 1 MG CAPSULE: ORAL | 30 days supply | Qty: 60 | Fill #4

## 2019-10-11 MED FILL — PROGRAF 1 MG CAPSULE: 30 days supply | Qty: 60 | Fill #4 | Status: AC

## 2019-10-23 DIAGNOSIS — E782 Mixed hyperlipidemia: Secondary | ICD-10-CM | POA: Diagnosis not present

## 2019-10-23 DIAGNOSIS — F419 Anxiety disorder, unspecified: Secondary | ICD-10-CM | POA: Diagnosis not present

## 2019-10-23 DIAGNOSIS — R7301 Impaired fasting glucose: Secondary | ICD-10-CM | POA: Diagnosis not present

## 2019-10-23 DIAGNOSIS — Z944 Liver transplant status: Secondary | ICD-10-CM | POA: Diagnosis not present

## 2019-10-23 DIAGNOSIS — G8929 Other chronic pain: Secondary | ICD-10-CM | POA: Diagnosis not present

## 2019-10-23 DIAGNOSIS — K580 Irritable bowel syndrome with diarrhea: Secondary | ICD-10-CM | POA: Diagnosis not present

## 2019-10-23 DIAGNOSIS — E7849 Other hyperlipidemia: Secondary | ICD-10-CM | POA: Diagnosis not present

## 2019-10-23 DIAGNOSIS — I10 Essential (primary) hypertension: Secondary | ICD-10-CM | POA: Diagnosis not present

## 2019-10-25 ENCOUNTER — Ambulatory Visit: Admit: 2019-10-25 | Discharge: 2019-10-26 | Payer: MEDICARE

## 2019-10-25 DIAGNOSIS — Z79899 Other long term (current) drug therapy: Secondary | ICD-10-CM | POA: Diagnosis not present

## 2019-10-25 DIAGNOSIS — Z944 Liver transplant status: Secondary | ICD-10-CM | POA: Diagnosis not present

## 2019-10-25 LAB — COMPREHENSIVE METABOLIC PANEL
ALBUMIN: 4.1 g/dL (ref 3.5–5.0)
ALKALINE PHOSPHATASE: 77 U/L (ref 46–116)
ALT (SGPT): 33 U/L (ref 12–78)
ANION GAP: 5 mmol/L (ref 3–11)
AST (SGOT): 23 U/L (ref 15–40)
BILIRUBIN TOTAL: 0.5 mg/dL (ref 0.3–1.2)
BLOOD UREA NITROGEN: 28 mg/dL — ABNORMAL HIGH (ref 8–20)
BUN / CREAT RATIO: 24
CALCIUM: 9.1 mg/dL (ref 8.5–10.1)
CHLORIDE: 106 mmol/L (ref 98–107)
CO2: 31.4 mmol/L (ref 21.0–32.0)
CREATININE: 1.16 mg/dL — ABNORMAL HIGH (ref 0.60–1.10)
EGFR CKD-EPI NON-AA FEMALE: 45 mL/min/{1.73_m2}
GLUCOSE RANDOM: 109 mg/dL (ref 70–179)
POTASSIUM: 5 mmol/L (ref 3.5–5.0)
PROTEIN TOTAL: 7.1 g/dL (ref 6.0–8.0)
SODIUM: 142 mmol/L (ref 135–145)

## 2019-10-25 LAB — CBC W/ AUTO DIFF
BASOPHILS ABSOLUTE COUNT: 0.1 10*9/L (ref 0.0–0.2)
BASOPHILS RELATIVE PERCENT: 0.7 %
EOSINOPHILS ABSOLUTE COUNT: 0.3 10*9/L (ref 0.0–0.4)
EOSINOPHILS RELATIVE PERCENT: 3.7 %
HEMATOCRIT: 39.5 % (ref 34.0–44.0)
LYMPHOCYTES ABSOLUTE COUNT: 2.1 10*9/L (ref 0.7–4.5)
MEAN CORPUSCULAR HEMOGLOBIN CONC: 31.1 g/dL — ABNORMAL LOW (ref 32.0–36.0)
MEAN CORPUSCULAR HEMOGLOBIN: 31.1 pg (ref 27.0–34.0)
MEAN CORPUSCULAR VOLUME: 99.7 fL — ABNORMAL HIGH (ref 80.0–98.0)
MEAN PLATELET VOLUME: 10.2 fL (ref 7.4–10.4)
MONOCYTES RELATIVE PERCENT: 10 %
NEUTROPHILS ABSOLUTE COUNT: 5.3 10*9/L (ref 1.8–7.8)
NEUTROPHILS RELATIVE PERCENT: 61.4 %
PLATELET COUNT: 264 10*9/L (ref 140–415)
RED BLOOD CELL COUNT: 3.96 10*12/L (ref 3.80–5.10)
RED CELL DISTRIBUTION WIDTH: 13.3 % (ref 11.5–14.5)
WBC ADJUSTED: 8.7 10*9/L (ref 4.0–10.5)

## 2019-10-25 LAB — MEAN CORPUSCULAR HEMOGLOBIN CONC: Erythrocyte mean corpuscular hemoglobin concentration:MCnc:Pt:RBC:Qn:Automated count: 31.1 — ABNORMAL LOW

## 2019-10-25 LAB — BILIRUBIN TOTAL: Bilirubin:MCnc:Pt:Ser/Plas:Qn:: 0.5

## 2019-10-25 LAB — MAGNESIUM: Magnesium:MCnc:Pt:Ser/Plas:Qn:: 2

## 2019-10-25 LAB — PHOSPHORUS: Phosphate:MCnc:Pt:Ser/Plas:Qn:: 3.7

## 2019-10-25 LAB — GAMMA GLUTAMYL TRANSFERASE: Gamma glutamyl transferase:CCnc:Pt:Ser/Plas:Qn:: 54 — ABNORMAL HIGH

## 2019-10-25 LAB — BILIRUBIN DIRECT: Bilirubin.glucuronidated+Bilirubin.albumin bound:MCnc:Pt:Ser/Plas:Qn:: 0.15

## 2019-10-26 LAB — TACROLIMUS, TROUGH: Lab: 2.4 — ABNORMAL LOW

## 2019-11-03 IMAGING — DX DG CHEST 2V
2 series · 2 of 2 positions shown · non-contrast
Comparison: X 03/30/2015

CLINICAL DATA: Cough for 2 weeks

EXAM:
CHEST - 2 VIEW

[chest lat]
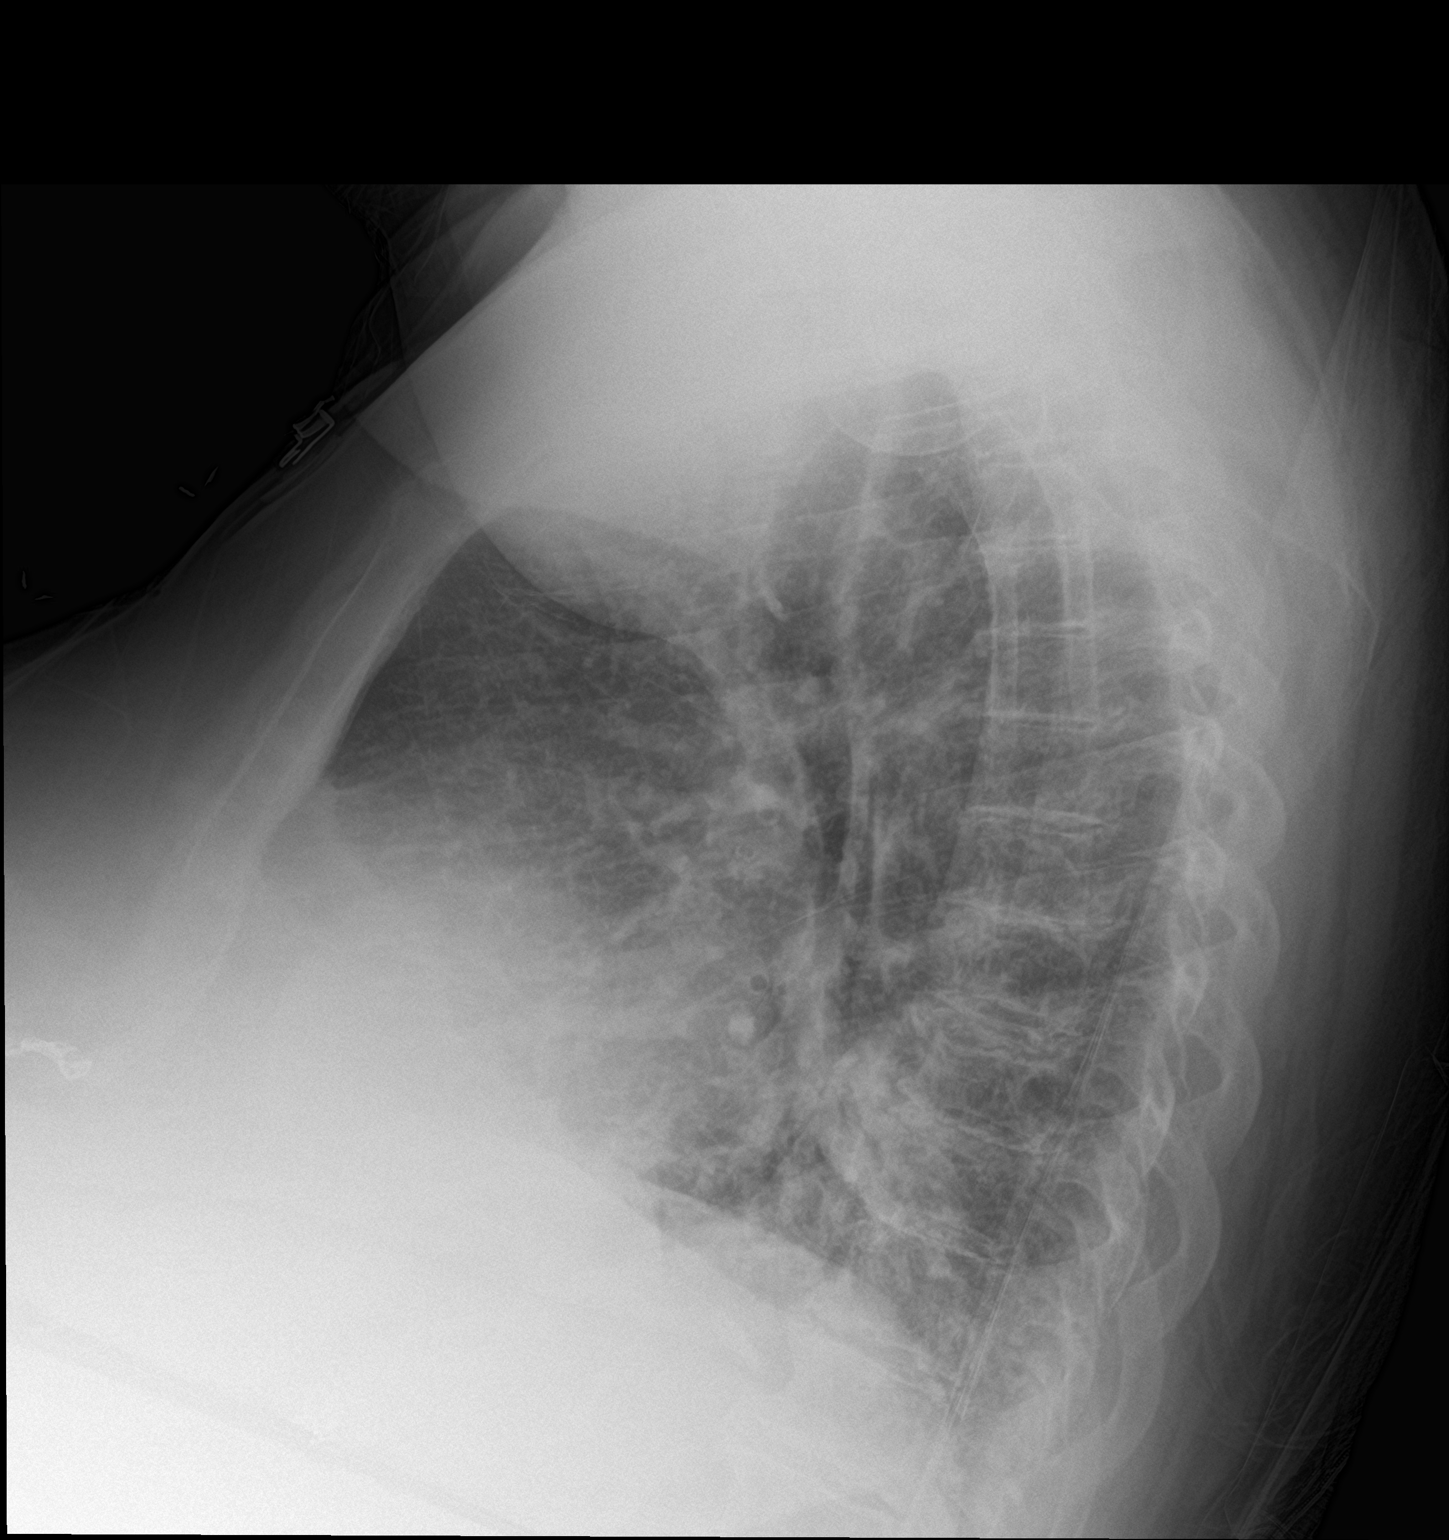

[chest ap]
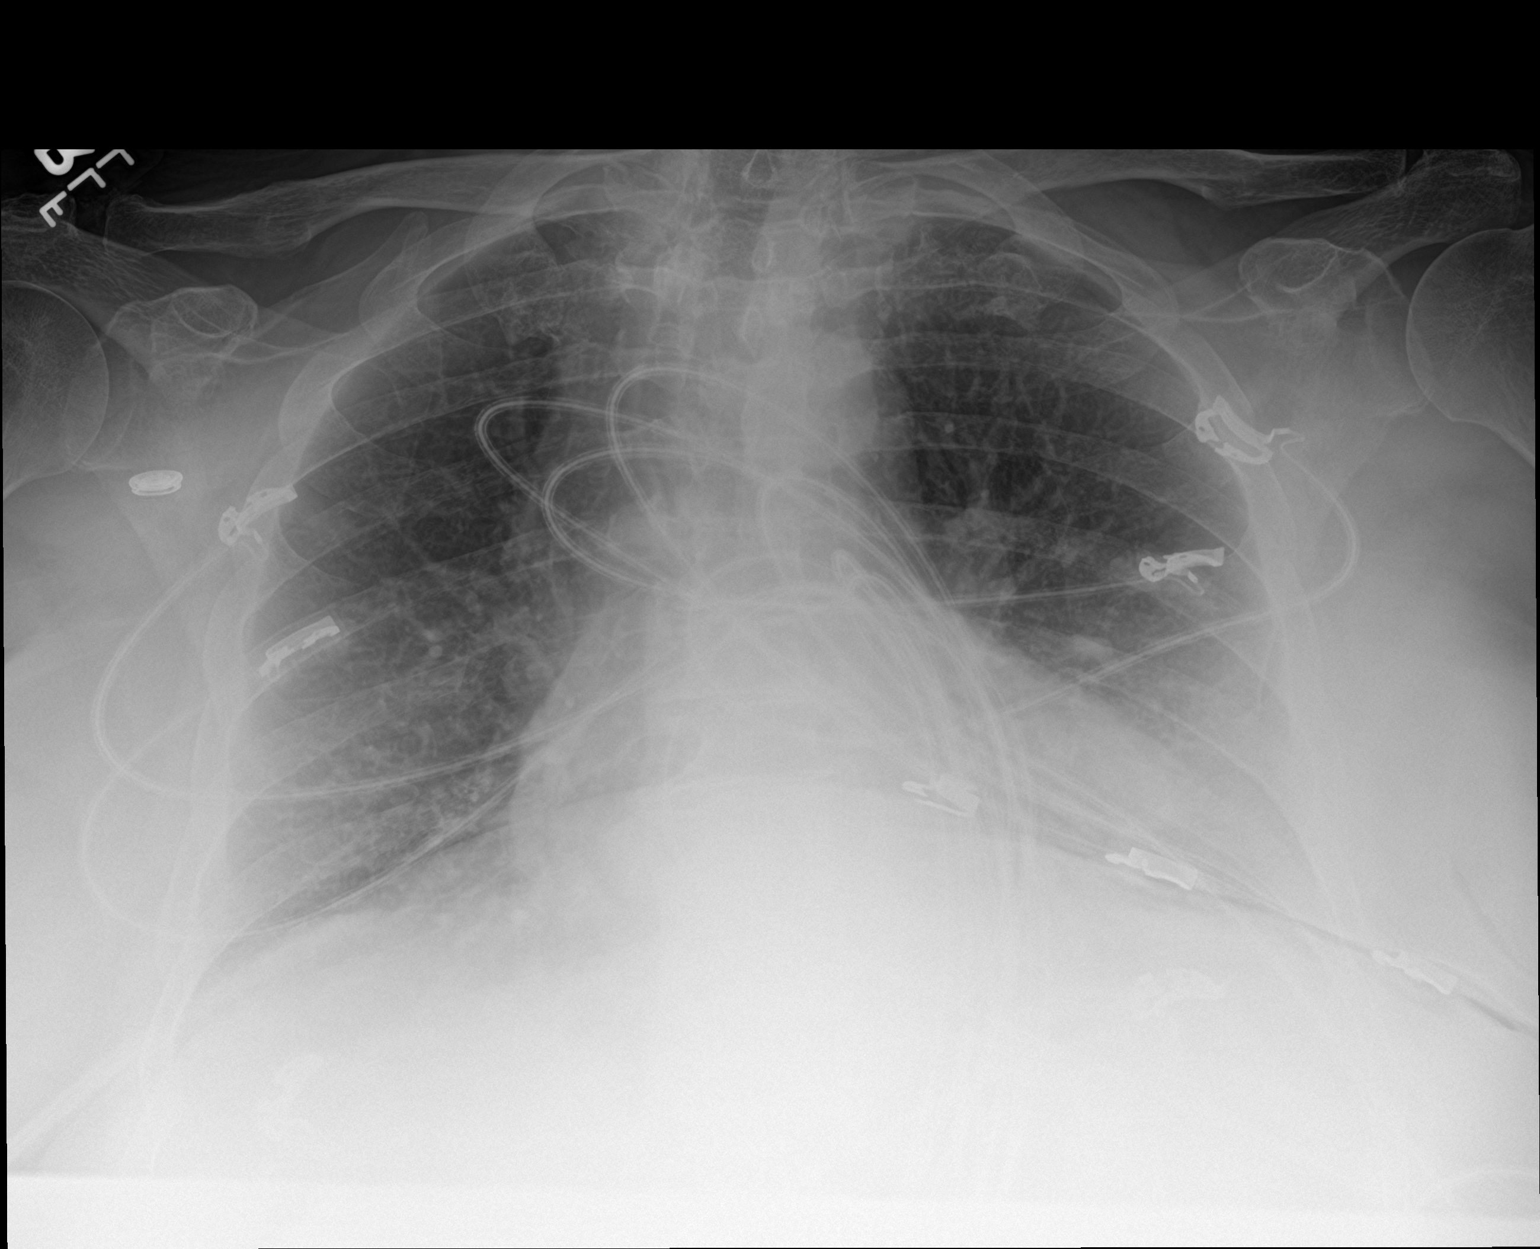

[2 of 2 positions shown; findings below may reference images not displayed]

FINDINGS: Low volume chest with interstitial coarsening at the bases. There is
a opacity over the lower spine in the lateral projection that is not
clearly localized on the frontal view. Cardiomegaly accentuated by
low volumes. No Kerley lines, effusion, or pneumothorax.

Artifact from EKG leads
IMPRESSION: Interstitial coarsening, favored bronchitic. Pneumonia is not
excluded based on the lateral view, suggest follow-up radiograph.

## 2019-11-04 DIAGNOSIS — I10 Essential (primary) hypertension: Secondary | ICD-10-CM | POA: Diagnosis not present

## 2019-11-04 DIAGNOSIS — E782 Mixed hyperlipidemia: Secondary | ICD-10-CM | POA: Diagnosis not present

## 2019-11-04 DIAGNOSIS — K580 Irritable bowel syndrome with diarrhea: Secondary | ICD-10-CM | POA: Diagnosis not present

## 2019-11-04 DIAGNOSIS — R7301 Impaired fasting glucose: Secondary | ICD-10-CM | POA: Diagnosis not present

## 2019-11-04 DIAGNOSIS — E7849 Other hyperlipidemia: Secondary | ICD-10-CM | POA: Diagnosis not present

## 2019-11-04 DIAGNOSIS — Z944 Liver transplant status: Secondary | ICD-10-CM | POA: Diagnosis not present

## 2019-11-04 DIAGNOSIS — F419 Anxiety disorder, unspecified: Secondary | ICD-10-CM | POA: Diagnosis not present

## 2019-11-04 DIAGNOSIS — G8929 Other chronic pain: Secondary | ICD-10-CM | POA: Diagnosis not present

## 2019-11-06 NOTE — Unmapped (Signed)
[facility] Specialty Pharmacy Refill Coordination Note    Specialty Medication(s) to be Shipped:   Transplant: Prograf 1mg     Other medication(s) to be shipped: No additional medications requested for fill at this time     Tamara Morris, DOB: 1940/06/20  Phone: [phone] (home) [phone] (work)      All above HIPAA information was verified with patient.     Was a Nurse, learning disability used for this call? No    Completed refill call assessment today to schedule patient's medication shipment from the [facility] [phone]).       Specialty medication(s) and dose(s) confirmed: Regimen is correct and unchanged.   Changes to medications: Fenna reports no changes at this time.  Changes to insurance: No  Questions for the pharmacist: No    Confirmed patient received Welcome Packet with first shipment. The patient will receive a drug information handout for each medication shipped and additional FDA Medication Guides as required.       DISEASE/MEDICATION-SPECIFIC INFORMATION        N/A    SPECIALTY MEDICATION ADHERENCE     Medication Adherence    Patient reported X missed doses in the last month: 0  Specialty Medication: Prograf 1mg   Patient is on additional specialty medications: No  Adherence tools used: patient uses a pill box to manage medications  Support network for adherence: family member                Prograf 1 mg: 7 days of medicine on hand          SHIPPING     Shipping address confirmed in Epic.     Delivery Scheduled: Yes, Expected medication delivery date: 11/12/19.     Medication will be delivered via UPS to the prescription address in Epic WAM.    Nancy Nordmann [facility] Specialty Technician

## 2019-11-11 MED FILL — PROGRAF 1 MG CAPSULE: 30 days supply | Qty: 60 | Fill #5 | Status: AC

## 2019-11-11 MED FILL — PROGRAF 1 MG CAPSULE: ORAL | 30 days supply | Qty: 60 | Fill #5

## 2019-11-17 IMAGING — DX DG CHEST 2V
2 series · 2 of 2 positions shown · non-contrast
Comparison: 04/02/2018.

CLINICAL DATA: Cough.  Bronchitis.

EXAM:
CHEST - 2 VIEW

[chest pa]
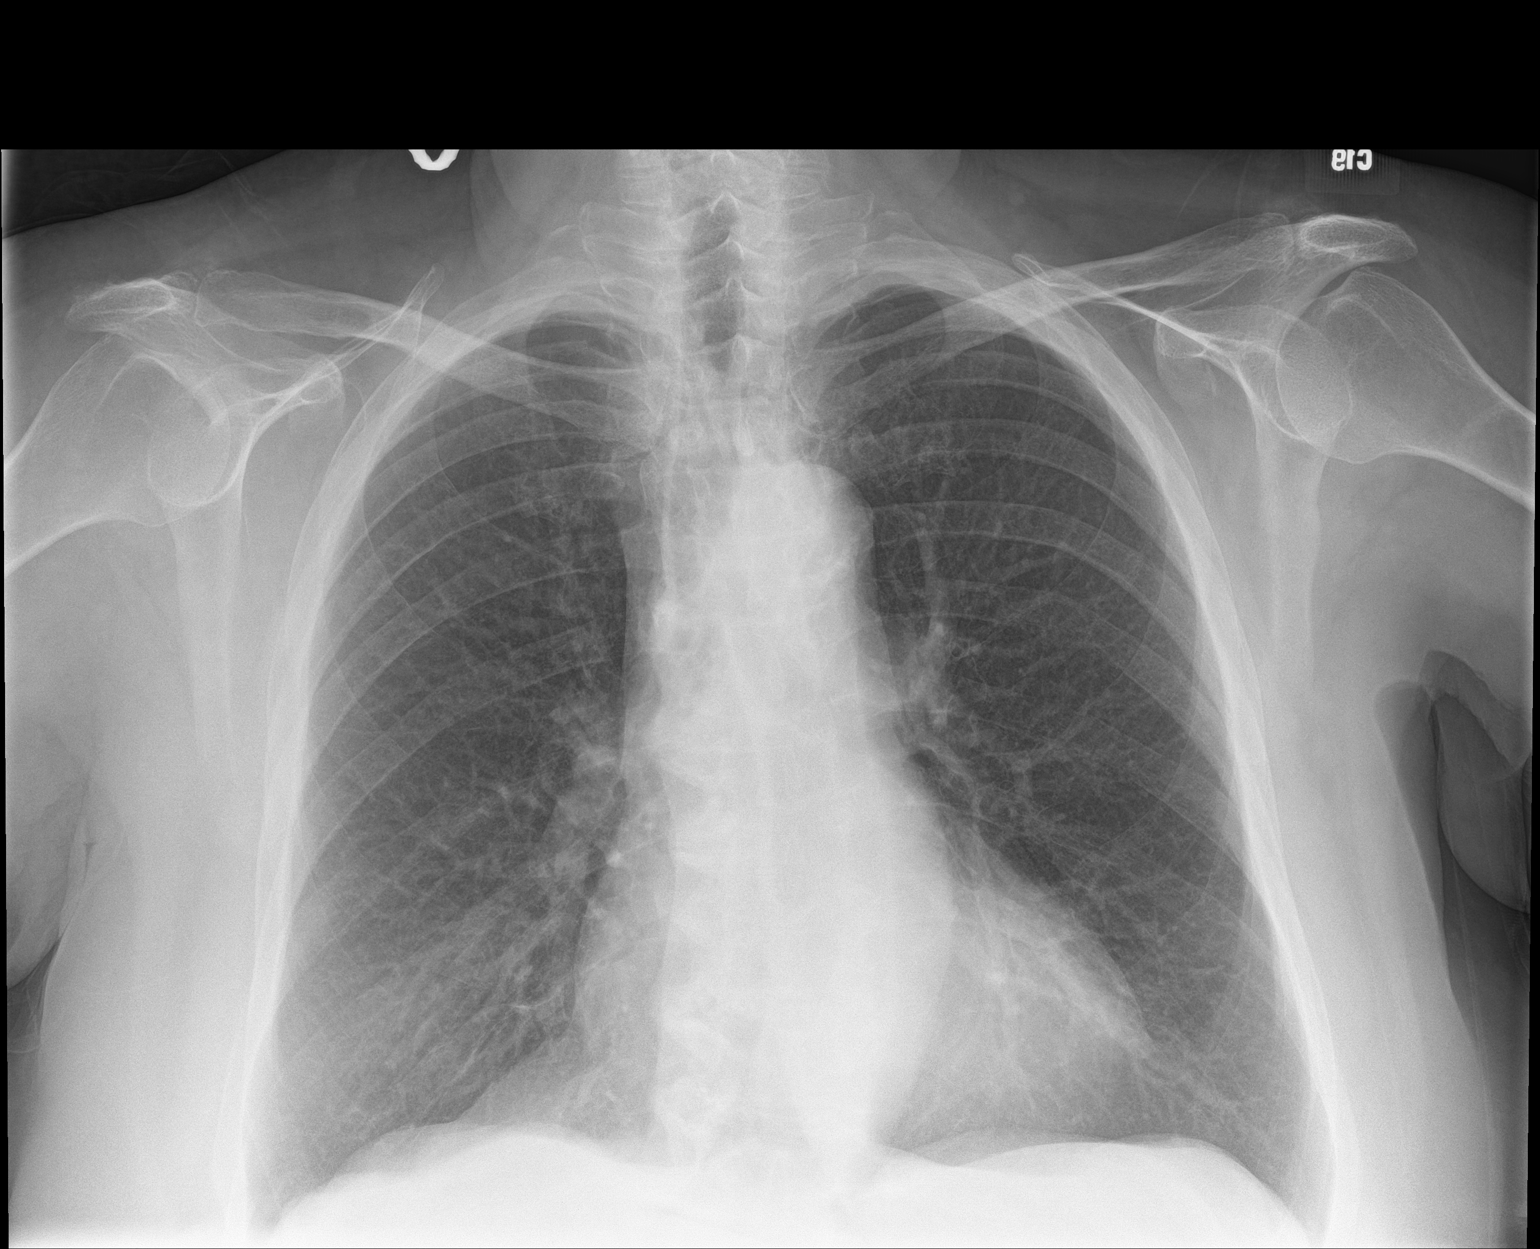

[chest lat]
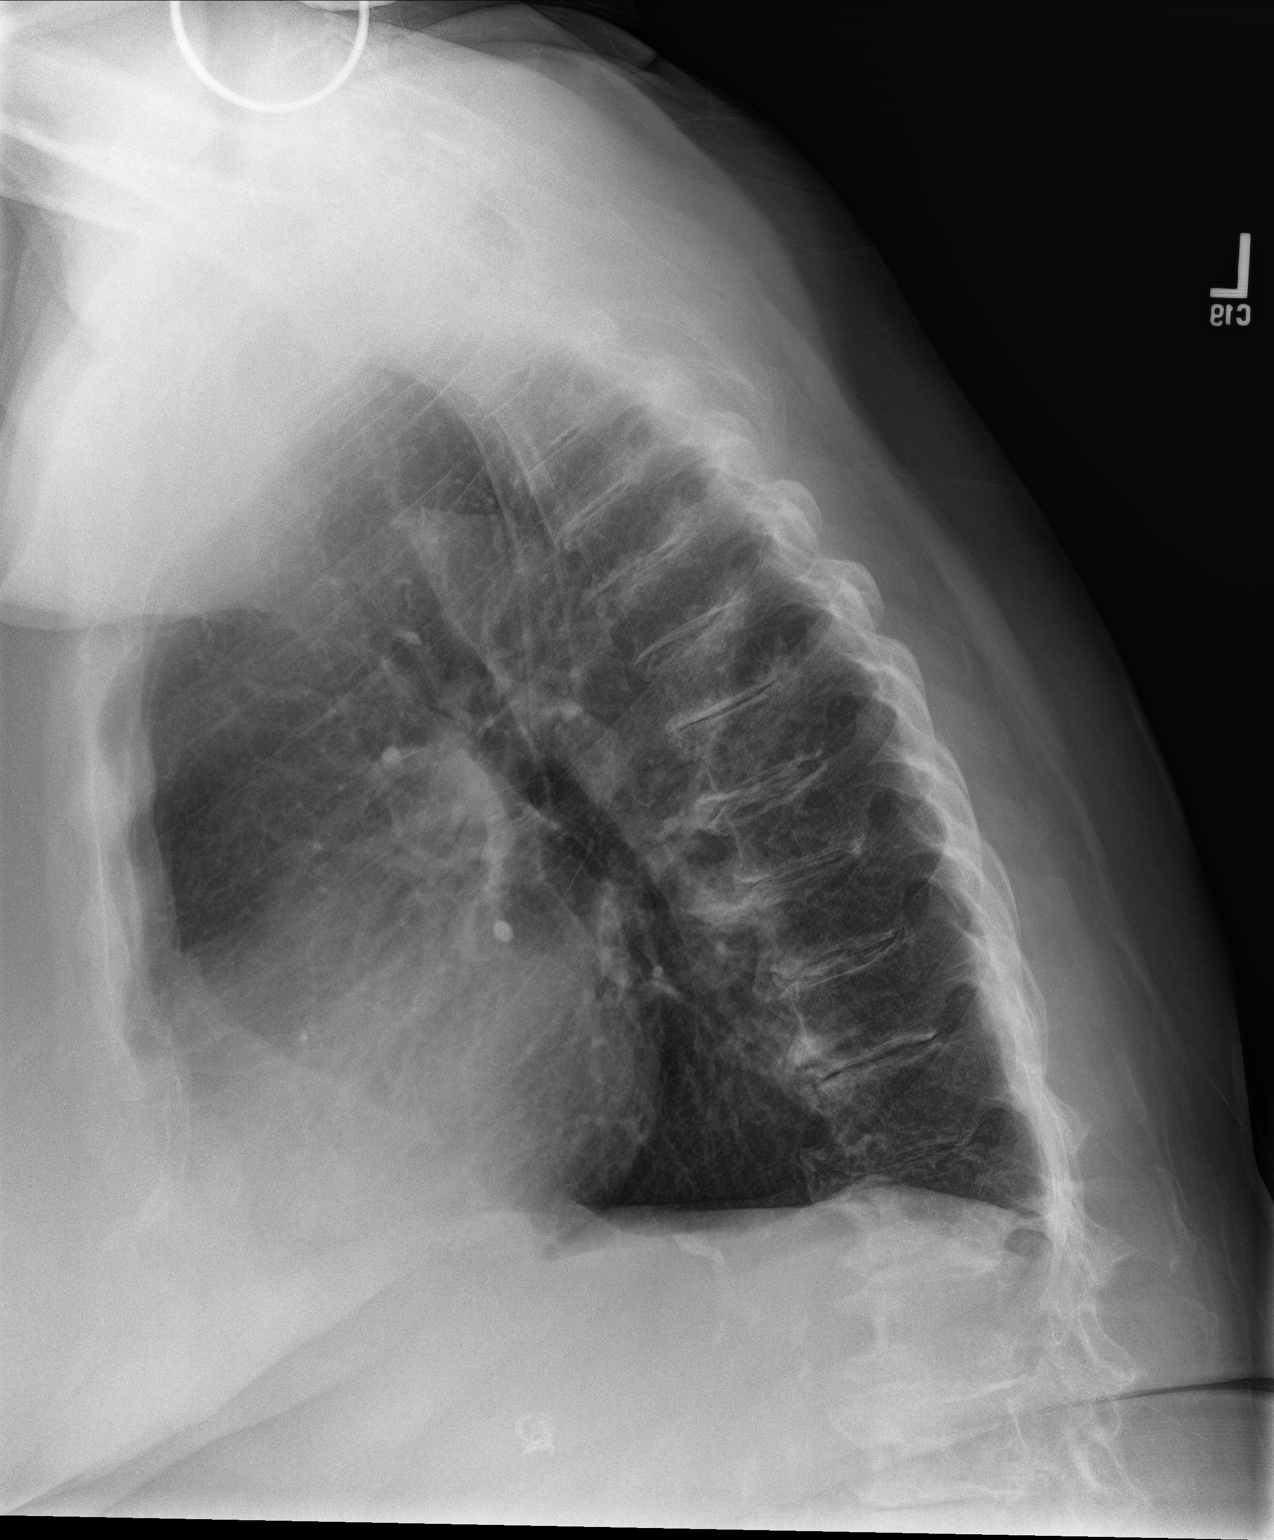

[2 of 2 positions shown; findings below may reference images not displayed]

FINDINGS: Mediastinum and hilar structures normal. Lungs are clear. No pleural
effusion or pneumothorax. Heart size normal. Degenerative change
thoracic spine.
IMPRESSION: No acute cardiopulmonary disease.

## 2019-11-23 DIAGNOSIS — Z23 Encounter for immunization: Secondary | ICD-10-CM | POA: Diagnosis not present

## 2019-11-29 NOTE — Unmapped (Signed)
Received call from pt reporting she lost her son last week. Provided emotional support and encouraged her to reach out if she needs any assistance or is feeling too overwhelmed. Pt reported getting COVID 19  booster shot on 8/20. Upon entering record, discovered 2nd dose was not entered. Called pt back and left vm requesting 2nd vaccine info.

## 2019-12-06 ENCOUNTER — Ambulatory Visit: Admit: 2019-12-06 | Discharge: 2019-12-07 | Payer: MEDICARE

## 2019-12-06 DIAGNOSIS — Z79899 Other long term (current) drug therapy: Secondary | ICD-10-CM | POA: Diagnosis not present

## 2019-12-06 DIAGNOSIS — Z944 Liver transplant status: Secondary | ICD-10-CM | POA: Diagnosis not present

## 2019-12-06 LAB — CBC W/ AUTO DIFF
BASOPHILS ABSOLUTE COUNT: 0.1 10*9/L (ref 0.0–0.2)
BASOPHILS RELATIVE PERCENT: 0.7 %
EOSINOPHILS ABSOLUTE COUNT: 0.2 10*9/L (ref 0.0–0.4)
EOSINOPHILS RELATIVE PERCENT: 2.7 %
HEMATOCRIT: 39.6 % (ref 34.0–44.0)
HEMOGLOBIN: 12.5 g/dL (ref 11.5–15.0)
LYMPHOCYTES ABSOLUTE COUNT: 2 10*9/L (ref 0.7–4.5)
LYMPHOCYTES RELATIVE PERCENT: 23.4 %
MEAN CORPUSCULAR HEMOGLOBIN CONC: 31.6 g/dL — ABNORMAL LOW (ref 32.0–36.0)
MEAN CORPUSCULAR HEMOGLOBIN: 30.7 pg (ref 27.0–34.0)
MEAN CORPUSCULAR VOLUME: 97.3 fL (ref 80.0–98.0)
MEAN PLATELET VOLUME: 10.1 fL (ref 7.4–10.4)
MONOCYTES ABSOLUTE COUNT: 0.7 10*9/L (ref 0.1–1.0)
MONOCYTES RELATIVE PERCENT: 8.6 %
NEUTROPHILS ABSOLUTE COUNT: 5.5 10*9/L (ref 1.8–7.8)
PLATELET COUNT: 254 10*9/L (ref 140–415)
RED CELL DISTRIBUTION WIDTH: 12.5 % (ref 11.5–14.5)
WBC ADJUSTED: 8.6 10*9/L (ref 4.0–10.5)

## 2019-12-06 LAB — COMPREHENSIVE METABOLIC PANEL
ALBUMIN: 4 g/dL (ref 3.5–5.0)
ALKALINE PHOSPHATASE: 77 U/L (ref 46–116)
ALT (SGPT): 30 U/L (ref 12–78)
ANION GAP: 8 mmol/L (ref 3–11)
AST (SGOT): 25 U/L (ref 15–40)
BILIRUBIN TOTAL: 0.4 mg/dL (ref 0.3–1.2)
BLOOD UREA NITROGEN: 26 mg/dL — ABNORMAL HIGH (ref 8–20)
BUN / CREAT RATIO: 20
CALCIUM: 8.7 mg/dL (ref 8.5–10.1)
CO2: 27.1 mmol/L (ref 21.0–32.0)
EGFR CKD-EPI AA FEMALE: 46 mL/min/{1.73_m2}
EGFR CKD-EPI NON-AA FEMALE: 40 mL/min/{1.73_m2}
GLUCOSE RANDOM: 103 mg/dL — ABNORMAL HIGH (ref 70–99)
POTASSIUM: 4.1 mmol/L (ref 3.5–5.0)
PROTEIN TOTAL: 7 g/dL (ref 6.0–8.0)
SODIUM: 142 mmol/L (ref 135–145)

## 2019-12-06 LAB — BILIRUBIN TOTAL: Bilirubin:MCnc:Pt:Ser/Plas:Qn:: 0.4

## 2019-12-06 LAB — BILIRUBIN DIRECT: Bilirubin.glucuronidated+Bilirubin.albumin bound:MCnc:Pt:Ser/Plas:Qn:: 0.14

## 2019-12-06 LAB — PHOSPHORUS: Phosphate:MCnc:Pt:Ser/Plas:Qn:: 3.7

## 2019-12-06 LAB — MAGNESIUM: Magnesium:MCnc:Pt:Ser/Plas:Qn:: 1.6

## 2019-12-06 LAB — GAMMA GLUTAMYL TRANSFERASE: Gamma glutamyl transferase:CCnc:Pt:Ser/Plas:Qn:: 44 — ABNORMAL HIGH

## 2019-12-06 LAB — MEAN CORPUSCULAR HEMOGLOBIN CONC: Erythrocyte mean corpuscular hemoglobin concentration:MCnc:Pt:RBC:Qn:Automated count: 31.6 — ABNORMAL LOW

## 2019-12-07 LAB — TACROLIMUS, TROUGH: Lab: 2.9 — ABNORMAL LOW

## 2019-12-10 NOTE — Unmapped (Signed)
[facility] Special[facility] Pharmacy Refill Coordination Note    Specialty Medication(s) to be Shipped:   Transplant: Prograf 1mg     Other medication(s) to be shipped: No additional medications requested for fill at this time     Tamara Morris, DOB: [birth year]  Phone: [phone] (home) [phone] (work)      All above HIPAA information was verified with patient.     Was a Nurse, learning disability used for this call? No    Completed refill call assessment today to schedule patient's medication shipment from the [facility] [phone]).       Specialty medication(s) and dose(s) confirmed: Regimen is correct and unchanged.   Changes to medications: Tamara Morris reports no changes at this time.  Changes to insurance: No  Questions for the pharmacist: No    Confirmed patient received Welcome Packet with first shipment. The patient will receive a drug information handout for each medication shipped and additional FDA Medication Guides as required.       DISEASE/MEDICATION-SPECIFIC INFORMATION        N/A    SPECIALTY MEDICATION ADHERENCE     Medication Adherence    Patient reported X missed doses in the last month: 0  Specialty Medication: Prograf 1mg   Patient is on additional specialty medications: No  Adherence tools used: patient uses a pill box to manage medications  Support network for adherence: family member        Prograf 1 mg: 10 days of medicine on hand     SHIPPING     Shipping address confirmed in Epic.     Delivery Scheduled: Yes, Expected medication delivery date: 12/17/2019.     Medication will be delivered via UPS to the prescription address in Epic WAM.    Lorelei Pont [facility] Specialty Technician

## 2019-12-16 MED FILL — PROGRAF 1 MG CAPSULE: ORAL | 30 days supply | Qty: 60 | Fill #6

## 2019-12-16 MED FILL — PROGRAF 1 MG CAPSULE: 30 days supply | Qty: 60 | Fill #6 | Status: AC

## 2019-12-23 DIAGNOSIS — E782 Mixed hyperlipidemia: Secondary | ICD-10-CM | POA: Diagnosis not present

## 2019-12-23 DIAGNOSIS — E7849 Other hyperlipidemia: Secondary | ICD-10-CM | POA: Diagnosis not present

## 2019-12-23 DIAGNOSIS — F419 Anxiety disorder, unspecified: Secondary | ICD-10-CM | POA: Diagnosis not present

## 2019-12-23 DIAGNOSIS — R05 Cough: Secondary | ICD-10-CM | POA: Diagnosis not present

## 2019-12-23 DIAGNOSIS — Z6833 Body mass index (BMI) 33.0-33.9, adult: Secondary | ICD-10-CM | POA: Diagnosis not present

## 2019-12-23 DIAGNOSIS — I10 Essential (primary) hypertension: Secondary | ICD-10-CM | POA: Diagnosis not present

## 2019-12-23 DIAGNOSIS — M542 Cervicalgia: Secondary | ICD-10-CM | POA: Diagnosis not present

## 2019-12-23 DIAGNOSIS — J302 Other seasonal allergic rhinitis: Secondary | ICD-10-CM | POA: Diagnosis not present

## 2019-12-23 DIAGNOSIS — J060 Acute laryngopharyngitis: Secondary | ICD-10-CM | POA: Diagnosis not present

## 2019-12-23 DIAGNOSIS — Z6834 Body mass index (BMI) 34.0-34.9, adult: Secondary | ICD-10-CM | POA: Diagnosis not present

## 2019-12-23 DIAGNOSIS — Z944 Liver transplant status: Secondary | ICD-10-CM | POA: Diagnosis not present

## 2019-12-23 DIAGNOSIS — R7303 Prediabetes: Secondary | ICD-10-CM | POA: Diagnosis not present

## 2019-12-27 DIAGNOSIS — R7303 Prediabetes: Secondary | ICD-10-CM | POA: Diagnosis not present

## 2019-12-27 DIAGNOSIS — K580 Irritable bowel syndrome with diarrhea: Secondary | ICD-10-CM | POA: Diagnosis not present

## 2019-12-27 DIAGNOSIS — E782 Mixed hyperlipidemia: Secondary | ICD-10-CM | POA: Diagnosis not present

## 2019-12-27 DIAGNOSIS — M542 Cervicalgia: Secondary | ICD-10-CM | POA: Diagnosis not present

## 2019-12-27 DIAGNOSIS — E669 Obesity, unspecified: Secondary | ICD-10-CM | POA: Diagnosis not present

## 2019-12-27 DIAGNOSIS — I129 Hypertensive chronic kidney disease with stage 1 through stage 4 chronic kidney disease, or unspecified chronic kidney disease: Secondary | ICD-10-CM | POA: Diagnosis not present

## 2019-12-27 DIAGNOSIS — Z944 Liver transplant status: Secondary | ICD-10-CM | POA: Diagnosis not present

## 2019-12-27 DIAGNOSIS — R7301 Impaired fasting glucose: Secondary | ICD-10-CM | POA: Diagnosis not present

## 2019-12-27 DIAGNOSIS — E875 Hyperkalemia: Secondary | ICD-10-CM | POA: Diagnosis not present

## 2019-12-27 DIAGNOSIS — F411 Generalized anxiety disorder: Secondary | ICD-10-CM | POA: Diagnosis not present

## 2019-12-27 DIAGNOSIS — N1831 Chronic kidney disease, stage 3a: Secondary | ICD-10-CM | POA: Diagnosis not present

## 2019-12-27 DIAGNOSIS — Z6833 Body mass index (BMI) 33.0-33.9, adult: Secondary | ICD-10-CM | POA: Diagnosis not present

## 2019-12-27 DIAGNOSIS — Z0001 Encounter for general adult medical examination with abnormal findings: Secondary | ICD-10-CM | POA: Diagnosis not present

## 2019-12-30 DIAGNOSIS — N183 Chronic kidney disease, stage 3 unspecified: Secondary | ICD-10-CM | POA: Diagnosis not present

## 2019-12-30 DIAGNOSIS — E7849 Other hyperlipidemia: Secondary | ICD-10-CM | POA: Diagnosis not present

## 2019-12-30 DIAGNOSIS — E559 Vitamin D deficiency, unspecified: Secondary | ICD-10-CM | POA: Diagnosis not present

## 2019-12-30 DIAGNOSIS — I129 Hypertensive chronic kidney disease with stage 1 through stage 4 chronic kidney disease, or unspecified chronic kidney disease: Secondary | ICD-10-CM | POA: Diagnosis not present

## 2020-01-07 NOTE — Unmapped (Signed)
Error

## 2020-01-10 NOTE — Unmapped (Signed)
[facility] Specialty Pharmacy Refill Coordination Note    Specialty Medication(s) to be Shipped:   Transplant: Prograf 1mg     Other medication(s) to be shipped: No additional medications requested for fill at this time     Janay Canan, DOB: 1940/07/28  Phone: [phone] (home) [phone] (work)      All above HIPAA information was verified with patient.     Was a Nurse, learning disability used for this call? No    Completed refill call assessment today to schedule patient's medication shipment from the [facility] [phone]).       Specialty medication(s) and dose(s) confirmed: Regimen is correct and unchanged.   Changes to medications: Haydyn reports no changes at this time.  Changes to insurance: No  Questions for the pharmacist: No    Confirmed patient received Welcome Packet with first shipment. The patient will receive a drug information handout for each medication shipped and additional FDA Medication Guides as required.       DISEASE/MEDICATION-SPECIFIC INFORMATION        N/A    SPECIALTY MEDICATION ADHERENCE     Medication Adherence    Patient reported X missed doses in the last month: 0  Specialty Medication: Prograf 1mg   Patient is on additional specialty medications: No  Adherence tools used: patient uses a pill box to manage medications  Support network for adherence: family member        Prograf 1 mg: 7 days of medicine on hand     SHIPPING     Shipping address confirmed in Epic.     Delivery Scheduled: Yes, Expected medication delivery date: 01/15/2020.     Medication will be delivered via UPS to the prescription address in Epic WAM.    Lorelei Pont [facility] Specialty Technician

## 2020-01-11 DIAGNOSIS — Z23 Encounter for immunization: Secondary | ICD-10-CM | POA: Diagnosis not present

## 2020-01-14 MED FILL — PROGRAF 1 MG CAPSULE: 30 days supply | Qty: 60 | Fill #7 | Status: AC

## 2020-01-14 MED FILL — PROGRAF 1 MG CAPSULE: ORAL | 30 days supply | Qty: 60 | Fill #7

## 2020-01-24 ENCOUNTER — Ambulatory Visit: Admit: 2020-01-24 | Discharge: 2020-01-25 | Payer: MEDICARE

## 2020-01-24 DIAGNOSIS — Z944 Liver transplant status: Secondary | ICD-10-CM | POA: Diagnosis not present

## 2020-01-24 DIAGNOSIS — Z79899 Other long term (current) drug therapy: Secondary | ICD-10-CM | POA: Diagnosis not present

## 2020-01-24 LAB — MAGNESIUM: Magnesium:MCnc:Pt:Ser/Plas:Qn:: 2

## 2020-01-24 LAB — COMPREHENSIVE METABOLIC PANEL
ALBUMIN: 4 g/dL (ref 3.5–5.0)
ALKALINE PHOSPHATASE: 84 U/L (ref 46–116)
ALT (SGPT): 30 U/L (ref 12–78)
ANION GAP: 6 mmol/L (ref 3–11)
BILIRUBIN TOTAL: 0.5 mg/dL (ref 0.3–1.2)
BLOOD UREA NITROGEN: 30 mg/dL — ABNORMAL HIGH (ref 8–20)
BUN / CREAT RATIO: 23
CALCIUM: 9.1 mg/dL (ref 8.5–10.1)
CO2: 28.1 mmol/L (ref 21.0–32.0)
CREATININE: 1.31 mg/dL — ABNORMAL HIGH (ref 0.60–1.10)
EGFR CKD-EPI AA FEMALE: 45 mL/min/{1.73_m2}
EGFR CKD-EPI NON-AA FEMALE: 39 mL/min/{1.73_m2}
GLUCOSE RANDOM: 105 mg/dL (ref 70–179)
POTASSIUM: 4.4 mmol/L (ref 3.5–5.0)
PROTEIN TOTAL: 7.1 g/dL (ref 6.0–8.0)
SODIUM: 140 mmol/L (ref 135–145)

## 2020-01-24 LAB — CBC W/ AUTO DIFF
BASOPHILS ABSOLUTE COUNT: 0.1 10*9/L (ref 0.0–0.2)
BASOPHILS RELATIVE PERCENT: 0.9 %
EOSINOPHILS ABSOLUTE COUNT: 0.3 10*9/L (ref 0.0–0.4)
EOSINOPHILS RELATIVE PERCENT: 2.8 %
HEMATOCRIT: 40.5 % (ref 34.0–44.0)
HEMOGLOBIN: 12.6 g/dL (ref 11.5–15.0)
LYMPHOCYTES RELATIVE PERCENT: 25.9 %
MEAN CORPUSCULAR HEMOGLOBIN: 30.1 pg (ref 27.0–34.0)
MEAN CORPUSCULAR VOLUME: 96.9 fL (ref 80.0–98.0)
MONOCYTES ABSOLUTE COUNT: 0.8 10*9/L (ref 0.1–1.0)
MONOCYTES RELATIVE PERCENT: 8.5 %
NEUTROPHILS ABSOLUTE COUNT: 5.8 10*9/L (ref 1.8–7.8)
NEUTROPHILS RELATIVE PERCENT: 61.6 %
PLATELET COUNT: 256 10*9/L (ref 140–415)
RED BLOOD CELL COUNT: 4.18 10*12/L (ref 3.80–5.10)
RED CELL DISTRIBUTION WIDTH: 13.1 % (ref 11.5–14.5)
WBC ADJUSTED: 9.3 10*9/L (ref 4.0–10.5)

## 2020-01-24 LAB — EOSINOPHILS ABSOLUTE COUNT: Eosinophils:NCnc:Pt:Bld:Qn:Automated count: 0.3

## 2020-01-24 LAB — PHOSPHORUS: Phosphate:MCnc:Pt:Ser/Plas:Qn:: 4.1

## 2020-01-24 LAB — EGFR CKD-EPI NON-AA FEMALE
Glomerular filtration rate/1.73 sq M.predicted.non black:ArVRat:Pt:Ser/Plas/Bld:Qn:Creatinine-based formula (CKD-EPI): 39

## 2020-01-24 LAB — GAMMA GLUTAMYL TRANSFERASE: Gamma glutamyl transferase:CCnc:Pt:Ser/Plas:Qn:: 46 — ABNORMAL HIGH

## 2020-01-24 LAB — BILIRUBIN DIRECT: Bilirubin.glucuronidated+Bilirubin.albumin bound:MCnc:Pt:Ser/Plas:Qn:: 0.11

## 2020-01-25 LAB — TACROLIMUS BLOOD: Lab: 3.6

## 2020-01-28 NOTE — Unmapped (Signed)
Called pt and left vm encouraging her to hydrate since creatinine was slightly elevated.

## 2020-01-29 DIAGNOSIS — R7301 Impaired fasting glucose: Secondary | ICD-10-CM | POA: Diagnosis not present

## 2020-01-29 DIAGNOSIS — I129 Hypertensive chronic kidney disease with stage 1 through stage 4 chronic kidney disease, or unspecified chronic kidney disease: Secondary | ICD-10-CM | POA: Diagnosis not present

## 2020-01-29 DIAGNOSIS — Z6834 Body mass index (BMI) 34.0-34.9, adult: Secondary | ICD-10-CM | POA: Diagnosis not present

## 2020-01-29 DIAGNOSIS — G8929 Other chronic pain: Secondary | ICD-10-CM | POA: Diagnosis not present

## 2020-01-29 DIAGNOSIS — N183 Chronic kidney disease, stage 3 unspecified: Secondary | ICD-10-CM | POA: Diagnosis not present

## 2020-01-29 DIAGNOSIS — K580 Irritable bowel syndrome with diarrhea: Secondary | ICD-10-CM | POA: Diagnosis not present

## 2020-01-29 DIAGNOSIS — Z944 Liver transplant status: Secondary | ICD-10-CM | POA: Diagnosis not present

## 2020-01-29 DIAGNOSIS — Z6833 Body mass index (BMI) 33.0-33.9, adult: Secondary | ICD-10-CM | POA: Diagnosis not present

## 2020-01-29 DIAGNOSIS — J069 Acute upper respiratory infection, unspecified: Secondary | ICD-10-CM | POA: Diagnosis not present

## 2020-01-29 DIAGNOSIS — E782 Mixed hyperlipidemia: Secondary | ICD-10-CM | POA: Diagnosis not present

## 2020-01-29 DIAGNOSIS — F419 Anxiety disorder, unspecified: Secondary | ICD-10-CM | POA: Diagnosis not present

## 2020-01-29 DIAGNOSIS — I10 Essential (primary) hypertension: Secondary | ICD-10-CM | POA: Diagnosis not present

## 2020-02-10 NOTE — Unmapped (Signed)
[facility] Refill Coordination Note    Specialty Medication(s) to be Shipped:   Transplant: Prograf 1mg     Other medication(s) to be shipped: No additional medications requested for fill at this time     Tamara Morris, DOB: 06-02-1940  Phone: [phone] (home) [phone] (work)      All above HIPAA information was verified with patient.     Was a Nurse, learning disability used for this call? No    Completed refill call assessment today to schedule patient's medication shipment from the [facility] [phone]).       Specialty medication(s) and dose(s) confirmed: Regimen is correct and unchanged.   Changes to medications: Tamara Morris reports no changes at this time.  Changes to insurance: No  Questions for the pharmacist: No    Confirmed patient received Welcome Packet with first shipment. The patient will receive a drug information handout for each medication shipped and additional FDA Medication Guides as required.       DISEASE/MEDICATION-SPECIFIC INFORMATION        N/A    SPECIALTY MEDICATION ADHERENCE     Medication Adherence    Patient reported X missed doses in the last month: 0  Specialty Medication: prograf 1mg   Patient is on additional specialty medications: No  Any gaps in refill history greater than 2 weeks in the last 3 months: no  Demonstrates understanding of importance of adherence: yes  Informant: patient  Reliability of informant: reliable  Provider-estimated medication adherence level: good  Patient is at risk for Non-Adherence: No  Adherence tools used: patient uses a pill box to manage medications  Support network for adherence: family member                prograf 1 mg: 7 days of medicine on hand         SHIPPING     Shipping address confirmed in Epic.     Delivery Scheduled: Yes, Expected medication delivery date: 11/10.     Medication will be delivered via UPS to the prescription address in Epic WAM.    Tamara Morris   [facility] Specialty Technician

## 2020-02-11 MED FILL — PROGRAF 1 MG CAPSULE: 30 days supply | Qty: 60 | Fill #8 | Status: AC

## 2020-02-11 MED FILL — PROGRAF 1 MG CAPSULE: ORAL | 30 days supply | Qty: 60 | Fill #8

## 2020-02-17 DIAGNOSIS — E782 Mixed hyperlipidemia: Secondary | ICD-10-CM | POA: Diagnosis not present

## 2020-02-17 DIAGNOSIS — R7301 Impaired fasting glucose: Secondary | ICD-10-CM | POA: Diagnosis not present

## 2020-02-17 DIAGNOSIS — Z6834 Body mass index (BMI) 34.0-34.9, adult: Secondary | ICD-10-CM | POA: Diagnosis not present

## 2020-02-17 DIAGNOSIS — K580 Irritable bowel syndrome with diarrhea: Secondary | ICD-10-CM | POA: Diagnosis not present

## 2020-02-17 DIAGNOSIS — G8929 Other chronic pain: Secondary | ICD-10-CM | POA: Diagnosis not present

## 2020-02-17 DIAGNOSIS — N183 Chronic kidney disease, stage 3 unspecified: Secondary | ICD-10-CM | POA: Diagnosis not present

## 2020-02-17 DIAGNOSIS — I10 Essential (primary) hypertension: Secondary | ICD-10-CM | POA: Diagnosis not present

## 2020-02-17 DIAGNOSIS — Z944 Liver transplant status: Secondary | ICD-10-CM | POA: Diagnosis not present

## 2020-02-17 DIAGNOSIS — Z6833 Body mass index (BMI) 33.0-33.9, adult: Secondary | ICD-10-CM | POA: Diagnosis not present

## 2020-02-17 DIAGNOSIS — J069 Acute upper respiratory infection, unspecified: Secondary | ICD-10-CM | POA: Diagnosis not present

## 2020-02-17 DIAGNOSIS — F419 Anxiety disorder, unspecified: Secondary | ICD-10-CM | POA: Diagnosis not present

## 2020-02-17 DIAGNOSIS — I129 Hypertensive chronic kidney disease with stage 1 through stage 4 chronic kidney disease, or unspecified chronic kidney disease: Secondary | ICD-10-CM | POA: Diagnosis not present

## 2020-03-11 NOTE — Unmapped (Signed)
[facility] Clinical Assessment & Refill Coordination Note    Tamara Morris, DOB: 23-Oct-1940  Phone: [phone] (home) [phone] (work)    All above HIPAA information was verified with patient.     Was a Nurse, learning disability used for this call? No    Specialty Medication(s):   Transplant: Prograf 1mg      Current Outpatient Medications   Medication Sig Dispense Refill   ??? cholecalciferol, vitamin D3 25 mcg, 1,000 units,, (CHOLECALCIFEROL-25 MCG, 1,000 UNIT,) 1,000 unit (25 mcg) tablet Take 25 mcg by mouth daily.     ??? acetaminophen (TYLENOL) 325 MG tablet Take 500 mg by mouth every six (6) hours as needed for pain.      ??? aspirin (ECOTRIN) 81 MG tablet Take 81 mg by mouth daily.     ??? hydrALAZINE (APRESOLINE) 25 MG tablet Take 25 mg by mouth Two (2) times a day.     ??? loperamide (IMODIUM A-D) 2 mg tablet Take 2 mg by mouth. Frequency:PRN   Dosage:2   MG  Instructions:  Note:Dose: 2MG      ??? LORazepam (ATIVAN) 0.5 MG tablet Take 0.5 mg by mouth. Frequency:PRN   Dosage:0.5   MG  Instructions:  Note:Dose: 0.5MG      ??? losartan (COZAAR) 100 MG tablet Take 100 mg by mouth daily.     ??? olmesartan (BENICAR) 20 MG tablet Take 20 mg by mouth daily.     ??? PROGRAF 1 mg capsule Take 1 capsule by mouth twice daily 60 capsule 11   ??? simvastatin (ZOCOR) 10 MG tablet Take 10 mg by mouth nightly.     ??? verapamil (CALAN-SR) 240 MG CR tablet        No current facility-administered medications for this visit.        Changes to medications: Tamara Morris reports starting the following medications: vitamin d3 1000units daily    Allergies   Allergen Reactions   ??? Codeine Other (See Comments)   ??? Hydromorphone Other (See Comments)   ??? Meperidine Other (See Comments)       Changes to allergies: No    SPECIALTY MEDICATION ADHERENCE     Prograf 1mg   : 10 days of medicine on hand      Medication Adherence    Patient reported X missed doses in the last month: 0  Specialty Medication: prograf 1mg   Adherence tools used: patient uses a pill box to manage medications  Support network for adherence: family member          Specialty medication(s) dose(s) confirmed: Regimen is correct and unchanged.     Are there any concerns with adherence? No    Adherence counseling provided? Not needed    CLINICAL MANAGEMENT AND INTERVENTION      Clinical Benefit Assessment:    Do you feel the medicine is effective or helping your condition? Yes    Clinical Benefit counseling provided? Not needed    Adverse Effects Assessment:    Are you experiencing any side effects? No    Are you experiencing difficulty administering your medicine? No    Quality of Life Assessment:    How many days over the past month did your transplant  keep you from your normal activities? For example, brushing your teeth or getting up in the morning. 0    Have you discussed this with your provider? Not needed    Therapy Appropriateness:    Is therapy appropriate? Yes, therapy is appropriate and should be continued  DISEASE/MEDICATION-SPECIFIC INFORMATION      N/A    PATIENT SPECIFIC NEEDS     - Does the patient have any physical, cognitive, or cultural barriers? No    - Is the patient high risk? No    - Does the patient require a Care Management Plan? No     - Does the patient require physician intervention or other additional services (i.e. nutrition, smoking cessation, social work)? No      SHIPPING     Specialty Medication(s) to be Shipped:   Transplant: Prograf 1mg     Other medication(s) to be shipped: No additional medications requested for fill at this time     Changes to insurance: No    Delivery Scheduled: Yes, Expected medication delivery date: 03/18/2020.     Medication will be delivered via UPS to the confirmed prescription address in [facility].    The patient will receive a drug information handout for each medication shipped and additional FDA Medication Guides as required.  Verified that patient has previously received a Conservation officer, historic buildings.    All of the patient's questions and concerns have been addressed.    Tamara Morris   [facility] Specialty Pharmacist

## 2020-03-13 ENCOUNTER — Ambulatory Visit: Admit: 2020-03-13 | Discharge: 2020-03-14 | Payer: MEDICARE

## 2020-03-13 DIAGNOSIS — Z944 Liver transplant status: Secondary | ICD-10-CM | POA: Diagnosis not present

## 2020-03-13 LAB — PHOSPHORUS: PHOSPHORUS: 4 mg/dL (ref 2.4–5.1)

## 2020-03-13 LAB — COMPREHENSIVE METABOLIC PANEL
ALBUMIN: 3.9 g/dL (ref 3.5–5.0)
ALKALINE PHOSPHATASE: 80 U/L (ref 46–116)
ALT (SGPT): 37 U/L (ref 12–78)
ANION GAP: 5 mmol/L (ref 3–11)
AST (SGOT): 27 U/L (ref 15–40)
BILIRUBIN TOTAL: 0.3 mg/dL (ref 0.3–1.2)
BLOOD UREA NITROGEN: 32 mg/dL — ABNORMAL HIGH (ref 8–20)
BUN / CREAT RATIO: 25
CALCIUM: 8.7 mg/dL (ref 8.5–10.1)
CHLORIDE: 105 mmol/L (ref 98–107)
CO2: 27.7 mmol/L (ref 21.0–32.0)
CREATININE: 1.3 mg/dL — ABNORMAL HIGH (ref 0.60–1.10)
EGFR CKD-EPI AA FEMALE: 45 mL/min/{1.73_m2}
EGFR CKD-EPI NON-AA FEMALE: 39 mL/min/{1.73_m2}
GLUCOSE RANDOM: 102 mg/dL (ref 70–179)
POTASSIUM: 4.9 mmol/L (ref 3.5–5.0)
PROTEIN TOTAL: 6.9 g/dL (ref 6.0–8.0)
SODIUM: 138 mmol/L (ref 135–145)

## 2020-03-13 LAB — BILIRUBIN, TOTAL AND DIRECT
BILIRUBIN DIRECT: 0.1 mg/dL (ref 0.10–0.30)
BILIRUBIN TOTAL: 0.3 mg/dL (ref 0.3–1.2)
BILIRUBIN, INDIRECT: 0.2 mg/dL

## 2020-03-13 LAB — CBC W/ AUTO DIFF
BASOPHILS ABSOLUTE COUNT: 0.1 10*9/L (ref 0.0–0.2)
BASOPHILS RELATIVE PERCENT: 0.8 %
EOSINOPHILS ABSOLUTE COUNT: 0.2 10*9/L (ref 0.0–0.4)
EOSINOPHILS RELATIVE PERCENT: 2.4 %
HEMATOCRIT: 39.5 % (ref 34.0–44.0)
HEMOGLOBIN: 12.6 g/dL (ref 11.5–15.0)
LYMPHOCYTES ABSOLUTE COUNT: 2.2 10*9/L (ref 0.7–4.5)
LYMPHOCYTES RELATIVE PERCENT: 23.5 %
MEAN CORPUSCULAR HEMOGLOBIN CONC: 31.9 g/dL — ABNORMAL LOW (ref 32.0–36.0)
MEAN CORPUSCULAR HEMOGLOBIN: 30.7 pg (ref 27.0–34.0)
MEAN CORPUSCULAR VOLUME: 96.1 fL (ref 80.0–98.0)
MEAN PLATELET VOLUME: 9.9 fL (ref 7.4–10.4)
MONOCYTES ABSOLUTE COUNT: 0.8 10*9/L (ref 0.1–1.0)
MONOCYTES RELATIVE PERCENT: 8.6 %
NEUTROPHILS ABSOLUTE COUNT: 5.9 10*9/L (ref 1.8–7.8)
NEUTROPHILS RELATIVE PERCENT: 64.5 %
PLATELET COUNT: 259 10*9/L (ref 140–415)
RED BLOOD CELL COUNT: 4.11 10*12/L (ref 3.80–5.10)
RED CELL DISTRIBUTION WIDTH: 13.8 % (ref 11.5–14.5)
WBC ADJUSTED: 9.2 10*9/L (ref 4.0–10.5)

## 2020-03-13 LAB — GAMMA GT: GAMMA GLUTAMYL TRANSFERASE: 43 U/L — ABNORMAL HIGH (ref 0–38)

## 2020-03-13 LAB — MAGNESIUM: MAGNESIUM: 2.1 mg/dL (ref 1.6–2.6)

## 2020-03-14 LAB — TACROLIMUS LEVEL: TACROLIMUS BLOOD: 3.7 ng/mL

## 2020-03-17 MED FILL — PROGRAF 1 MG CAPSULE: 30 days supply | Qty: 60 | Fill #9 | Status: AC

## 2020-03-17 MED FILL — PROGRAF 1 MG CAPSULE: ORAL | 30 days supply | Qty: 60 | Fill #9

## 2020-04-03 DIAGNOSIS — N183 Chronic kidney disease, stage 3 unspecified: Secondary | ICD-10-CM | POA: Diagnosis not present

## 2020-04-03 DIAGNOSIS — R7301 Impaired fasting glucose: Secondary | ICD-10-CM | POA: Diagnosis not present

## 2020-04-03 DIAGNOSIS — Z6833 Body mass index (BMI) 33.0-33.9, adult: Secondary | ICD-10-CM | POA: Diagnosis not present

## 2020-04-03 DIAGNOSIS — Z944 Liver transplant status: Secondary | ICD-10-CM | POA: Diagnosis not present

## 2020-04-03 DIAGNOSIS — G8929 Other chronic pain: Secondary | ICD-10-CM | POA: Diagnosis not present

## 2020-04-03 DIAGNOSIS — E782 Mixed hyperlipidemia: Secondary | ICD-10-CM | POA: Diagnosis not present

## 2020-04-03 DIAGNOSIS — F419 Anxiety disorder, unspecified: Secondary | ICD-10-CM | POA: Diagnosis not present

## 2020-04-03 DIAGNOSIS — I10 Essential (primary) hypertension: Secondary | ICD-10-CM | POA: Diagnosis not present

## 2020-04-03 DIAGNOSIS — J069 Acute upper respiratory infection, unspecified: Secondary | ICD-10-CM | POA: Diagnosis not present

## 2020-04-03 DIAGNOSIS — K580 Irritable bowel syndrome with diarrhea: Secondary | ICD-10-CM | POA: Diagnosis not present

## 2020-04-03 DIAGNOSIS — I129 Hypertensive chronic kidney disease with stage 1 through stage 4 chronic kidney disease, or unspecified chronic kidney disease: Secondary | ICD-10-CM | POA: Diagnosis not present

## 2020-04-03 DIAGNOSIS — Z6834 Body mass index (BMI) 34.0-34.9, adult: Secondary | ICD-10-CM | POA: Diagnosis not present

## 2020-04-08 NOTE — Unmapped (Signed)
[facility] Refill Coordination Note    Specialty Medication(s) to be Shipped:   Transplant: Prograf 1mg     Other medication(s) to be shipped: No additional medications requested for fill at this time     Tamara Morris, DOB: 11/16/1940  Phone: [phone] (home) [phone] (work)      All above HIPAA information was verified with patient.     Was a Nurse, learning disability used for this call? No    Completed refill call assessment today to schedule patient's medication shipment from the [facility] [phone]).       Specialty medication(s) and dose(s) confirmed: Regimen is correct and unchanged.   Changes to medications: Tamara Morris reports no changes at this time.  Changes to insurance: No  Questions for the pharmacist: No    Confirmed patient received Welcome Packet with first shipment. The patient will receive a drug information handout for each medication shipped and additional FDA Medication Guides as required.       DISEASE/MEDICATION-SPECIFIC INFORMATION        N/A    SPECIALTY MEDICATION ADHERENCE     Medication Adherence    Patient reported X missed doses in the last month: 0  Specialty Medication: PROGRAF 1 mg  Patient is on additional specialty medications: No  Any gaps in refill history greater than 2 weeks in the last 3 months: no  Demonstrates understanding of importance of adherence: yes  Informant: patient  Reliability of informant: reliable  Adherence tools used: patient uses a pill box to manage medications  Support network for adherence: family member  Confirmed plan for next specialty medication refill: delivery by pharmacy  Refills needed for supportive medications: not needed                prograf 1 mg: 10 days of medicine on hand         SHIPPING     Shipping address confirmed in Epic.     Delivery Scheduled: Yes, Expected medication delivery date: 04/14/2020.     Medication will be delivered via UPS to the prescription address in Epic WAM.    Tamara Morris   [facility] Specialty Technician

## 2020-04-09 DIAGNOSIS — Z20822 Contact with and (suspected) exposure to covid-19: Secondary | ICD-10-CM | POA: Diagnosis not present

## 2020-04-09 DIAGNOSIS — U071 COVID-19: Principal | ICD-10-CM

## 2020-04-09 DIAGNOSIS — Z944 Liver transplant status: Principal | ICD-10-CM

## 2020-04-10 DIAGNOSIS — U071 COVID-19: Principal | ICD-10-CM

## 2020-04-10 NOTE — Unmapped (Signed)
Received vm from pt's daughter requesting return call. Called back and she reported local infusion clinic is not offering MAB therapy. Explained to pt's daughter that referral has been placed for [facility] and someone will reach out to her today to schedule appt for pt.

## 2020-04-10 NOTE — Unmapped (Signed)
COVID Infusion Therapy Questionnaire    Have you received a positive COVID test result (excluding antibody testing) AND are currently in an outpatient setting? Yes  Do you have at least one mild or moderate COVID symptom that began no more than 5 days ago? Yes  What date did you test COVID+ 04/09/2020  What date did your symptoms start 04/07/2020  Which of the following COVID symptoms are you experiencing: Cough and Muscle/body aches  Do you (1) have new oxygen requirements or (2) increased oxygen requirement due to COVID-19? No   Patient is under 27 years of age? No    Vaccination status:  ??? Patient has received a COVID-19 vaccine: Yes  ??? Manufacturer: Moderna  ??? # of doses received: 3 doses  What date did you receive your last dose (mm/dd/yyyy)    Screening for Sotrovimab/Paxlovid  ??? Age 27 or older AND unvaccinated: No - Continue screening for other mAb  ??? BMI 40 or over AND unvaccinated: No - Continue screening for other mAb    Regardless of vaccine status:  ??? Age 29 or older: No - Continue screening for other mAb  ??? Do you have a severe chronic lung disease that keeps you so short of breath that it is difficult for you to go places or requires you to be on oxygen: No  ??? Severely immunocompromised: No  o Have you had a solid organ transplant within the past 6 months?  o Have you had a bone marrow transplant within the past year?  o Have you had leukemia or lymphoma or multiple myeloma?  o Are you receiving a medication prescribed by a Rheumatologist or other specialist to control an autoimmune condition like lupus? (e.g., rituximab, ocrelizumab, ofatumumab, alemtuzumab, anifrolumab, or belimumab)  o Were you born with a severe immunocompromising condition?      Screening for CASI/IMD (18 and up)  ??? Over the age of 12: Yes  ??? Pregnant: No  ??? Recent delivery within 6 weeks: No  ??? BMI 35 or above: No  o Patient BMI: 32.1  ??? Immunosuppressive disease (e.g., cancer, not in remission; solid organ transplant; HIV; CD4 <200 cells/m3): Yes  ??? Currently or in the past 3 months receiving immunosuppressive treatment (e.g., chemotherapy; rituximab; steroid use at least 20 mg/day or at least 2 mg/kg/day prednisone or equivalent for at least 14 days): Yes  ??? Diabetes if BMI > 30: No  ??? Cardiovascular disease if BMI >30: No  ??? Hypertension if BMI >30: Yes  ??? Chronic lung disease (e.g., COPD, cystic fibrosis, pulmonary arterial hypertension - requiring daily inhaled steroids plus a second controller medicine or history of ICU admission for asthma): No  ??? Neurodevelopmental disorders affecting respiratory function (e.g., cerebral palsy, etc): No  ??? Sickle cell disease: No   ??? Congenital heart disease causing significant hemodynamic compromise: No  ??? Medical related technological dependence (e.g., tracheostomy, gastrostomy, or positive pressure ventilation [not related to COVID-19]): No      Resolution  Patient qualifies - Because you meet the prioritized eligibility criteria at [facility] at this time, we can offer monoclonal antibody therapy.  The [place] FDA has issued an Emergency Use Authorization to permit the emergency use of the unapproved monoclonal antibodies for the treatment of mild to moderate coronavirus disease 2019 (COVID-19).  This treatment may help to prevent progression on to severe symptoms and/or hospitalization in those who are confirmed to be high-risk COVID (+) and are within 10 days of symptom onset.  Answered all questions utilizing FAQ sheet.  Advised patient that those receiving monoclonal or plasma products should wait 90 days until getting COVID-19 immunized. At this time there are no approved and available alternative treatments for out-patients.    Patient Agreed to therapy. Scheduling notified.  Patient County: [place]

## 2020-04-10 NOTE — Unmapped (Signed)
COVID Infusion Therapy Questionnaire    Have you received a positive COVID test result (excluding antibody testing) AND are currently in an outpatient setting? yes  Do you have at least one mild or moderate COVID symptom that began no more than 5 days ago? yes  What date did you test COVID+ (mm/dd/yyyy): 04-09-20  What date did your symptoms start (mm/dd/yyyy): 04-06-20  Which of the following COVID symptoms are you experiencing: cough, congested, wheezing  Do you (1) have new oxygen requirements or (2) increased oxygen requirement due to COVID-19? no   Patient is under 56 years of age? no    Vaccination status:  ??? Patient has received a COVID-19 vaccine: yes  ??? Manufacturer: Moderna  ??? # of doses received: 3  ??? What date did you receive your last dose (mm/dd/yyyy): 11-2019      Screening for Sotrovimab/Paxlovid  ??? Age 80 or older AND unvaccinated: yes  ??? BMI 40 or over AND unvaccinated:   no  Regardless of vaccine status:  ??? Age 30 or older: no  ??? Do you have a severe chronic lung disease that keeps you so short of breath that it is difficult for you to go places or requires you to be on oxygen: no  ??? Severely immunocompromised:   o Have you had a solid organ transplant within the past 6 months? Liver transplant 24 years ago  o Have you had a bone marrow transplant within the past year?No  o Have you had leukemia or lymphoma or multiple myeloma?no  o Are you receiving a medication prescribed by a Rheumatologist or other specialist to control an autoimmune condition like lupus? (e.g., rituximab, ocrelizumab, ofatumumab, alemtuzumab, anifrolumab, or belimumab) no  o Were you born with a severe immunocompromising condition?no      Screening for CASI/IMD (18 and up)  ??? Over the age of 8: yes  ??? Pregnant: no  ??? Recent delivery within 6 weeks: no  ??? BMI 35 or above: no  o Patient BMI: 30.8  ??? Immunosuppressive disease (e.g., cancer, not in remission; solid organ transplant; HIV; CD4 <200 cells/m3): no  ??? Currently or in the past 3 months receiving immunosuppressive treatment (e.g., chemotherapy; rituximab; steroid use at least 20 mg/day or at least 2 mg/kg/day prednisone or equivalent for at least 14 days): no  ??? Diabetes if BMI > 30: no  ??? Cardiovascular disease if BMI >30: no  ??? Hypertension if BMI >30: yes  ??? Chronic lung disease (e.g., COPD, cystic fibrosis, pulmonary arterial hypertension - requiring daily inhaled steroids plus a second controller medicine or history of ICU admission for asthma): no  ??? Neurodevelopmental disorders affecting respiratory function (e.g., cerebral palsy, etc): no  ??? Sickle cell disease: no   ??? Congenital heart disease causing significant hemodynamic compromise: no  ??? Medical related technological dependence (e.g., tracheostomy, gastrostomy, or positive pressure ventilation [not related to COVID-19]): no      Resolution  Patient qualifies    Patient County: [place]

## 2020-04-10 NOTE — Unmapped (Signed)
Received call from pt's daughter reporting pt tested positive for COVID and was prescribed Methylprednisolone and wanted to know if she could take it. Okayed steroid use and also recommended MAB therapy. Pt's daughter will call local hospital to see if MAB therapy is offered. This coordinator will place internal referral in case local hospital is not offering. Pt's daughter verbalized understanding.

## 2020-04-11 NOTE — Unmapped (Signed)
A user error has taken place: encounter opened in error, closed for administrative reasons.

## 2020-04-11 NOTE — Unmapped (Signed)
Error

## 2020-04-13 ENCOUNTER — Ambulatory Visit: Admit: 2020-04-13 | Discharge: 2020-04-14 | Payer: MEDICARE

## 2020-04-13 DIAGNOSIS — Z23 Encounter for immunization: Secondary | ICD-10-CM | POA: Diagnosis not present

## 2020-04-13 DIAGNOSIS — U071 COVID-19: Secondary | ICD-10-CM | POA: Diagnosis not present

## 2020-04-13 MED ADMIN — sotrovimab 500 mg in sodium chloride (NS) 0.9 % 100 mL IVPB: 500 mg | INTRAVENOUS | @ 14:00:00 | Stop: 2020-04-13

## 2020-04-13 NOTE — Unmapped (Signed)
Tamara Morris 's Prograf shipment will be delayed as a result of a high copay.     I have reached out to the patient and left a voicemail message.  We will wait for a call back from the patient to reschedule the delivery.  We have not confirmed the new delivery date.

## 2020-04-13 NOTE — Unmapped (Signed)
0825  Patient arrived to Room 316 for Mab infusion. # 20, IV access obtained in right arm . Hospitalist in to see patient @ 1020. Infusion began @ 0854. Ended @ [id number]. Tolerated infusion well. No complaints.  EAU, AVS, Prone education, Covid Care, Covid Quick Facts, Privacy practice & consent given to patient. Observed for 1 hour. IV access removed. Patient escorted to main entrance by staff @ 1030.

## 2020-04-13 NOTE — Unmapped (Addendum)
Sleep on stomach if possible.     Take Vitamin D, Vitamin C, & Zinc daily.     Increase fluid intake.    Contact your family doctor or emergency room for worsening symptoms.   Patient Education        Learning How To Care for Someone Who Has COVID-19  Things to know  Most people who get COVID-19 will recover with time and home care. Here are some things to know if you're caring for someone who's sick.  ?? Treat the symptoms.   Common symptoms include a fever, coughing, and feeling short of breath. Urge the person to get extra rest and drink plenty of fluids to replace fluids lost from fever.  To reduce a fever, offer acetaminophen (Tylenol) or ibuprofen (Advil, Motrin). It may also help with muscle aches. Read and follow all instructions on the label.  ?? Watch for signs that the illness is getting worse.   The person may need medical care if they're getting sicker (for example, if it's hard to breathe). But call the doctor's office before you go. They can tell you what to do.  Call 911  or emergency services if the person has any of these symptoms:  ? Severe trouble breathing or shortness of breath.  ? Constant pain or pressure in their chest.  ? Confusion, or trouble thinking clearly.  ? Pale, gray, or blue-colored skin or lips.  Some people are more likely to get very sick and need medical care. Call the doctor as soon as symptoms start if the person you're caring for is over 65, smokes, or has a serious health problem, like asthma, heart disease, diabetes, or an immune system problem.  ?? Protect yourself and others.   The virus spreads easily from person to person, so take extra care to avoid catching or spreading the infection.  ? Keep the sick person away from others as much as you can.  ?? Have the person stay in one room. If you can, give them their own bathroom to use.  ?? Have only one person take care of them. Keep other people???and pets???out of the sickroom.  ?? Have the person wear a mask around other people. This includes when anyone is in the room with them or if they leave their room (for example, to go to the bathroom).  ?? Don't share personal items. These include dishes, cups, towels, and bedding.  ? Wash your hands often and well. Use soap and water, and scrub for at least 20 seconds. This is especially important after you've been around the sick person or touched things they've touched. If soap and water aren't handy, use an alcohol-based hand sanitizer.  ? Wear a mask when caring for someone who is sick. And wear a mask when you're around other people after you've cared for someone who's sick.  ? Avoid touching your mouth, nose, and eyes.  ? Take care with the person's laundry. It's okay to wash the sick person's laundry with yours. If you have them, wear disposable gloves when handling their dirty laundry, and wash your hands well after you touch it. Wash items in the warmest water allowed for the fabric type, and dry them completely.  ? Clean high-touch items every day and anytime the sick person touches them. These include doorknobs, light switches, toilets, counters, and remote controls. Use a household disinfectant or a homemade bleach solution. (Follow the directions on the label.) If the sick person has their own room,  have them disinfect it every day.  ? Avoid having visitors. If you have to have visitors, everyone needs to wear a mask and stay at least 6 feet (2 meters) away from you. And keep the visit as short as possible. To help protect family and friends, stay in touch with them only by phone or computer.  ? If you haven't had COVID-19 in the past 3 months or aren't fully vaccinated, you may need to quarantine.  Where can you learn more?  Go to [facility] at https://my[facility]chart.Armed forces logistics/support/administrative officer in the Menu. Enter [phone] in the search box to learn more about Learning How To Care for Someone Who Has COVID-19.  Current as of: June 28, 2019??????????????????????????????Content Version: 13.1  ?? 2006-2021 [employer] Inc[employer].   Care instructions adapted under license by [facility]. If you have questions about a medical condition or this instruction, always ask your healthcare professional. [employer], Inc[employer] disclaims any warranty or liability for your use of this information.         Patient Education        10 Things to Do When You Have COVID-19      Stay home.  Don't go to school, work, or public areas. And don't use public transportation, ride-shares, or taxis unless you have no choice. Leave your home only if you need to get medical care. But call the doctor's office first so they know you're coming. And wear a mask.     Ask before leaving isolation.  Follow your doctor's advice about when it is safe for you to leave isolation.     Wear a mask when you are around other people.  It can help stop the spread of the virus.     Limit contact with people in your home.  If possible, stay in a separate bedroom and use a separate bathroom.     Avoid contact with pets and other animals.  If possible, have a friend or family member care for them while you're sick.     Cover your mouth and nose with a tissue when you cough or sneeze.  Then throw the tissue in the trash right away.     Wash your hands often, especially after you cough or sneeze.  Use soap and water, and scrub for at least 20 seconds. If soap and water aren't available, use an alcohol-based hand sanitizer.     Don't share personal household items.  These include bedding, towels, cups and glasses, and eating utensils.     Clean and disinfect your home every day.  Use household cleaners or disinfectant wipes or sprays.     If needed, take acetaminophen (Tylenol) or ibuprofen (Advil, Motrin) to relieve fever and body aches.  Read and follow all instructions on the label.   Current as of: June 28, 2019??????????????????????????????Content Version: 13.1  ?? 2006-2021 [employer], [employer].   Care instructions adapted under license by [facility]. If you have questions about a medical condition or this instruction, always ask your healthcare professional. [employer] disclaims any warranty or liability for your use of this information.         Patient Education        Learning About Changing Your Position for Breathing  What are proning and repositioning?     Proning and repositioning your body may help you feel better if you have mild trouble breathing. Changing positions can help your lungs work better. You rotate through positions in bed: lying on your  belly (proning), on your right and left sides, and sitting up.  How do you prepare?  It's important to do proning and repositioning only after talking with your doctor. You need to be able to move your body around in bed by yourself. Don't try a position if it's uncomfortable. Reasons for this may include:  ?? Recent belly surgery.  ?? Weakness or pain anywhere.  ?? Any condition that may make it hard to lie on your belly or sides.  How is it done?  Before you start, get some towels and pillows ready to help support you and make you comfortable when you lie in these positions.  Stay in each of these positions for 30 minutes to 2 hours. The amount of time that's right for you depends on your doctor's advice and how long you can stay comfortable. If any of these positions start to hurt or make your breathing worse, stop and try another one.  Lie flat on your belly.    Putting rolled-up towels or pillows under your hips and head may help make you more comfortable.  Lie on your right side.    Sit up in bed or in a chair.    Try not to slouch.  Lie on your left side.     Repeat the positions as needed or as your doctor tells you.  When should you call for help?   Call 911 anytime you think you may need emergency care. For example, call if you have life-threatening symptoms, such as:  ?? ?? You have severe trouble breathing. (You can't talk at all.)   Call your doctor now or seek immediate medical care if: ?? You have moderate trouble breathing. (You can't speak a full sentence.)   Watch closely for changes in your health, and be sure to contact your doctor if:  ?? ?? Your symptoms get worse.   Current as of: June 28, 2019??????????????????????????????Content Version: 13.1  ?? 2006-2021 [employer], [employer].   Care instructions adapted under license by [facility]. If you have questions about a medical condition or this instruction, always ask your healthcare professional. [employer] disclaims any warranty or liability for your use of this information.

## 2020-04-14 MED FILL — PROGRAF 1 MG CAPSULE: ORAL | 30 days supply | Qty: 60 | Fill #10

## 2020-04-15 NOTE — Unmapped (Signed)
The patient presented to [facility] from his PCP's office for monoclonal antibody infusion.  The risks/benefits of the infusion were discussed with the patient.  All questions were answered.     Physical Exam  Vitals and nursing note reviewed.   Neurological:      Mental Status: She is alert and oriented to person, place, and time.   Psychiatric:         Behavior: Behavior normal.

## 2020-04-29 NOTE — Unmapped (Signed)
A user error has taken place: encounter opened in error, closed for administrative reasons.

## 2020-05-08 ENCOUNTER — Other Ambulatory Visit (HOSPITAL_COMMUNITY): Payer: Self-pay | Admitting: Internal Medicine

## 2020-05-08 DIAGNOSIS — Z1231 Encounter for screening mammogram for malignant neoplasm of breast: Secondary | ICD-10-CM

## 2020-05-15 ENCOUNTER — Other Ambulatory Visit: Payer: Self-pay

## 2020-05-15 ENCOUNTER — Ambulatory Visit (HOSPITAL_COMMUNITY)
Admission: RE | Admit: 2020-05-15 | Discharge: 2020-05-15 | Disposition: A | Discharge status: Home / Self Care | Payer: Medicare Other | Source: Ambulatory Visit | Attending: Internal Medicine | Admitting: Internal Medicine

## 2020-05-15 DIAGNOSIS — Z1231 Encounter for screening mammogram for malignant neoplasm of breast: Secondary | ICD-10-CM | POA: Diagnosis not present

## 2020-05-16 DIAGNOSIS — Z23 Encounter for immunization: Secondary | ICD-10-CM | POA: Diagnosis not present

## 2020-05-18 NOTE — Unmapped (Signed)
Received call from pt who was looking for phone number for pharm shared services. Gave her number and then asked if pt had received letter re post liver lab and annual appt protocol change. Pt did not receive letter. Told her this tpa would talk to her primary coord about lab frequency and pt asked for call to be returned to either her or her daughter Tamara Morris.  Pt shared news of son's death last year and how it has affected her but she feels she's doing slightly better now and that primary coord has been communicating with daughter more recently bc pt would get too upset on phone. Gave her emotional support. She was complimentary of primary coord and team and said she missed seeing team. She verbalized understanding of all discussed.    Called pt's daughter Tamara Morris and told her pt only needs labs every 3 months and no longer need liver US and cxr at annual appts. She said she would tell pt and verbalized understanding.

## 2020-05-18 NOTE — Unmapped (Signed)
[facility] Pharm[facility] Refill Coordination Note    Specialty Medication(s) to be Shipped:   Transplant: Prograf 1mg     Other medication(s) to be shipped: No additional medications requested for fill at this time     Tamara Morris, DOB: 01/03/1941  Phone: [phone] (home)       All above HIPAA information was verified with patient.     Was a Nurse, learning disability used for this call? No    Completed refill call assessment today to schedule patient's medication shipment from the [facility] [phone]).       Specialty medication(s) and dose(s) confirmed: Regimen is correct and unchanged.   Changes to medications: [name] reports no changes at this time.  Changes to insurance: No  Questions for the pharmacist: No    Confirmed patient received Welcome Packet with first shipment. The patient will receive a drug information handout for each medication shipped and additional FDA Medication Guides as required.       DISEASE/MEDICATION-SPECIFIC INFORMATION        N/A    SPECIALTY MEDICATION ADHERENCE     Medication Adherence    Patient reported X missed doses in the last month: 0  Specialty Medication: Prograf  Patient is on additional specialty medications: No  Patient is on more than two specialty medications: No  Any gaps in refill history greater than 2 weeks in the last 3 months: no  Demonstrates understanding of importance of adherence: yes  Informant: patient  Adherence tools used: patient uses a pill box to manage medications  Support network for adherence: family member                Prograf 1mg : Patient has 5 days of medication on hand      SHIPPING     Shipping address confirmed in Epic.     Delivery Scheduled: Yes, Expected medication delivery date: 2/16.     Medication will be delivered via UPS to the prescription address in Epic WAM.    Olga Millers   [facility] Specialty Technician

## 2020-05-19 MED FILL — PROGRAF 1 MG CAPSULE: ORAL | 30 days supply | Qty: 60 | Fill #11

## 2020-06-10 DIAGNOSIS — Z944 Liver transplant status: Principal | ICD-10-CM

## 2020-06-10 MED ORDER — PROGRAF 1 MG CAPSULE
ORAL_CAPSULE | Freq: Two times a day (BID) | ORAL | 11 refills | 30.00000 days | Status: CP
Start: 2020-06-10 — End: 2021-06-10
  Filled 2020-06-12: qty 60, 30d supply, fill #0

## 2020-06-10 NOTE — Unmapped (Signed)
[facility] Special[facility] Pharm[facility] Refill Coordination Note    Specialty Medication(s) to be Shipped:   Transplant: Prograf 1mg     Other medication(s) to be shipped: No additional medications requested for fill at this time     Tamara Morris, DOB: 09/09/1940  Phone: [phone] (home)       All above HIPAA information was verified with patient.     Was a Nurse, learning disability used for this call? No    Completed refill call assessment today to schedule patient's medication shipment from the [facility] [phone]).       Specialty medication(s) and dose(s) confirmed: Regimen is correct and unchanged.   Changes to medications: Tamara Morris reports no changes at this time.  Changes to insurance: No  Questions for the pharmacist: No    Confirmed patient received Welcome Packet with first shipment. The patient will receive a drug information handout for each medication shipped and additional FDA Medication Guides as required.       DISEASE/MEDICATION-SPECIFIC INFORMATION        N/A    SPECIALTY MEDICATION ADHERENCE     Medication Adherence    Patient reported X missed doses in the last month: 0  Specialty Medication: prograf 1mg   Patient is on additional specialty medications: No  Adherence tools used: patient uses a pill box to manage medications  Support network for adherence: family member            Prograf 1mg : 8 days worth of medication on hand.          SHIPPING     Shipping address confirmed in Epic.     Delivery Scheduled: Yes, Expected medication delivery date: 06/15/20.     Medication will be delivered via UPS to the prescription address in Epic WAM.    Tamara Morris   [facility] Specialty Technician

## 2020-06-10 NOTE — Unmapped (Signed)
Pt request for RX Refill    PROGRAF 1 mg capsule

## 2020-06-19 DIAGNOSIS — R7301 Impaired fasting glucose: Secondary | ICD-10-CM | POA: Diagnosis not present

## 2020-06-19 DIAGNOSIS — R7303 Prediabetes: Secondary | ICD-10-CM | POA: Diagnosis not present

## 2020-06-19 DIAGNOSIS — E875 Hyperkalemia: Secondary | ICD-10-CM | POA: Diagnosis not present

## 2020-06-19 DIAGNOSIS — Z944 Liver transplant status: Secondary | ICD-10-CM | POA: Diagnosis not present

## 2020-06-19 DIAGNOSIS — N1831 Chronic kidney disease, stage 3a: Secondary | ICD-10-CM | POA: Diagnosis not present

## 2020-06-19 DIAGNOSIS — E669 Obesity, unspecified: Secondary | ICD-10-CM | POA: Diagnosis not present

## 2020-06-19 DIAGNOSIS — E559 Vitamin D deficiency, unspecified: Secondary | ICD-10-CM | POA: Diagnosis not present

## 2020-06-19 DIAGNOSIS — E876 Hypokalemia: Secondary | ICD-10-CM | POA: Diagnosis not present

## 2020-06-19 DIAGNOSIS — E7849 Other hyperlipidemia: Secondary | ICD-10-CM | POA: Diagnosis not present

## 2020-06-19 DIAGNOSIS — I129 Hypertensive chronic kidney disease with stage 1 through stage 4 chronic kidney disease, or unspecified chronic kidney disease: Secondary | ICD-10-CM | POA: Diagnosis not present

## 2020-06-19 DIAGNOSIS — E782 Mixed hyperlipidemia: Secondary | ICD-10-CM | POA: Diagnosis not present

## 2020-06-26 DIAGNOSIS — R7301 Impaired fasting glucose: Secondary | ICD-10-CM | POA: Diagnosis not present

## 2020-06-26 DIAGNOSIS — Z6833 Body mass index (BMI) 33.0-33.9, adult: Secondary | ICD-10-CM | POA: Diagnosis not present

## 2020-06-26 DIAGNOSIS — K580 Irritable bowel syndrome with diarrhea: Secondary | ICD-10-CM | POA: Diagnosis not present

## 2020-06-26 DIAGNOSIS — E782 Mixed hyperlipidemia: Secondary | ICD-10-CM | POA: Diagnosis not present

## 2020-06-26 DIAGNOSIS — F411 Generalized anxiety disorder: Secondary | ICD-10-CM | POA: Diagnosis not present

## 2020-06-26 DIAGNOSIS — I129 Hypertensive chronic kidney disease with stage 1 through stage 4 chronic kidney disease, or unspecified chronic kidney disease: Secondary | ICD-10-CM | POA: Diagnosis not present

## 2020-06-26 DIAGNOSIS — M542 Cervicalgia: Secondary | ICD-10-CM | POA: Diagnosis not present

## 2020-06-26 DIAGNOSIS — E875 Hyperkalemia: Secondary | ICD-10-CM | POA: Diagnosis not present

## 2020-06-26 DIAGNOSIS — R7303 Prediabetes: Secondary | ICD-10-CM | POA: Diagnosis not present

## 2020-06-26 DIAGNOSIS — N1831 Chronic kidney disease, stage 3a: Secondary | ICD-10-CM | POA: Diagnosis not present

## 2020-06-26 DIAGNOSIS — Z944 Liver transplant status: Secondary | ICD-10-CM | POA: Diagnosis not present

## 2020-06-26 DIAGNOSIS — E669 Obesity, unspecified: Secondary | ICD-10-CM | POA: Diagnosis not present

## 2020-07-01 DIAGNOSIS — I10 Essential (primary) hypertension: Secondary | ICD-10-CM | POA: Diagnosis not present

## 2020-07-01 DIAGNOSIS — F419 Anxiety disorder, unspecified: Secondary | ICD-10-CM | POA: Diagnosis not present

## 2020-07-01 DIAGNOSIS — R7301 Impaired fasting glucose: Secondary | ICD-10-CM | POA: Diagnosis not present

## 2020-07-01 DIAGNOSIS — J069 Acute upper respiratory infection, unspecified: Secondary | ICD-10-CM | POA: Diagnosis not present

## 2020-07-01 DIAGNOSIS — Z6834 Body mass index (BMI) 34.0-34.9, adult: Secondary | ICD-10-CM | POA: Diagnosis not present

## 2020-07-01 DIAGNOSIS — E782 Mixed hyperlipidemia: Secondary | ICD-10-CM | POA: Diagnosis not present

## 2020-07-01 DIAGNOSIS — G8929 Other chronic pain: Secondary | ICD-10-CM | POA: Diagnosis not present

## 2020-07-01 DIAGNOSIS — I129 Hypertensive chronic kidney disease with stage 1 through stage 4 chronic kidney disease, or unspecified chronic kidney disease: Secondary | ICD-10-CM | POA: Diagnosis not present

## 2020-07-01 DIAGNOSIS — Z6835 Body mass index (BMI) 35.0-35.9, adult: Secondary | ICD-10-CM | POA: Diagnosis not present

## 2020-07-01 DIAGNOSIS — K580 Irritable bowel syndrome with diarrhea: Secondary | ICD-10-CM | POA: Diagnosis not present

## 2020-07-06 DIAGNOSIS — I10 Essential (primary) hypertension: Secondary | ICD-10-CM | POA: Diagnosis not present

## 2020-07-08 NOTE — Unmapped (Addendum)
Received call from pt asking if she can get the 4th COVID vaccine to which this coordinator agreed. Pt also wanted to know if she could take Gabapentin. Informed pt it is acceptable in transplant recipients and asked pt  to notify this coordinator when she starts it to update med list. Pt expressed concerns over elevated BP which is being managed by PCP. Pt mentioned there was talk of possibly adding another BP medication and she was upset about it. She also expressed anxiety of her daughter who was recently diagnosed with COVID and hospitalized but seems to be stable. Explained to pt that the anxiety or worrying could also contribute to BP elevation. Encouraged pt to work with PCP on monitoring BP to see if it improves and if it doesn't it would be safer to take an additional medication than endure long term effects of poorly controlled BP. Suggested seeing a therapist for anxiety which pt did not think she needed at this time. She reports having a good support system through her long time friend and her other daughter. Explained to pt that while she is surrounded by friends and family who truly love her, they may not know how to support her from a psychological standpoint and that is where a therapist comes in. She verbalized understanding and  promised if she felt overwhelmed she would seek professional help. Pt expressed how much she appreciates this coordinator and always feels like things are ok after discussing them with her. Informed pt the feeling is mutual as she is always very pleasant and appreciative.

## 2020-07-10 MED FILL — PROGRAF 1 MG CAPSULE: ORAL | 30 days supply | Qty: 60 | Fill #1

## 2020-07-10 NOTE — Unmapped (Signed)
[facility] Specialty Pharmacy Refill Coordination Note    Specialty Medication(s) to be Shipped:   Transplant: Prograf 1mg     Other medication(s) to be shipped: No additional medications requested for fill at this time     [name], DOB: 1941-01-24  Phone: [phone] (home)       All above HIPAA information was verified with patient.     Was a Nurse, learning disability used for this call? No    Completed refill call assessment today to schedule patient's medication shipment from the [facility] [phone]).       Specialty medication(s) and dose(s) confirmed: Regimen is correct and unchanged.   Changes to medications: Hana reports no changes at this time.  Changes to insurance: No  Questions for the pharmacist: No    Confirmed patient received a Conservation officer, historic buildings and a Surveyor, mining with first shipment. The patient will receive a drug information handout for each medication shipped and additional FDA Medication Guides as required.       DISEASE/MEDICATION-SPECIFIC INFORMATION        N/A    SPECIALTY MEDICATION ADHERENCE     Medication Adherence    Patient reported X missed doses in the last month: 0  Specialty Medication: Prograf 1mg   Patient is on additional specialty medications: No  Adherence tools used: patient uses a pill box to manage medications  Support network for adherence: family member        Prograf 1 mg: 4 days of medicine on hand     SHIPPING     Shipping address confirmed in Epic.     Delivery Scheduled: Yes, Expected medication delivery date: 07/13/2020.     Medication will be delivered via UPS to the prescription address in Epic WAM.    Lorelei Pont [facility] Specialty Technician

## 2020-07-23 DIAGNOSIS — R202 Paresthesia of skin: Secondary | ICD-10-CM | POA: Diagnosis not present

## 2020-07-23 DIAGNOSIS — I10 Essential (primary) hypertension: Secondary | ICD-10-CM | POA: Diagnosis not present

## 2020-07-28 NOTE — Unmapped (Signed)
[facility] Pharm[facility] Clinical Assessment & Refill Coordination Note    Miki Labuda, DOB: 05-Jan-1941  Phone: [phone] (home)     All above HIPAA information was verified with patient.     Was a Nurse, learning disability used for this call? No    Specialty Medication(s):   Transplant: Prograf 1mg      Current Outpatient Medications   Medication Sig Dispense Refill   ??? acetaminophen (TYLENOL) 325 MG tablet Take 500 mg by mouth every six (6) hours as needed for pain.      ??? aspirin (ECOTRIN) 81 MG tablet Take 81 mg by mouth daily.     ??? cholecalciferol, vitamin D3 25 mcg, 1,000 units,, (CHOLECALCIFEROL-25 MCG, 1,000 UNIT,) 1,000 unit (25 mcg) tablet Take 25 mcg by mouth daily.     ??? hydrALAZINE (APRESOLINE) 25 MG tablet Take 25 mg by mouth Two (2) times a day.     ??? loperamide (IMODIUM A-D) 2 mg tablet Take 2 mg by mouth. Frequency:PRN   Dosage:2   MG  Instructions:  Note:Dose: 2MG      ??? LORazepam (ATIVAN) 0.5 MG tablet Take 0.5 mg by mouth. Frequency:PRN   Dosage:0.5   MG  Instructions:  Note:Dose: 0.5MG      ??? losartan (COZAAR) 100 MG tablet Take 100 mg by mouth daily.     ??? olmesartan (BENICAR) 20 MG tablet Take 20 mg by mouth daily.     ??? PROGRAF 1 mg capsule Take 1 capsule by mouth twice daily 60 capsule 11   ??? simvastatin (ZOCOR) 10 MG tablet Take 10 mg by mouth nightly.     ??? verapamil (CALAN-SR) 240 MG CR tablet        No current facility-administered medications for this visit.        Changes to medications: hydralazine increased to 25mg  tid - update this on epic med list    Allergies   Allergen Reactions   ??? Codeine Other (See Comments)   ??? Hydromorphone Other (See Comments)   ??? Meperidine Other (See Comments)       Changes to allergies: No    SPECIALTY MEDICATION ADHERENCE     Prograf 1mg   : 12 days of medicine on hand     Medication Adherence    Patient reported X missed doses in the last month: 0  Specialty Medication: prograf 1mg   Adherence tools used: patient uses a pill box to manage medications  Support network for adherence: family member          Specialty medication(s) dose(s) confirmed: Regimen is correct and unchanged.     Are there any concerns with adherence? No    Adherence counseling provided? Not needed    CLINICAL MANAGEMENT AND INTERVENTION      Clinical Benefit Assessment:    Do you feel the medicine is effective or helping your condition? Yes    Clinical Benefit counseling provided? Not needed    Adverse Effects Assessment:    Are you experiencing any side effects? No    Are you experiencing difficulty administering your medicine? No    Quality of Life Assessment:    How many days over the past month did your transplant  keep you from your normal activities? For example, brushing your teeth or getting up in the morning. 0    Have you discussed this with your provider? Not needed    Acute Infection Status:    Acute infections noted within Epic:  No active infections  Patient reported  infection: None    Therapy Appropriateness:    Is therapy appropriate? Yes, therapy is appropriate and should be continued    DISEASE/MEDICATION-SPECIFIC INFORMATION      N/A    PATIENT SPECIFIC NEEDS     - Does the patient have any physical, cognitive, or cultural barriers? No    - Is the patient high risk? No    - Does the patient require a Care Management Plan? No     - Does the patient require physician intervention or other additional services (i.e. nutrition, smoking cessation, social work)? No      SHIPPING     Specialty Medication(s) to be Shipped:   Transplant: Prograf 1mg     Other medication(s) to be shipped: No additional medications requested for fill at this time     Changes to insurance: No    Delivery Scheduled: Yes, Expected medication delivery date: 08/04/2020.     Medication will be delivered via UPS to the confirmed prescription address in [facility].    The patient will receive a drug information handout for each medication shipped and additional FDA Medication Guides as required. Verified that patient has previously received a Conservation officer, historic buildings and a Surveyor, mining.    All of the patient's questions and concerns have been addressed.    Thad Ranger   [facility] Specialty Pharmacist

## 2020-08-03 DIAGNOSIS — H01002 Unspecified blepharitis right lower eyelid: Secondary | ICD-10-CM | POA: Diagnosis not present

## 2020-08-03 DIAGNOSIS — H01001 Unspecified blepharitis right upper eyelid: Secondary | ICD-10-CM | POA: Diagnosis not present

## 2020-08-03 DIAGNOSIS — H26491 Other secondary cataract, right eye: Secondary | ICD-10-CM | POA: Diagnosis not present

## 2020-08-03 DIAGNOSIS — H353131 Nonexudative age-related macular degeneration, bilateral, early dry stage: Secondary | ICD-10-CM | POA: Diagnosis not present

## 2020-08-03 MED FILL — PROGRAF 1 MG CAPSULE: ORAL | 30 days supply | Qty: 60 | Fill #2

## 2020-08-19 DIAGNOSIS — J811 Chronic pulmonary edema: Secondary | ICD-10-CM | POA: Diagnosis not present

## 2020-08-19 DIAGNOSIS — R059 Cough, unspecified: Secondary | ICD-10-CM | POA: Diagnosis not present

## 2020-08-19 DIAGNOSIS — J069 Acute upper respiratory infection, unspecified: Secondary | ICD-10-CM | POA: Diagnosis not present

## 2020-08-19 DIAGNOSIS — R509 Fever, unspecified: Secondary | ICD-10-CM | POA: Diagnosis not present

## 2020-08-19 NOTE — Unmapped (Signed)
Received on-call page from pt's daughter Marylene Land who reports that the patient has had cough/cold symptoms. She was seen by her PCP and was covid negative. Pt was scripted prednisone and antibiotics which the daughter wasn't able to name. Marylene Land wanted to make sure that these medications are not contraindicated. Advised to check what antibiotics were prescribed. Note routed to primary TNC.

## 2020-08-25 NOTE — Unmapped (Signed)
[facility] Specialty Pharmacy Refill Coordination Note    Specialty Medication(s) to be Shipped:   Transplant: Prograf 1mg     Other medication(s) to be shipped: No additional medications requested for fill at this time     Tamara Morris, DOB: 1940-05-03  Phone: [phone] (home)       All above HIPAA information was verified with patient.     Was a Nurse, learning disability used for this call? No    Completed refill call assessment today to schedule patient's medication shipment from the [facility] [phone]).  All relevant notes have been reviewed.     Specialty medication(s) and dose(s) confirmed: Regimen is correct and unchanged.   Changes to medications: Tida reports no changes at this time.  Changes to insurance: No  New side effects reported not previously addressed with a pharmacist or physician: None reported  Questions for the pharmacist: No    Confirmed patient received a Conservation officer, historic buildings and a Surveyor, mining with first shipment. The patient will receive a drug information handout for each medication shipped and additional FDA Medication Guides as required.       DISEASE/MEDICATION-SPECIFIC INFORMATION        N/A    SPECIALTY MEDICATION ADHERENCE     Medication Adherence    Patient reported X missed doses in the last month: 0  Specialty Medication: PROGRAF 1 mg capsule  Patient is on additional specialty medications: No  Adherence tools used: patient uses a pill box to manage medications  Support network for adherence: family member              Were doses missed due to medication being on hold? No    Prograf 1mg : 10 days worth of medication on hand.      REFERRAL TO PHARMACIST     Referral to the pharmacist: Not needed      [facility]     Shipping address confirmed in Epic.     Delivery Scheduled: Yes, Expected medication delivery date: 09/02/20.     Medication will be delivered via UPS to the prescription address in Epic WAM.    [place] A Alexanderia Gorby   [facility] Specialty Technician

## 2020-08-25 NOTE — Unmapped (Signed)
Received vm from pt  requesting a return call. Called back and pt reported she is on her last doses of amoxicillin and  prednisone for a cold that took a long time to recover. Pt reports she feels much better after the treatment. Pt reports starting on Gabapentin 100mg  nightly for neuropathy. Updated medication list. Pt will get labs in the next week.

## 2020-08-31 MED FILL — PROGRAF 1 MG CAPSULE: ORAL | 30 days supply | Qty: 60 | Fill #3

## 2020-09-07 ENCOUNTER — Ambulatory Visit: Admit: 2020-09-07 | Discharge: 2020-09-08 | Payer: MEDICARE

## 2020-09-07 DIAGNOSIS — Z944 Liver transplant status: Secondary | ICD-10-CM | POA: Diagnosis not present

## 2020-09-07 DIAGNOSIS — Z79899 Other long term (current) drug therapy: Secondary | ICD-10-CM | POA: Diagnosis not present

## 2020-09-07 LAB — COMPREHENSIVE METABOLIC PANEL
ALBUMIN: 3.8 g/dL (ref 3.5–5.0)
ALKALINE PHOSPHATASE: 75 U/L (ref 46–116)
ALT (SGPT): 28 U/L (ref 12–78)
ANION GAP: 6 mmol/L (ref 3–11)
AST (SGOT): 16 U/L (ref 15–40)
BILIRUBIN TOTAL: 0.5 mg/dL (ref 0.3–1.2)
BLOOD UREA NITROGEN: 25 mg/dL — ABNORMAL HIGH (ref 8–20)
BUN / CREAT RATIO: 16
CALCIUM: 9.1 mg/dL (ref 8.5–10.1)
CHLORIDE: 105 mmol/L (ref 98–107)
CO2: 28.5 mmol/L (ref 21.0–32.0)
CREATININE: 1.52 mg/dL — ABNORMAL HIGH (ref 0.60–1.10)
EGFR CKD-EPI (2021) FEMALE: 35 mL/min/{1.73_m2} — ABNORMAL LOW (ref >=60)
GLUCOSE RANDOM: 106 mg/dL — ABNORMAL HIGH (ref 70–99)
POTASSIUM: 5.1 mmol/L — ABNORMAL HIGH (ref 3.5–5.0)
PROTEIN TOTAL: 6.8 g/dL (ref 6.0–8.0)
SODIUM: 139 mmol/L (ref 135–145)

## 2020-09-07 LAB — CBC W/ AUTO DIFF
BASOPHILS ABSOLUTE COUNT: 0.1 10*9/L (ref 0.0–0.2)
BASOPHILS RELATIVE PERCENT: 0.9 %
EOSINOPHILS ABSOLUTE COUNT: 0.4 10*9/L (ref 0.0–0.4)
EOSINOPHILS RELATIVE PERCENT: 4.4 %
HEMATOCRIT: 38.7 % (ref 34.0–44.0)
HEMOGLOBIN: 12.1 g/dL (ref 11.5–15.0)
LYMPHOCYTES ABSOLUTE COUNT: 2.1 10*9/L (ref 0.7–4.5)
LYMPHOCYTES RELATIVE PERCENT: 26.4 %
MEAN CORPUSCULAR HEMOGLOBIN CONC: 31.3 g/dL — ABNORMAL LOW (ref 32.0–36.0)
MEAN CORPUSCULAR HEMOGLOBIN: 30.3 pg (ref 27.0–34.0)
MEAN CORPUSCULAR VOLUME: 97 fL (ref 80.0–98.0)
MEAN PLATELET VOLUME: 9.1 fL (ref 7.4–10.4)
MONOCYTES ABSOLUTE COUNT: 0.7 10*9/L (ref 0.1–1.0)
MONOCYTES RELATIVE PERCENT: 8.7 %
NEUTROPHILS ABSOLUTE COUNT: 4.7 10*9/L (ref 1.8–7.8)
NEUTROPHILS RELATIVE PERCENT: 59.3 %
PLATELET COUNT: 256 10*9/L (ref 140–415)
RED BLOOD CELL COUNT: 3.99 10*12/L (ref 3.80–5.10)
RED CELL DISTRIBUTION WIDTH: 13.3 % (ref 11.5–14.5)
WBC ADJUSTED: 7.9 10*9/L (ref 4.0–10.5)

## 2020-09-07 LAB — BILIRUBIN, DIRECT: BILIRUBIN DIRECT: 0.13 mg/dL (ref 0.10–0.30)

## 2020-09-07 LAB — PHOSPHORUS: PHOSPHORUS: 4 mg/dL (ref 2.4–5.1)

## 2020-09-07 LAB — MAGNESIUM: MAGNESIUM: 1.9 mg/dL (ref 1.6–2.6)

## 2020-09-07 LAB — GAMMA GT: GAMMA GLUTAMYL TRANSFERASE: 44 U/L — ABNORMAL HIGH (ref 0–38)

## 2020-09-08 LAB — TACROLIMUS LEVEL, TROUGH: TACROLIMUS, TROUGH: 4.9 ng/mL — ABNORMAL LOW (ref 5.0–15.0)

## 2020-09-09 NOTE — Unmapped (Signed)
Called pt with 6/6 results which were WNL except creatinine which is slightly more elevated. Went over hydration recommendations. Pt will repeat labs in a month instead of waiting for 3 months. Sent new standing lab order through [employer].

## 2020-09-09 NOTE — Unmapped (Signed)
Called pt to schedule annual appt. Listened and talked with pt for 30 minutes and she asked this tpa to call her daughter to find out what day works for her for the annual appt since she will drive her. Explained new post liver annual protocol and pt was happy with the details. Told her this tpa would call daughter and she verbalized understanding.    Left vm for pt's daughter. Asked for call back and left call-back number/

## 2020-09-14 NOTE — Unmapped (Signed)
Pt's daughter returned call re scheduling annual for pt and left vm on 6/9 when this tpa was out of office. Called her back an d left vm. Asked for call back and left call-back number.

## 2020-09-15 NOTE — Unmapped (Signed)
Pt's daughter Tamara Morris returned call from yesterday and pt scheduled for 7/21. She said pt told her about new post liver annual and lab protocols. Daughter said pt will get labs week before and asked that appt letter be sent to her home. She provided address, expressed appreciation from her and pt for primary coord and this tpa and verbalized understanding of all discussed.    Appt letter sent thru epic mail.

## 2020-09-18 DIAGNOSIS — Z23 Encounter for immunization: Secondary | ICD-10-CM | POA: Diagnosis not present

## 2020-10-06 NOTE — Unmapped (Signed)
[facility] Pharm[facility] Refill Coordination Note    Specialty Medication(s) to be Shipped:   Transplant: Prograf 1mg     Other medication(s) to be shipped: No additional medications requested for fill at this time     Tamara Morris, DOB: [birth year]  Phone: [phone] (home)       All above HIPAA information was verified with patient.     Was a Nurse, learning disability used for this call? No    Completed refill call assessment today to schedule patient's medication shipment from the [facility] [phone]).  All relevant notes have been reviewed.     Specialty medication(s) and dose(s) confirmed: Regimen is correct and unchanged.   Changes to medications: Mashawn reports no changes at this time.  Changes to insurance: No  New side effects reported not previously addressed with a pharmacist or physician: None reported  Questions for the pharmacist: No    Confirmed patient received a Conservation officer, historic buildings and a Surveyor, mining with first shipment. The patient will receive a drug information handout for each medication shipped and additional FDA Medication Guides as required.       DISEASE/MEDICATION-SPECIFIC INFORMATION        N/A    SPECIALTY MEDICATION ADHERENCE     Medication Adherence    Patient reported X missed doses in the last month: 0  Specialty Medication: prograf 1mg   Patient is on additional specialty medications: No  Patient is on more than two specialty medications: No  Informant: patient  Adherence tools used: patient uses a pill box to manage medications  Support network for adherence: family member              Were doses missed due to medication being on hold? No    Prograf 1 mg: 5 days of medicine on hand       REFERRAL TO PHARMACIST     Referral to the pharmacist: Not needed      [facility]     Shipping address confirmed in Epic.     Delivery Scheduled: Yes, Expected medication delivery date: 10/08/20.     Medication will be delivered via UPS to the prescription address in Epic WAM.    Tamara Morris   [facility] Specialty Technician

## 2020-10-07 MED FILL — PROGRAF 1 MG CAPSULE: ORAL | 30 days supply | Qty: 60 | Fill #4

## 2020-10-13 ENCOUNTER — Ambulatory Visit: Admit: 2020-10-13 | Discharge: 2020-10-14 | Payer: MEDICARE

## 2020-10-13 DIAGNOSIS — Z79899 Other long term (current) drug therapy: Secondary | ICD-10-CM | POA: Diagnosis not present

## 2020-10-13 DIAGNOSIS — Z944 Liver transplant status: Secondary | ICD-10-CM | POA: Diagnosis not present

## 2020-10-13 LAB — CBC W/ AUTO DIFF
BASOPHILS ABSOLUTE COUNT: 0.1 10*9/L (ref 0.0–0.2)
BASOPHILS RELATIVE PERCENT: 0.6 %
EOSINOPHILS ABSOLUTE COUNT: 0.3 10*9/L (ref 0.0–0.4)
EOSINOPHILS RELATIVE PERCENT: 3.7 %
HEMATOCRIT: 38.2 % (ref 34.0–44.0)
HEMOGLOBIN: 12.2 g/dL (ref 11.5–15.0)
LYMPHOCYTES ABSOLUTE COUNT: 2 10*9/L (ref 0.7–4.5)
LYMPHOCYTES RELATIVE PERCENT: 23.9 %
MEAN CORPUSCULAR HEMOGLOBIN CONC: 31.9 g/dL — ABNORMAL LOW (ref 32.0–36.0)
MEAN CORPUSCULAR HEMOGLOBIN: 31 pg (ref 27.0–34.0)
MEAN CORPUSCULAR VOLUME: 97.2 fL (ref 80.0–98.0)
MEAN PLATELET VOLUME: 9.4 fL (ref 7.4–10.4)
MONOCYTES ABSOLUTE COUNT: 0.9 10*9/L (ref 0.1–1.0)
MONOCYTES RELATIVE PERCENT: 11 %
NEUTROPHILS ABSOLUTE COUNT: 5.1 10*9/L (ref 1.8–7.8)
NEUTROPHILS RELATIVE PERCENT: 60.4 %
PLATELET COUNT: 241 10*9/L (ref 140–415)
RED BLOOD CELL COUNT: 3.93 10*12/L (ref 3.80–5.10)
RED CELL DISTRIBUTION WIDTH: 13.4 % (ref 11.5–14.5)
WBC ADJUSTED: 8.4 10*9/L (ref 4.0–10.5)

## 2020-10-13 LAB — PHOSPHORUS: PHOSPHORUS: 4.1 mg/dL (ref 2.4–5.1)

## 2020-10-13 LAB — COMPREHENSIVE METABOLIC PANEL
ALBUMIN: 4.2 g/dL (ref 3.5–5.0)
ALKALINE PHOSPHATASE: 83 U/L (ref 46–116)
ALT (SGPT): 32 U/L (ref 12–78)
ANION GAP: 8 mmol/L (ref 3–11)
AST (SGOT): 23 U/L (ref 15–40)
BILIRUBIN TOTAL: 0.6 mg/dL (ref 0.3–1.2)
BLOOD UREA NITROGEN: 24 mg/dL — ABNORMAL HIGH (ref 8–20)
BUN / CREAT RATIO: 18
CALCIUM: 9.7 mg/dL (ref 8.5–10.1)
CHLORIDE: 104 mmol/L (ref 98–107)
CO2: 28.3 mmol/L (ref 21.0–32.0)
CREATININE: 1.3 mg/dL — ABNORMAL HIGH (ref 0.60–1.10)
EGFR CKD-EPI (2021) FEMALE: 42 mL/min/{1.73_m2} — ABNORMAL LOW (ref >=60)
GLUCOSE RANDOM: 111 mg/dL (ref 70–179)
POTASSIUM: 4.9 mmol/L (ref 3.5–5.0)
PROTEIN TOTAL: 7.3 g/dL (ref 6.0–8.0)
SODIUM: 140 mmol/L (ref 135–145)

## 2020-10-13 LAB — GAMMA GT: GAMMA GLUTAMYL TRANSFERASE: 46 U/L — ABNORMAL HIGH (ref 0–38)

## 2020-10-13 LAB — BILIRUBIN, DIRECT: BILIRUBIN DIRECT: 0.19 mg/dL (ref 0.10–0.30)

## 2020-10-13 LAB — MAGNESIUM: MAGNESIUM: 2 mg/dL (ref 1.6–2.6)

## 2020-10-14 LAB — TACROLIMUS LEVEL, TIMED: TACROLIMUS BLOOD: 3.5 ng/mL

## 2020-10-22 ENCOUNTER — Ambulatory Visit: Admit: 2020-10-22 | Discharge: 2020-10-23 | Payer: MEDICARE

## 2020-10-22 DIAGNOSIS — Z944 Liver transplant status: Secondary | ICD-10-CM | POA: Diagnosis not present

## 2020-10-22 NOTE — Unmapped (Signed)
FOLLOW UP ANNUAL LIVER CLINIC NOTE     Patient Name: Tamara Morris  Medical Record Number: [id number]  Date of Service: 10/22/2020    Referring Physician: Stephens Shire   Current complaint: Follow up Annual Liver    Assessment/Plan:     Saron Vanorman is a 80 y.o. 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  Recent testing/images stable, immunosuppressive levels were within goal range (~3).  Plan to continueTacrolimus 1mg  BID.    NSAID use, very sparingly - a few times a month at most for uncontrollable back pain. Recommended more hydration on days she uses these; look into a TENS unit for pain relief. Encouraged her to continue close follow up with local pain clinic.    HEALTH MAINTENANCE:   - Dermatology: This patient is at increased risk for the development of skin cancers due to immunosuppressant medications. We recommend yearly surveillance.  - Mammogram: annual   - Dental: annual  - Mental health: no issues  Immunization History   Administered Date(s) Administered   ??? COVID-19 VACCINE,MRNA(MODERNA)(PF)(IM) 05/29/2019, 06/26/2019, 11/22/2019, 05/16/2020   ??? SHINGRIX-ZOSTER VACCINE (HZV), RECOMBINANT,SUB-UNIT,ADJUVANTED IM 11/25/2016   ??? TdaP 01/27/2015     Singrix #1 today, will receive #2 with PCP   Will receive flu shot this fall, and will send [facility] records of pneumonia.    Return to clinic: 1 year  Labs: monthly    I spent a total of 40 minutes of which 50% was spent in counseling and coordination of care.    Subjective:     HPI: Tamara Morris is a 79 y.o. female who underwent liver transplant on 03/06/1993 for cryptogenic cirrhosis c/b graft nonfunction s/p second transplant 03/08/1993 who presents for annual evaluation. She has had minimal changes to health since previous visit.     She was diagnosed with moderate r carotid stenosis with less than 50% l carotid stenosis after assessed to have a carotid bruit. She is asymptomatic and on ASA and simvastatin with good control. Denies chest pain, SOB, N/V or abdominal pain. She states she has IBS and frequent stooling that does not cause her any problems. She denies acute complaint today, but does report persistent chronic back pain, largely unchanged.     No past medical history on file.    No past surgical history on file.    No family history on file.    Social History     Socioeconomic History   ??? Marital status: Divorced   Tobacco Use   ??? Smoking status: Former Smoker     Types: Cigarettes     Quit date: 11/02/1988     Years since quitting: 31.9   ??? Smokeless tobacco: Never Used       REVIEW OF SYSTEMS:   The balance of 10/12 systems is negative with the exception of HPI.    Objective:     MEDICATIONS:  Allergies   Allergen Reactions   ??? Codeine Other (See Comments)   ??? Hydromorphone Other (See Comments)   ??? Meperidine Other (See Comments)       Current Outpatient Medications   Medication Sig Dispense Refill   ??? acetaminophen (TYLENOL) 325 MG tablet Take 500 mg by mouth every six (6) hours as needed for pain.      ??? aspirin (ECOTRIN) 81 MG tablet Take 81 mg by mouth daily.     ??? cholecalciferol, vitamin D3 25 mcg, 1,000 units,, (CHOLECALCIFEROL-25  MCG, 1,000 UNIT,) 1,000 unit (25 mcg) tablet Take 25 mcg by mouth daily.     ??? gabapentin (NEURONTIN) 100 MG capsule Take 100 mg by mouth nightly.     ??? hydrALAZINE (APRESOLINE) 25 MG tablet Take 25 mg by mouth Three (3) times a day.      ??? loperamide (IMODIUM A-D) 2 mg tablet Take 2 mg by mouth. Frequency:PRN   Dosage:2   MG  Instructions:  Note:Dose: 2MG      ??? LORazepam (ATIVAN) 0.5 MG tablet Take 0.5 mg by mouth. Frequency:PRN   Dosage:0.5   MG  Instructions:  Note:Dose: 0.5MG      ??? losartan (COZAAR) 100 MG tablet Take 100 mg by mouth daily.     ??? olmesartan (BENICAR) 20 MG tablet Take 20 mg by mouth daily.     ??? PROGRAF 1 mg capsule Take 1 capsule by mouth twice daily 60 capsule 11   ??? simvastatin (ZOCOR) 10 MG tablet Take 10 mg by mouth nightly.     ??? verapamil (CALAN-SR) 240 MG CR tablet        No current facility-administered medications for this visit.         PHYSICAL EXAM:  BP 139/72 (BP Site: L Arm, BP Position: Sitting, BP Cuff Size: Medium)  - Pulse 98  - Temp 37.1 ??C (98.7 ??F) (Temporal)  - Wt 85.2 kg (187 lb 12.8 oz)  - SpO2 98%  - BMI 31.75 kg/m??     General Appearance:  NAD, well appearing and well nourished.   HEENT:  [place]/AT. Well hydrated moist mucous membranes of the oral cavity. No scleral icterus. No cervical lymphadenopathy.   Pulmonary:    Normal respiratory effort. CTAB, without wheezes/crackles/rhonchi. Good air movement.    Cardiovascular:  Regular rate and rhythm, no murmur noted.   Extremities No edema.    Abdomen:   Normoactive bowel sounds, abdomen soft, non-tender and not distended, no Hepatosplenomegaly or masses. Abdominal scar well healed without hernia.    Musculoskeletal: No joint tenderness, full ROM. Normal gait.    Skin: Skin color, texture, turgor normal, no rashes or lesions. Skin tag to R upper chest wall ~0.5cm diameter   Neurologic: Grossly intact.   Psychiatric: Judgement and insight appropriate.        LAB RESULTS:  All lab results last 24 hours:    No results found for this or any previous visit (from the past 48 hour(s)).      IMAGING:  Xr Chest 2 Views  Result Date: 10/20/2017   Radiographically clear lungs. No pleural effusion or pneumothorax. Unremarkable cardiomediastinal silhouette.     US Liver Transplant  Result Date: 10/20/2017  - Patent hepatic transplant vasculature, with normal flow at the main portal vein anastomosis.   - Mildly elevated resistive indices in the hepatic transplant arteries, similar to prior.      _______________________________________________        Gertie Fey, DNP, APRN, FNP-C  [facility]  [address]  [place], [place]  [address] Result Value Ref Range    Bilirubin, Direct 0.19 0.10 - 0.30 mg/dL   Gamma GT (GGT)   Result Value Ref Range    GGT 46 (H) 0 - 38 U/L   Tacrolimus Level, Timed ([place])   Result Value Ref Range    Tacrolimus, Timed 3.5 ng/mL   CBC w/ Differential   Result Value Ref Range    WBC 8.4 4.0 - 10.5 10*9/L  RBC 3.93 3.80 - 5.10 10*12/L    HGB 12.2 11.5 - 15.0 g/dL    HCT 16.1 09.6 - 04.5 %    MCV 97.2 80.0 - 98.0 fL    MCH 31.0 27.0 - 34.0 pg    MCHC 31.9 (L) 32.0 - 36.0 g/dL    RDW 40.9 81.1 - 91.4 %    MPV 9.4 7.4 - 10.4 fL    Platelet 241 140 - 415 10*9/L    Neutrophils % 60.4 %    Lymphocytes % 23.9 %    Monocytes % 11.0 %    Eosinophils % 3.7 %    Basophils % 0.6 %    Absolute Neutrophils 5.1 1.8 - 7.8 10*9/L    Absolute Lymphocytes 2.0 0.7 - 4.5 10*9/L    Absolute Monocytes 0.9 0.1 - 1.0 10*9/L    Absolute Eosinophils 0.3 0.0 - 0.4 10*9/L    Absolute Basophils 0.1 0.0 - 0.2 10*9/L     *Note: Due to a large number of results and/or encounters for the requested time period, some results have not been displayed. A complete set of results can be found in Results Review.             IMAGING:  Xr Chest 2 Views  Result Date: 10/20/2017   Radiographically clear lungs. No pleural effusion or pneumothorax. Unremarkable cardiomediastinal silhouette.     US Liver Transplant  Result Date: 10/20/2017  - Patent hepatic transplant vasculature, with normal flow at the main portal vein anastomosis.   - Mildly elevated resistive indices in the hepatic transplant arteries, similar to prior.      _______________________________________________  Vallery Sa, NP-C  [facility]

## 2020-10-22 NOTE — Unmapped (Addendum)
Blood work looks good.         Vitals:    10/22/20 1244   BP: 139/72   BP Site: L Arm   BP Position: Sitting   BP Cuff Size: Medium   Pulse: 98   Temp: 37.1 ??C (98.7 ??F)   TempSrc: Temporal   SpO2: 98%   Weight: 85.2 kg (187 lb 12.8 oz)        Results for orders placed or performed in visit on 10/13/20   Comprehensive Metabolic Panel   Result Value Ref Range    Sodium 140 135 - 145 mmol/L    Potassium 4.9 3.5 - 5.0 mmol/L    Chloride 104 98 - 107 mmol/L    CO2 28.3 21.0 - 32.0 mmol/L    Anion Gap 8 3 - 11 mmol/L    BUN 24 (H) 8 - 20 mg/dL    Creatinine 1.61 (H) 0.60 - 1.10 mg/dL    BUN/Creatinine Ratio 18     eGFR CKD-EPI (2021) Female 42 (L) >=60 mL/min/1.88m2    Glucose 111 70 - 179 mg/dL    Calcium 9.7 8.5 - 09.6 mg/dL    Albumin 4.2 3.5 - 5.0 g/dL    Total Protein 7.3 6.0 - 8.0 g/dL    Total Bilirubin 0.6 0.3 - 1.2 mg/dL    AST 23 15 - 40 U/L    ALT 32 12 - 78 U/L    Alkaline Phosphatase 83 46 - 116 U/L   Magnesium Level   Result Value Ref Range    Magnesium 2.0 1.6 - 2.6 mg/dL   Phosphorus Level   Result Value Ref Range    Phosphorus 4.1 2.4 - 5.1 mg/dL   Bilirubin, Direct   Result Value Ref Range    Bilirubin, Direct 0.19 0.10 - 0.30 mg/dL   Gamma GT (GGT)   Result Value Ref Range    GGT 46 (H) 0 - 38 U/L   Tacrolimus Level, Timed ([place])   Result Value Ref Range    Tacrolimus, Timed 3.5 ng/mL   CBC w/ Differential   Result Value Ref Range    WBC 8.4 4.0 - 10.5 10*9/L    RBC 3.93 3.80 - 5.10 10*12/L    HGB 12.2 11.5 - 15.0 g/dL    HCT 04.5 40.9 - 81.1 %    MCV 97.2 80.0 - 98.0 fL    MCH 31.0 27.0 - 34.0 pg    MCHC 31.9 (L) 32.0 - 36.0 g/dL    RDW 91.4 78.2 - 95.6 %    MPV 9.4 7.4 - 10.4 fL    Platelet 241 140 - 415 10*9/L    Neutrophils % 60.4 %    Lymphocytes % 23.9 %    Monocytes % 11.0 %    Eosinophils % 3.7 %    Basophils % 0.6 %    Absolute Neutrophils 5.1 1.8 - 7.8 10*9/L    Absolute Lymphocytes 2.0 0.7 - 4.5 10*9/L    Absolute Monocytes 0.9 0.1 - 1.0 10*9/L    Absolute Eosinophils 0.3 0.0 - 0.4 10*9/L Absolute Basophils 0.1 0.0 - 0.2 10*9/L     *Note: Due to a large number of results and/or encounters for the requested time period, some results have not been displayed. A complete set of results can be found in Results Review.

## 2020-11-05 MED FILL — PROGRAF 1 MG CAPSULE: ORAL | 30 days supply | Qty: 60 | Fill #5

## 2020-11-05 NOTE — Unmapped (Signed)
[facility] Specialty Pharmacy Refill Coordination Note    Specialty Medication(s) to be Shipped:   Transplant: Prograf 1mg     Other medication(s) to be shipped: No additional medications requested for fill at this time     Tamara Morris, DOB: [birth year]  Phone: [phone] (home)       All above HIPAA information was verified with patient.     Was a Nurse, learning disability used for this call? No    Completed refill call assessment today to schedule patient's medication shipment from the [facility] [phone]).  All relevant notes have been reviewed.     Specialty medication(s) and dose(s) confirmed: Regimen is correct and unchanged.   Changes to medications: Tamara Morris reports no changes at this time.  Changes to insurance: No  New side effects reported not previously addressed with a pharmacist or physician: None reported  Questions for the pharmacist: No    Confirmed patient received a Conservation officer, historic buildings and a Surveyor, mining with first shipment. The patient will receive a drug information handout for each medication shipped and additional FDA Medication Guides as required.       DISEASE/MEDICATION-SPECIFIC INFORMATION        N/A    SPECIALTY MEDICATION ADHERENCE     Medication Adherence    Patient reported X missed doses in the last month: 0  Specialty Medication: PROGRAF 1 mg capsule  Patient is on additional specialty medications: No  Adherence tools used: patient uses a pill box to manage medications  Support network for adherence: family member              Were doses missed due to medication being on hold? No    Prograf 1 mg: 7 days of medicine on hand       REFERRAL TO PHARMACIST     Referral to the pharmacist: Not needed      [facility]     Shipping address confirmed in Epic.     Delivery Scheduled: Yes, Expected medication delivery date: 11/06/20.     Medication will be delivered via UPS to the prescription address in Epic WAM.    Tera Helper   [facility] Specialty Pharmacist

## 2020-11-20 ENCOUNTER — Other Ambulatory Visit: Payer: Self-pay

## 2020-11-20 DIAGNOSIS — I129 Hypertensive chronic kidney disease with stage 1 through stage 4 chronic kidney disease, or unspecified chronic kidney disease: Secondary | ICD-10-CM | POA: Diagnosis not present

## 2020-11-20 DIAGNOSIS — F419 Anxiety disorder, unspecified: Secondary | ICD-10-CM | POA: Diagnosis not present

## 2020-11-20 DIAGNOSIS — I6523 Occlusion and stenosis of bilateral carotid arteries: Secondary | ICD-10-CM

## 2020-11-25 NOTE — Unmapped (Signed)
[facility] Special[facility] Pharmacy Refill Coordination Note    Specialty Medication(s) to be Shipped:   Transplant: Prograf 1mg     Other medication(s) to be shipped: No additional medications requested for fill at this time     Tamara Morris, DOB: [birth year]  Phone: [phone] (home)       All above HIPAA information was verified with patient.     Was a Nurse, learning disability used for this call? No    Completed refill call assessment today to schedule patient's medication shipment from the [facility] [phone]).  All relevant notes have been reviewed.     Specialty medication(s) and dose(s) confirmed: Regimen is correct and unchanged.   Changes to medications: Arlett reports no changes at this time.  Changes to insurance: No  New side effects reported not previously addressed with a pharmacist or physician: None reported  Questions for the pharmacist: No    Confirmed patient received a Conservation officer, historic buildings and a Surveyor, mining with first shipment. The patient will receive a drug information handout for each medication shipped and additional FDA Medication Guides as required.       DISEASE/MEDICATION-SPECIFIC INFORMATION        N/A    SPECIALTY MEDICATION ADHERENCE     Medication Adherence    Patient reported X missed doses in the last month: 0  Specialty Medication: prograf 1mg   Patient is on additional specialty medications: No  Patient is on more than two specialty medications: No  Any gaps in refill history greater than 2 weeks in the last 3 months: no  Demonstrates understanding of importance of adherence: yes  Informant: patient  Reliability of informant: reliable  Provider-estimated medication adherence level: good  Patient is at risk for Non-Adherence: No  Reasons for non-adherence: no problems identified  Adherence tools used: patient uses a pill box to manage medications  Support network for adherence: family member              Were doses missed due to medication being on hold? No    pograf 1 mg: 12 days of medicine on hand         REFERRAL TO PHARMACIST     Referral to the pharmacist: Not needed      [facility]     Shipping address confirmed in Epic.     Delivery Scheduled: Yes, Expected medication delivery date: 09/02.     Medication will be delivered via UPS to the prescription address in Epic WAM.    Antonietta Barcelona   [facility] Specialty Technician

## 2020-12-02 ENCOUNTER — Ambulatory Visit (HOSPITAL_COMMUNITY)
Admission: RE | Admit: 2020-12-02 | Discharge: 2020-12-02 | Disposition: A | Discharge status: Home / Self Care | Payer: Medicare Other | Source: Ambulatory Visit | Attending: Vascular Surgery | Admitting: Vascular Surgery

## 2020-12-02 ENCOUNTER — Other Ambulatory Visit: Payer: Self-pay

## 2020-12-02 ENCOUNTER — Encounter: Payer: Self-pay | Admitting: Vascular Surgery

## 2020-12-02 ENCOUNTER — Ambulatory Visit (INDEPENDENT_AMBULATORY_CARE_PROVIDER_SITE_OTHER): Payer: Medicare Other | Admitting: Vascular Surgery

## 2020-12-02 VITALS — BP 153/83 | HR 101 | Temp 97.2°F | Resp 16 | Ht 64.5 in | Wt 184.0 lb

## 2020-12-02 DIAGNOSIS — I6523 Occlusion and stenosis of bilateral carotid arteries: Secondary | ICD-10-CM

## 2020-12-02 NOTE — Progress Notes (Signed)
REASON FOR VISIT:   Follow-up of carotid disease.  MEDICAL ISSUES:   CAROTID DISEASE: This patient has asymptomatic bilateral 40 to 59% carotid stenoses.  She understands we would not consider carotid endarterectomy unless she developed new neurologic symptoms or the stenosis progressed to greater than 80%.  She is on aspirin and is on a statin.  I have ordered a follow-up carotid duplex scan in 1 year and we will see her back at that time.  She knows to call sooner if she has problems.   HPI:   Linda Barr is a pleasant 80 y.o. female who I have been following with moderate carotid disease.  She had a 40 to 59% right carotid stenosis and I set her up for 1 year follow-up visit.  Since I saw her last, she denies any history of stroke, TIAs, expressive or receptive aphasia, or amaurosis fugax.  She does occasionally get some tingling in her arms and legs which she attributes to her neuropathy.  She is had no focal weakness or paresthesias however.  She remains on aspirin and a statin.  Past Medical History:  Diagnosis Date   Anxiety    Arthritis    Chronic back pain    Essential hypertension    History of ventricular Bigemy, followed up with cardiologist ,    Gout    History of kidney stones    Hx of liver transplant ([place])    1994, 1995 at [facility]   Hyperlipidemia    Kidney stones    Palpitations     Family History  Problem Relation Age of Onset   Stroke Mother    Stroke Other     SOCIAL HISTORY: Social History   Tobacco Use   Smoking status: Former    Packs/day: 1.00    Years: 12.00    Pack years: 12.00    Types: Cigarettes    Quit date: 04/29/1990    Years since quitting: 30.6   Smokeless tobacco: Former    Quit date: 04/04/1989  Substance Use Topics   Alcohol use: No    Allergies  Allergen Reactions   Codeine Other (See Comments)    Hallucinate, vomiting and sweating   Demerol [Meperidine] Other (See Comments)    Hallucinations, vomiting and sweating.     Dilaudid [Hydromorphone Hcl] Other (See Comments)    Hallucinations, sweating and vomiting   Hydromorphone Other (See Comments)   Morphine And Related Other (See Comments)    Hallucinations, vomiting and sweating    Current Outpatient Medications  Medication Sig Dispense Refill   acetaminophen (TYLENOL) 500 MG tablet Take 500 mg by mouth every 6 (six) hours as needed for mild pain or moderate pain.     aspirin 81 MG tablet Take 81 mg by mouth daily as needed (for palpitations).      hydrALAZINE (APRESOLINE) 25 MG tablet Take 25 mg by mouth 2 (two) times daily.  1   lisinopril (PRINIVIL,ZESTRIL) 20 MG tablet Take 20 mg by mouth daily.     LORazepam (ATIVAN) 0.5 MG tablet Take 0.5 mg by mouth daily as needed.     losartan (COZAAR) 100 MG tablet Take 100 mg by mouth daily.     olmesartan (BENICAR) 20 MG tablet Take 20 mg by mouth daily.  1   simvastatin (ZOCOR) 10 MG tablet TAKE 1 TABLET BY MOUTH EVERY DAY AT NIGHT  0   sodium chloride (OCEAN) 0.65 % SOLN nasal spray Place 1 spray into both nostrils  every 4 (four) hours as needed for congestion.     tacrolimus (PROGRAF) 1 MG capsule Take 1 mg by mouth 2 (two) times daily.     verapamil (CALAN-SR) 240 MG CR tablet Take 240 mg by mouth daily.     No current facility-administered medications for this visit.    REVIEW OF SYSTEMS:  '[X]'$  denotes positive finding, '[ ]'$  denotes negative finding Cardiac  Comments:  Chest pain or chest pressure:    Shortness of breath upon exertion:    Short of breath when lying flat:    Irregular heart rhythm: x       Vascular    Pain in calf, thigh, or hip brought on by ambulation: x   Pain in feet at night that wakes you up from your sleep:     Blood clot in your veins:    Leg swelling:         Pulmonary    Oxygen at home:    Productive cough:     Wheezing:         Neurologic    Sudden weakness in arms or legs:     Sudden numbness in arms or legs:     Sudden onset of difficulty speaking or slurred  speech:    Temporary loss of vision in one eye:     Problems with dizziness:         Gastrointestinal    Blood in stool:     Vomited blood:         Genitourinary    Burning when urinating:     Blood in urine:        Psychiatric    Major depression:         Hematologic    Bleeding problems:    Problems with blood clotting too easily:        Skin    Rashes or ulcers:        Constitutional    Fever or chills:     PHYSICAL EXAM:   Vitals:   12/02/20 0910 12/02/20 0912  BP: (!) 157/90 (!) 153/83  Pulse: (!) 101 (!) 101  Resp: 16   Temp: (!) 97.2 F (36.2 C)   TempSrc: Temporal   SpO2: 95%   Weight: 184 lb (83.5 kg)   Height: 5' 4.5" (1.638 m)     GENERAL: The patient is a well-nourished female, in no acute distress. The vital signs are documented above. CARDIAC: There is a regular rate and rhythm.  VASCULAR: I do not detect carotid bruits. Both feet are warm and well-perfused.   She has no significant lower extremity swelling. PULMONARY: There is good air exchange bilaterally without wheezing or rales. ABDOMEN: Soft and non-tender with normal pitched bowel sounds.  MUSCULOSKELETAL: There are no major deformities or cyanosis. NEUROLOGIC: No focal weakness or paresthesias are detected. SKIN: There are no ulcers or rashes noted. PSYCHIATRIC: The patient has a normal affect.  DATA:    CAROTID DUPLEX: I have independently interpreted her carotid duplex scan today.  On the right side there is a 40 to 59% stenosis.  Right vertebral artery is patent with antegrade flow.  On the left side there is a 40 to 59% stenosis.  Left vertebral artery is patent with antegrade flow.  Deitra Mayo [facility] [phone]

## 2020-12-03 MED FILL — PROGRAF 1 MG CAPSULE: ORAL | 30 days supply | Qty: 60 | Fill #6

## 2020-12-13 DIAGNOSIS — Z20822 Contact with and (suspected) exposure to covid-19: Secondary | ICD-10-CM | POA: Diagnosis not present

## 2020-12-13 DIAGNOSIS — N390 Urinary tract infection, site not specified: Secondary | ICD-10-CM | POA: Diagnosis not present

## 2020-12-13 DIAGNOSIS — R350 Frequency of micturition: Secondary | ICD-10-CM | POA: Diagnosis not present

## 2020-12-25 DIAGNOSIS — R7301 Impaired fasting glucose: Secondary | ICD-10-CM | POA: Diagnosis not present

## 2020-12-25 DIAGNOSIS — E559 Vitamin D deficiency, unspecified: Secondary | ICD-10-CM | POA: Diagnosis not present

## 2020-12-25 DIAGNOSIS — E782 Mixed hyperlipidemia: Secondary | ICD-10-CM | POA: Diagnosis not present

## 2020-12-28 NOTE — Unmapped (Signed)
Received call from pt's daughter reporting pt was recently treated for UTI but she is now having low grade fevers post ABX therapy. Advised her to take pt back to PCP. She reported pt got labs on 9/23 which have not been faxed to this office yet. Pt's daughter stated she would get lab to fax results.

## 2021-01-01 DIAGNOSIS — I129 Hypertensive chronic kidney disease with stage 1 through stage 4 chronic kidney disease, or unspecified chronic kidney disease: Secondary | ICD-10-CM | POA: Diagnosis not present

## 2021-01-01 DIAGNOSIS — F419 Anxiety disorder, unspecified: Secondary | ICD-10-CM | POA: Diagnosis not present

## 2021-01-04 NOTE — Unmapped (Signed)
[facility] Clinical Assessment & Refill Coordination Note    Tamara Morris, DOB: [birth year]  Phone: [phone] (home)     All above HIPAA information was verified with patient.     Was a Nurse, learning disability used for this call? No    Specialty Medication(s):   Transplant: Prograf 1mg      Current Outpatient Medications   Medication Sig Dispense Refill   ??? acetaminophen (TYLENOL) 325 MG tablet Take 500 mg by mouth every six (6) hours as needed for pain.      ??? aspirin (ECOTRIN) 81 MG tablet Take 81 mg by mouth daily.     ??? cholecalciferol, vitamin D3 25 mcg, 1,000 units,, (CHOLECALCIFEROL-25 MCG, 1,000 UNIT,) 1,000 unit (25 mcg) tablet Take 25 mcg by mouth daily.     ??? gabapentin (NEURONTIN) 100 MG capsule Take 100 mg by mouth nightly.     ??? hydrALAZINE (APRESOLINE) 25 MG tablet Take 25 mg by mouth Three (3) times a day.      ??? loperamide (IMODIUM A-D) 2 mg tablet Take 2 mg by mouth. Frequency:PRN   Dosage:2   MG  Instructions:  Note:Dose: 2MG      ??? LORazepam (ATIVAN) 0.5 MG tablet Take 0.5 mg by mouth. Frequency:PRN   Dosage:0.5   MG  Instructions:  Note:Dose: 0.5MG      ??? losartan (COZAAR) 100 MG tablet Take 100 mg by mouth daily.     ??? olmesartan (BENICAR) 20 MG tablet Take 20 mg by mouth daily.     ??? PROGRAF 1 mg capsule Take 1 capsule by mouth twice daily 60 capsule 11   ??? simvastatin (ZOCOR) 10 MG tablet Take 10 mg by mouth nightly.     ??? verapamil (CALAN-SR) 240 MG CR tablet        No current facility-administered medications for this visit.        Changes to medications: Tamara Morris reports no changes at this time.    Allergies   Allergen Reactions   ??? Codeine Other (See Comments)   ??? Hydromorphone Other (See Comments)   ??? Meperidine Other (See Comments)       Changes to allergies: No    SPECIALTY MEDICATION ADHERENCE     Prograf 1mg   : 8 days of medicine on hand       Medication Adherence    Patient reported X missed doses in the last month: 0  Specialty Medication: prograf 1mg   Adherence tools used: patient uses a pill box to manage medications  Support network for adherence: family member          Specialty medication(s) dose(s) confirmed: Regimen is correct and unchanged.     Are there any concerns with adherence? No    Adherence counseling provided? Not needed    CLINICAL MANAGEMENT AND INTERVENTION      Clinical Benefit Assessment:    Do you feel the medicine is effective or helping your condition? Yes    Clinical Benefit counseling provided? Not needed    Adverse Effects Assessment:    Are you experiencing any side effects? No    Are you experiencing difficulty administering your medicine? No    Quality of Life Assessment:         How many days over the past month did your transplant  keep you from your normal activities? For example, brushing your teeth or getting up in the morning. 0    Have you discussed this with your provider? Not needed  Acute Infection Status:    Acute infections noted within Epic:  No active infections  Patient reported infection: None    Therapy Appropriateness:    Is therapy appropriate and patient progressing towards therapeutic goals? Yes, therapy is appropriate and should be continued    DISEASE/MEDICATION-SPECIFIC INFORMATION      N/A    PATIENT SPECIFIC NEEDS     - Does the patient have any physical, cognitive, or cultural barriers? No    - Is the patient high risk? No    - Does the patient require a Care Management Plan? No     - Does the patient require physician intervention or other additional services (i.e. nutrition, smoking cessation, social work)? No      SHIPPING     Specialty Medication(s) to be Shipped:   Transplant: Prograf 1mg     Other medication(s) to be shipped: No additional medications requested for fill at this time     Changes to insurance: No    Delivery Scheduled: Yes, Expected medication delivery date: 01/08/2021.     Medication will be delivered via UPS to the confirmed prescription address in [facility].    The patient will receive a drug information handout for each medication shipped and additional FDA Medication Guides as required.  Verified that patient has previously received a Conservation officer, historic buildings and a Surveyor, mining.    The patient or caregiver noted above participated in the development of this care plan and knows that they can request review of or adjustments to the care plan at any time.      All of the patient's questions and concerns have been addressed.    Thad Ranger   [facility] Specialty Pharmacist

## 2021-01-04 NOTE — Unmapped (Addendum)
10/3 update: disregard below note, patient called in -ef      The [facility] has made a third and final attempt to reach this patient to refill the following medication:prograf.      We have left voicemails on the following phone numbers: [phone]  and have sent a MyChart message.    Dates contacted: 9/20, 9/23, 10/3  Last scheduled delivery: 9/1    The patient may be at risk of non-compliance with this medication. The patient should call the [facility] at [phone]  Option 4, then Option 2 (all other specialty patients) to refill medication.    Thad Ranger   [facility] Specialty Pharmacist

## 2021-01-06 DIAGNOSIS — K580 Irritable bowel syndrome with diarrhea: Secondary | ICD-10-CM | POA: Diagnosis not present

## 2021-01-06 DIAGNOSIS — Z23 Encounter for immunization: Secondary | ICD-10-CM | POA: Diagnosis not present

## 2021-01-06 DIAGNOSIS — E782 Mixed hyperlipidemia: Secondary | ICD-10-CM | POA: Diagnosis not present

## 2021-01-06 DIAGNOSIS — Z944 Liver transplant status: Secondary | ICD-10-CM | POA: Diagnosis not present

## 2021-01-06 DIAGNOSIS — M545 Low back pain, unspecified: Secondary | ICD-10-CM | POA: Diagnosis not present

## 2021-01-06 DIAGNOSIS — R7303 Prediabetes: Secondary | ICD-10-CM | POA: Diagnosis not present

## 2021-01-06 DIAGNOSIS — R053 Chronic cough: Secondary | ICD-10-CM | POA: Diagnosis not present

## 2021-01-06 DIAGNOSIS — Z0001 Encounter for general adult medical examination with abnormal findings: Secondary | ICD-10-CM | POA: Diagnosis not present

## 2021-01-06 DIAGNOSIS — G4700 Insomnia, unspecified: Secondary | ICD-10-CM | POA: Diagnosis not present

## 2021-01-06 DIAGNOSIS — I10 Essential (primary) hypertension: Secondary | ICD-10-CM | POA: Diagnosis not present

## 2021-01-06 DIAGNOSIS — N1831 Chronic kidney disease, stage 3a: Secondary | ICD-10-CM | POA: Diagnosis not present

## 2021-01-06 DIAGNOSIS — I6521 Occlusion and stenosis of right carotid artery: Secondary | ICD-10-CM | POA: Diagnosis not present

## 2021-01-07 MED FILL — PROGRAF 1 MG CAPSULE: ORAL | 30 days supply | Qty: 60 | Fill #7

## 2021-02-04 NOTE — Unmapped (Signed)
The [facility] has made a second and final attempt to reach this patient to refill the following medication: prograf.      We have left voicemails on the following phone numbers: [phone].    Dates contacted: 10/28   11/3  Last scheduled delivery: 01/07/21    The patient may be at risk of non-compliance with this medication. The patient should call the [facility] at [phone]  Option 4, then Option 2 (all other specialty patients) to refill medication.    [place] A Americo Vallery   [facility] Specialty Technician

## 2021-02-06 DIAGNOSIS — Z23 Encounter for immunization: Secondary | ICD-10-CM | POA: Diagnosis not present

## 2021-02-08 NOTE — Unmapped (Signed)
[facility] Refill Coordination Note    Specialty Medication(s) to be Shipped:   Transplant: Prograf 1mg     Other medication(s) to be shipped: No additional medications requested for fill at this time     Tamara Morris, DOB: 1940/10/25  Phone: [phone] (home)       All above HIPAA information was verified with patient.     Was a Nurse, learning disability used for this call? No    Completed refill call assessment today to schedule patient's medication shipment from the [facility] [phone]).  All relevant notes have been reviewed.     Specialty medication(s) and dose(s) confirmed: Regimen is correct and unchanged.   Changes to medications: [name] reports no changes at this time.  Changes to insurance: No  New side effects reported not previously addressed with a pharmacist or physician: None reported  Questions for the pharmacist: No    Confirmed patient received a Conservation officer, historic buildings and a Surveyor, mining with first shipment. The patient will receive a drug information handout for each medication shipped and additional FDA Medication Guides as required.       DISEASE/MEDICATION-SPECIFIC INFORMATION        N/A    SPECIALTY MEDICATION ADHERENCE     Medication Adherence    Patient reported X missed doses in the last month: 0  Specialty Medication: Prograf 1mg   Patient is on additional specialty medications: No  Informant: patient  Adherence tools used: patient uses a pill box to manage medications  Support network for adherence: family member              Were doses missed due to medication being on hold? No    Prograf 1 mg: 7 days of medicine on hand       REFERRAL TO PHARMACIST     Referral to the pharmacist: Not needed      [facility]     Shipping address confirmed in Epic.     Delivery Scheduled: Yes, Expected medication delivery date: 02/10/21.     Medication will be delivered via UPS to the prescription address in Epic WAM.    [name]   [facility] Specialty Technician

## 2021-02-09 MED FILL — PROGRAF 1 MG CAPSULE: ORAL | 30 days supply | Qty: 60 | Fill #8

## 2021-02-26 DIAGNOSIS — Z1152 Encounter for screening for COVID-19: Secondary | ICD-10-CM | POA: Diagnosis not present

## 2021-03-03 DIAGNOSIS — E782 Mixed hyperlipidemia: Secondary | ICD-10-CM | POA: Diagnosis not present

## 2021-03-03 DIAGNOSIS — I10 Essential (primary) hypertension: Secondary | ICD-10-CM | POA: Diagnosis not present

## 2021-03-05 ENCOUNTER — Ambulatory Visit: Admit: 2021-03-05 | Discharge: 2021-03-06 | Payer: MEDICARE

## 2021-03-05 DIAGNOSIS — Z944 Liver transplant status: Secondary | ICD-10-CM | POA: Diagnosis not present

## 2021-03-05 LAB — COMPREHENSIVE METABOLIC PANEL
ALBUMIN: 3.8 g/dL (ref 3.5–5.0)
ALKALINE PHOSPHATASE: 79 U/L (ref 46–116)
ALT (SGPT): 34 U/L (ref 12–78)
ANION GAP: 6 mmol/L (ref 3–11)
AST (SGOT): 22 U/L (ref 15–40)
BILIRUBIN TOTAL: 0.4 mg/dL (ref 0.3–1.2)
BLOOD UREA NITROGEN: 34 mg/dL — ABNORMAL HIGH (ref 8–20)
BUN / CREAT RATIO: 27
CALCIUM: 9.2 mg/dL (ref 8.5–10.1)
CHLORIDE: 107 mmol/L (ref 98–107)
CO2: 29.3 mmol/L (ref 21.0–32.0)
CREATININE: 1.27 mg/dL — ABNORMAL HIGH (ref 0.60–1.10)
EGFR CKD-EPI (2021) FEMALE: 43 mL/min/{1.73_m2} — ABNORMAL LOW (ref >=60)
GLUCOSE RANDOM: 111 mg/dL (ref 70–179)
POTASSIUM: 4.6 mmol/L (ref 3.5–5.0)
PROTEIN TOTAL: 6.9 g/dL (ref 6.0–8.0)
SODIUM: 142 mmol/L (ref 135–145)

## 2021-03-05 LAB — CBC W/ AUTO DIFF
BASOPHILS ABSOLUTE COUNT: 0.1 10*9/L (ref 0.0–0.2)
BASOPHILS RELATIVE PERCENT: 0.9 %
EOSINOPHILS ABSOLUTE COUNT: 0.3 10*9/L (ref 0.0–0.4)
EOSINOPHILS RELATIVE PERCENT: 4.2 %
HEMATOCRIT: 38.7 % (ref 34.0–44.0)
HEMOGLOBIN: 12.3 g/dL (ref 11.5–15.0)
LYMPHOCYTES ABSOLUTE COUNT: 2.7 10*9/L (ref 0.7–4.5)
LYMPHOCYTES RELATIVE PERCENT: 32.8 %
MEAN CORPUSCULAR HEMOGLOBIN CONC: 31.8 g/dL — ABNORMAL LOW (ref 32.0–36.0)
MEAN CORPUSCULAR HEMOGLOBIN: 30.8 pg (ref 27.0–34.0)
MEAN CORPUSCULAR VOLUME: 97 fL (ref 80.0–98.0)
MEAN PLATELET VOLUME: 9.9 fL (ref 7.4–10.4)
MONOCYTES ABSOLUTE COUNT: 0.8 10*9/L (ref 0.1–1.0)
MONOCYTES RELATIVE PERCENT: 10 %
NEUTROPHILS ABSOLUTE COUNT: 4.3 10*9/L (ref 1.8–7.8)
NEUTROPHILS RELATIVE PERCENT: 52 %
PLATELET COUNT: 239 10*9/L (ref 140–415)
RED BLOOD CELL COUNT: 3.99 10*12/L (ref 3.80–5.10)
RED CELL DISTRIBUTION WIDTH: 13.6 % (ref 11.5–14.5)
WBC ADJUSTED: 8.2 10*9/L (ref 4.0–10.5)

## 2021-03-05 LAB — BILIRUBIN, TOTAL AND DIRECT
BILIRUBIN DIRECT: 0.11 mg/dL (ref 0.10–0.30)
BILIRUBIN TOTAL: 0.4 mg/dL (ref 0.3–1.2)
BILIRUBIN, INDIRECT: 0.3 mg/dL (ref 0.1–0.8)

## 2021-03-05 LAB — PHOSPHORUS: PHOSPHORUS: 3.7 mg/dL (ref 2.4–5.1)

## 2021-03-05 LAB — MAGNESIUM: MAGNESIUM: 1.8 mg/dL (ref 1.6–2.6)

## 2021-03-06 LAB — TACROLIMUS LEVEL, TROUGH: TACROLIMUS, TROUGH: 3.7 ng/mL — ABNORMAL LOW (ref 5.0–15.0)

## 2021-03-08 NOTE — Unmapped (Signed)
[facility] Refill Coordination Note    Specialty Medication(s) to be Shipped:   Transplant: Prograf 1mg     Other medication(s) to be shipped: No additional medications requested for fill at this time     Tamara Morris, DOB: Apr 09, 1940  Phone: [phone] (home)       All above HIPAA information was verified with patient.     Was a Nurse, learning disability used for this call? No    Completed refill call assessment today to schedule patient's medication shipment from the [facility] [phone]).  All relevant notes have been reviewed.     Specialty medication(s) and dose(s) confirmed: Regimen is correct and unchanged.   Changes to medications: Tamara Morris reports no changes at this time.  Changes to insurance: No  New side effects reported not previously addressed with a pharmacist or physician: None reported  Questions for the pharmacist: No    Confirmed patient received a Conservation officer, historic buildings and a Surveyor, mining with first shipment. The patient will receive a drug information handout for each medication shipped and additional FDA Medication Guides as required.       DISEASE/MEDICATION-SPECIFIC INFORMATION        N/A    SPECIALTY MEDICATION ADHERENCE     Medication Adherence    Patient reported X missed doses in the last month: 0  Specialty Medication: Prograf 1mg   Patient is on additional specialty medications: No  Any gaps in refill history greater than 2 weeks in the last 3 months: no  Demonstrates understanding of importance of adherence: yes  Informant: patient  Reliability of informant: reliable  Adherence tools used: patient uses a pill box to manage medications  Support network for adherence: family member  Confirmed plan for next specialty medication refill: delivery by pharmacy  Refills needed for supportive medications: not needed              Were doses missed due to medication being on hold? No    Prograf 1 mg: 7 days of medicine on hand       REFERRAL TO PHARMACIST Referral to the pharmacist: Not needed      [facility]     Shipping address confirmed in Epic.     Delivery Scheduled: Yes, Expected medication delivery date: 03/10/2021.     Medication will be delivered via UPS to the prescription address in Epic WAM.    Tamara Morris D Cindia Hustead   [facility] Specialty Technician

## 2021-03-09 MED FILL — PROGRAF 1 MG CAPSULE: ORAL | 30 days supply | Qty: 60 | Fill #9

## 2021-03-31 DIAGNOSIS — R059 Cough, unspecified: Secondary | ICD-10-CM | POA: Diagnosis not present

## 2021-03-31 DIAGNOSIS — R509 Fever, unspecified: Secondary | ICD-10-CM | POA: Diagnosis not present

## 2021-03-31 DIAGNOSIS — J069 Acute upper respiratory infection, unspecified: Secondary | ICD-10-CM | POA: Diagnosis not present

## 2021-03-31 DIAGNOSIS — R0981 Nasal congestion: Secondary | ICD-10-CM | POA: Diagnosis not present

## 2021-03-31 NOTE — Unmapped (Signed)
[facility] Refill Coordination Note    Specialty Medication(s) to be Shipped:   Transplant: Prograf 1mg     Other medication(s) to be shipped: No additional medications requested for fill at this time     Tamara Morris, DOB: 1940/07/28  Phone: [phone] (home)       All above HIPAA information was verified with patient.     Was a Nurse, learning disability used for this call? No    Completed refill call assessment today to schedule patient's medication shipment from the [facility] [phone]).  All relevant notes have been reviewed.     Specialty medication(s) and dose(s) confirmed: Regimen is correct and unchanged.   Changes to medications: Dasja reports no changes at this time.  Changes to insurance: No  New side effects reported not previously addressed with a pharmacist or physician: None reported  Questions for the pharmacist: No    Confirmed patient received a Conservation officer, historic buildings and a Surveyor, mining with first shipment. The patient will receive a drug information handout for each medication shipped and additional FDA Medication Guides as required.       DISEASE/MEDICATION-SPECIFIC INFORMATION        N/A    SPECIALTY MEDICATION ADHERENCE     Medication Adherence    Patient reported X missed doses in the last month: 0  Specialty Medication: Prograf 1mg   Patient is on additional specialty medications: No  Adherence tools used: patient uses a pill box to manage medications  Support network for adherence: family member       Were doses missed due to medication being on hold? No    Prograf 1 mg: 10 days of medicine on hand     REFERRAL TO PHARMACIST     Referral to the pharmacist: Not needed      [facility]     Shipping address confirmed in Epic.     Delivery Scheduled: Yes, Expected medication delivery date: 04/07/2020.     Medication will be delivered via UPS to the prescription address in Epic WAM.    Lorelei Pont [facility] Specialty Technician

## 2021-04-02 DIAGNOSIS — I10 Essential (primary) hypertension: Secondary | ICD-10-CM | POA: Diagnosis not present

## 2021-04-02 DIAGNOSIS — E782 Mixed hyperlipidemia: Secondary | ICD-10-CM | POA: Diagnosis not present

## 2021-04-05 DIAGNOSIS — Z944 Liver transplant status: Principal | ICD-10-CM

## 2021-04-06 DIAGNOSIS — Z944 Liver transplant status: Principal | ICD-10-CM

## 2021-04-06 MED ORDER — TACROLIMUS 1 MG CAPSULE, IMMEDIATE-RELEASE
ORAL_CAPSULE | Freq: Two times a day (BID) | ORAL | 11 refills | 30 days | Status: CP
Start: 2021-04-06 — End: ?
  Filled 2021-04-07: qty 60, 30d supply, fill #0

## 2021-04-06 NOTE — Unmapped (Signed)
Tamara Morris 's Tacrolimus shipment will be delayed as a result of a high copay.     I have reached out to the patient  at [phone] and left a voicemail message.  We will wait for a call back from the patient to reschedule the delivery.  We have not confirmed the new delivery date.

## 2021-04-07 NOTE — Unmapped (Signed)
Tamara Morris 's Prograf shipment will be sent out  as a result of copay is now approved by patient/caregiver.      I have reached out to the patient  at [phone] and communicated the delivery change. We will reschedule the medication for the delivery date that the patient agreed upon.  We have confirmed the delivery date as 04/08/21, via ups.

## 2021-04-09 DIAGNOSIS — E559 Vitamin D deficiency, unspecified: Secondary | ICD-10-CM | POA: Diagnosis not present

## 2021-04-09 DIAGNOSIS — R7303 Prediabetes: Secondary | ICD-10-CM | POA: Diagnosis not present

## 2021-04-09 DIAGNOSIS — I10 Essential (primary) hypertension: Secondary | ICD-10-CM | POA: Diagnosis not present

## 2021-04-17 DIAGNOSIS — I6521 Occlusion and stenosis of right carotid artery: Secondary | ICD-10-CM | POA: Diagnosis not present

## 2021-04-17 DIAGNOSIS — M545 Low back pain, unspecified: Secondary | ICD-10-CM | POA: Diagnosis not present

## 2021-04-17 DIAGNOSIS — R7303 Prediabetes: Secondary | ICD-10-CM | POA: Diagnosis not present

## 2021-04-17 DIAGNOSIS — R053 Chronic cough: Secondary | ICD-10-CM | POA: Diagnosis not present

## 2021-04-17 DIAGNOSIS — F411 Generalized anxiety disorder: Secondary | ICD-10-CM | POA: Diagnosis not present

## 2021-04-17 DIAGNOSIS — K580 Irritable bowel syndrome with diarrhea: Secondary | ICD-10-CM | POA: Diagnosis not present

## 2021-04-17 DIAGNOSIS — N1831 Chronic kidney disease, stage 3a: Secondary | ICD-10-CM | POA: Diagnosis not present

## 2021-04-17 DIAGNOSIS — I10 Essential (primary) hypertension: Secondary | ICD-10-CM | POA: Diagnosis not present

## 2021-04-17 DIAGNOSIS — Z944 Liver transplant status: Secondary | ICD-10-CM | POA: Diagnosis not present

## 2021-04-17 DIAGNOSIS — G4700 Insomnia, unspecified: Secondary | ICD-10-CM | POA: Diagnosis not present

## 2021-04-17 DIAGNOSIS — E782 Mixed hyperlipidemia: Secondary | ICD-10-CM | POA: Diagnosis not present

## 2021-04-17 DIAGNOSIS — E875 Hyperkalemia: Secondary | ICD-10-CM | POA: Diagnosis not present

## 2021-05-11 ENCOUNTER — Other Ambulatory Visit (HOSPITAL_COMMUNITY): Payer: Self-pay | Admitting: Internal Medicine

## 2021-05-11 DIAGNOSIS — Z1231 Encounter for screening mammogram for malignant neoplasm of breast: Secondary | ICD-10-CM

## 2021-05-11 MED FILL — PROGRAF 1 MG CAPSULE: ORAL | 30 days supply | Qty: 60 | Fill #1

## 2021-05-11 NOTE — Unmapped (Signed)
The [facility] has made a third and final attempt to reach this patient to refill the following medication:prograf.      We have left voicemails on the following phone numbers: [phone]  and have sent a MyChart message.    Dates contacted: 1/25, 2/1, 2/7  Last scheduled delivery: 1/4    The patient may be at risk of non-compliance with this medication. The patient should call the [facility] at [phone]  Option 4, then Option 2 (all other specialty patients) to refill medication.    Thad Ranger   [facility] Specialty Pharmacist

## 2021-05-11 NOTE — Unmapped (Signed)
[facility] Refill Coordination Note    Specialty Medication(s) to be Shipped:   Transplant: Prograf 1mg     Other medication(s) to be shipped: No additional medications requested for fill at this time     Tamara Morris, DOB: [birth year]  Phone: [phone] (home)       All above HIPAA information was verified with patient.     Was a Nurse, learning disability used for this call? No    Completed refill call assessment today to schedule patient's medication shipment from the [facility] [phone]).  All relevant notes have been reviewed.     Specialty medication(s) and dose(s) confirmed: Regimen is correct and unchanged.   Changes to medications: Tamara Morris reports no changes at this time.  Changes to insurance: No  New side effects reported not previously addressed with a pharmacist or physician: None reported  Questions for the pharmacist: No    Confirmed patient received a Conservation officer, historic buildings and a Surveyor, mining with first shipment. The patient will receive a drug information handout for each medication shipped and additional FDA Medication Guides as required.       DISEASE/MEDICATION-SPECIFIC INFORMATION        N/A    SPECIALTY MEDICATION ADHERENCE     Medication Adherence    Patient reported X missed doses in the last month: 0  Specialty Medication: Prograf 1mg   Patient is on additional specialty medications: No  Informant: patient  Adherence tools used: patient uses a pill box to manage medications  Support network for adherence: family member        Were doses missed due to medication being on hold? No    Prograf 1 mg: 4 days of medicine on hand     REFERRAL TO PHARMACIST     Referral to the pharmacist: Not needed      [facility]     Shipping address confirmed in Epic.     Delivery Scheduled: Yes, Expected medication delivery date: 05/12/2021.     Medication will be delivered via UPS to the prescription address in Epic WAM.    Lorelei Pont [facility] Specialty Technician

## 2021-05-21 ENCOUNTER — Ambulatory Visit (HOSPITAL_COMMUNITY)
Admission: RE | Admit: 2021-05-21 | Discharge: 2021-05-21 | Disposition: A | Discharge status: Home / Self Care | Payer: Medicare Other | Source: Ambulatory Visit | Attending: Internal Medicine | Admitting: Internal Medicine

## 2021-05-21 ENCOUNTER — Other Ambulatory Visit: Payer: Self-pay

## 2021-05-21 DIAGNOSIS — Z1231 Encounter for screening mammogram for malignant neoplasm of breast: Secondary | ICD-10-CM | POA: Insufficient documentation

## 2021-05-31 NOTE — Unmapped (Signed)
[facility] Clinical Assessment & Refill Coordination Note    Tamara Morris, DOB: 1940-10-12  Phone: [phone] (home)     All above HIPAA information was verified with patient.     Was a Nurse, learning disability used for this call? No    Specialty Medication(s):   Transplant: Prograf 1mg      Current Outpatient Medications   Medication Sig Dispense Refill   ??? acetaminophen (TYLENOL) 325 MG tablet Take 500 mg by mouth every six (6) hours as needed for pain.      ??? aspirin (ECOTRIN) 81 MG tablet Take 81 mg by mouth daily.     ??? cholecalciferol, vitamin D3 25 mcg, 1,000 units,, (CHOLECALCIFEROL-25 MCG, 1,000 UNIT,) 1,000 unit (25 mcg) tablet Take 25 mcg by mouth daily.     ??? gabapentin (NEURONTIN) 100 MG capsule Take 100 mg by mouth nightly.     ??? hydrALAZINE (APRESOLINE) 25 MG tablet Take 25 mg by mouth Three (3) times a day.      ??? loperamide (IMODIUM A-D) 2 mg tablet Take 2 mg by mouth. Frequency:PRN   Dosage:2   MG  Instructions:  Note:Dose: 2MG      ??? LORazepam (ATIVAN) 0.5 MG tablet Take 0.5 mg by mouth. Frequency:PRN   Dosage:0.5   MG  Instructions:  Note:Dose: 0.5MG      ??? losartan (COZAAR) 100 MG tablet Take 100 mg by mouth daily.     ??? olmesartan (BENICAR) 20 MG tablet Take 20 mg by mouth daily.     ??? simvastatin (ZOCOR) 10 MG tablet Take 10 mg by mouth nightly.     ??? tacrolimus (PROGRAF) 1 MG capsule Take 1 capsule (1 mg total) by mouth two (2) times a day. 60 capsule 11   ??? verapamil (CALAN-SR) 240 MG CR tablet        No current facility-administered medications for this visit.        Changes to medications: Vasti reports no changes at this time.    Allergies   Allergen Reactions   ??? Codeine Other (See Comments)   ??? Hydromorphone Other (See Comments)   ??? Meperidine Other (See Comments)       Changes to allergies: No    SPECIALTY MEDICATION ADHERENCE     Prograf 1 mg: 8 days of medicine on hand       Medication Adherence    Patient reported X missed doses in the last month: 0  Specialty Medication: Prograf 1mg   Patient is on additional specialty medications: No  Adherence tools used: patient uses a pill box to manage medications  Support network for adherence: family member          Specialty medication(s) dose(s) confirmed: Regimen is correct and unchanged.     Are there any concerns with adherence? No    Adherence counseling provided? Not needed    CLINICAL MANAGEMENT AND INTERVENTION      Clinical Benefit Assessment:    Do you feel the medicine is effective or helping your condition? Yes    Clinical Benefit counseling provided? Not needed    Adverse Effects Assessment:    Are you experiencing any side effects? No    Are you experiencing difficulty administering your medicine? No    Quality of Life Assessment:    Quality of Life    Rheumatology  Oncology  Dermatology  Cystic Fibrosis          How many days over the past month did your liver transplant  keep you from your normal activities? For example, brushing your teeth or getting up in the morning. 0    Have you discussed this with your provider? Not needed    Acute Infection Status:    Acute infections noted within Epic:  No active infections  Patient reported infection: None    Therapy Appropriateness:    Is therapy appropriate and patient progressing towards therapeutic goals? Yes, therapy is appropriate and should be continued    DISEASE/MEDICATION-SPECIFIC INFORMATION      N/A    PATIENT SPECIFIC NEEDS     - Does the patient have any physical, cognitive, or cultural barriers? No    - Is the patient high risk? No    - Does the patient require a Care Management Plan? No     SOCIAL DETERMINANTS OF HEALTH     At the [facility], we have learned that life circumstances - like trouble affording food, housing, utilities, or transportation can affect the health of many of our patients.   That is why we wanted to ask: are you currently experiencing any life circumstances that are negatively impacting your health and/or quality of life? No    Social Determinants of Health     Food Insecurity: Not on file   Tobacco Use: Medium Risk   ??? Smoking Tobacco Use: Former   ??? Smokeless Tobacco Use: Never   ??? Passive Exposure: Not on file   Transportation Needs: Not on file   Alcohol Use: Not on file   Housing/Utilities: Not on file   Substance Use: Not on file   Financial Resource Strain: Not on file   Physical Activity: Not on file   Health Literacy: Not on file   Stress: Not on file   Intimate Partner Violence: Not on file   Depression: Not on file   Social Connections: Not on file       Would you be willing to receive help with any of the needs that you have identified today? Not applicable       SHIPPING     Specialty Medication(s) to be Shipped:   Transplant: Prograf 1mg     Other medication(s) to be shipped: No additional medications requested for fill at this time     Changes to insurance: No    Delivery Scheduled: Yes, Expected medication delivery date: 06/07/21.     Medication will be delivered via UPS to the confirmed prescription address in [facility].    The patient will receive a drug information handout for each medication shipped and additional FDA Medication Guides as required.  Verified that patient has previously received a Conservation officer, historic buildings and a Surveyor, mining.    The patient or caregiver noted above participated in the development of this care plan and knows that they can request review of or adjustments to the care plan at any time.      All of the patient's questions and concerns have been addressed.    Tera Helper   [facility] Specialty Pharmacist

## 2021-06-04 MED FILL — PROGRAF 1 MG CAPSULE: ORAL | 30 days supply | Qty: 60 | Fill #2

## 2021-06-18 ENCOUNTER — Ambulatory Visit: Admit: 2021-06-18 | Discharge: 2021-06-19 | Payer: MEDICARE

## 2021-06-18 DIAGNOSIS — Z944 Liver transplant status: Secondary | ICD-10-CM | POA: Diagnosis not present

## 2021-06-18 DIAGNOSIS — Z79899 Other long term (current) drug therapy: Secondary | ICD-10-CM | POA: Diagnosis not present

## 2021-06-18 LAB — CBC W/ AUTO DIFF
BASOPHILS ABSOLUTE COUNT: 0.1 10*9/L (ref 0.0–0.2)
BASOPHILS RELATIVE PERCENT: 0.6 %
EOSINOPHILS ABSOLUTE COUNT: 0.3 10*9/L (ref 0.0–0.4)
EOSINOPHILS RELATIVE PERCENT: 4.1 %
HEMATOCRIT: 39.1 % (ref 34.0–44.0)
HEMOGLOBIN: 12.5 g/dL (ref 11.5–15.0)
LYMPHOCYTES ABSOLUTE COUNT: 2.5 10*9/L (ref 0.7–4.5)
LYMPHOCYTES RELATIVE PERCENT: 30.7 %
MEAN CORPUSCULAR HEMOGLOBIN CONC: 32 g/dL (ref 32.0–36.0)
MEAN CORPUSCULAR HEMOGLOBIN: 30.7 pg (ref 27.0–34.0)
MEAN CORPUSCULAR VOLUME: 96.1 fL (ref 80.0–98.0)
MEAN PLATELET VOLUME: 9.6 fL (ref 7.4–10.4)
MONOCYTES ABSOLUTE COUNT: 0.8 10*9/L (ref 0.1–1.0)
MONOCYTES RELATIVE PERCENT: 10.3 %
NEUTROPHILS ABSOLUTE COUNT: 4.4 10*9/L (ref 1.8–7.8)
NEUTROPHILS RELATIVE PERCENT: 54.2 %
PLATELET COUNT: 222 10*9/L (ref 140–415)
RED BLOOD CELL COUNT: 4.07 10*12/L (ref 3.80–5.10)
RED CELL DISTRIBUTION WIDTH: 13.6 % (ref 11.5–14.5)
WBC ADJUSTED: 8.1 10*9/L (ref 4.0–10.5)

## 2021-06-18 LAB — COMPREHENSIVE METABOLIC PANEL
ALBUMIN: 4 g/dL (ref 3.5–5.0)
ALKALINE PHOSPHATASE: 82 U/L (ref 46–116)
ALT (SGPT): 41 U/L (ref 12–78)
ANION GAP: 6 mmol/L (ref 3–11)
AST (SGOT): 23 U/L (ref 15–40)
BILIRUBIN TOTAL: 0.6 mg/dL (ref 0.3–1.2)
BLOOD UREA NITROGEN: 36 mg/dL — ABNORMAL HIGH (ref 8–20)
BUN / CREAT RATIO: 29
CALCIUM: 9.5 mg/dL (ref 8.5–10.1)
CHLORIDE: 105 mmol/L (ref 98–107)
CO2: 28.8 mmol/L (ref 21.0–32.0)
CREATININE: 1.25 mg/dL — ABNORMAL HIGH (ref 0.60–1.10)
EGFR CKD-EPI (2021) FEMALE: 44 mL/min/{1.73_m2} — ABNORMAL LOW (ref >=60)
GLUCOSE RANDOM: 102 mg/dL (ref 70–179)
POTASSIUM: 4.3 mmol/L (ref 3.5–5.0)
PROTEIN TOTAL: 6.8 g/dL (ref 6.0–8.0)
SODIUM: 140 mmol/L (ref 135–145)

## 2021-06-18 LAB — MAGNESIUM: MAGNESIUM: 1.9 mg/dL (ref 1.6–2.6)

## 2021-06-18 LAB — PHOSPHORUS: PHOSPHORUS: 4 mg/dL (ref 2.4–5.1)

## 2021-06-18 LAB — GAMMA GT: GAMMA GLUTAMYL TRANSFERASE: 66 U/L — ABNORMAL HIGH (ref 0–38)

## 2021-06-18 LAB — BILIRUBIN, DIRECT: BILIRUBIN DIRECT: 0.14 mg/dL (ref 0.10–0.30)

## 2021-06-19 LAB — TACROLIMUS LEVEL: TACROLIMUS BLOOD: 2.8 ng/mL

## 2021-06-28 NOTE — Unmapped (Signed)
[facility] Refill Coordination Note    Specialty Medication(s) to be Shipped:   Transplant: Prograf 1mg     Other medication(s) to be shipped: No additional medications requested for fill at this time     Tamara Morris, DOB: 1940-06-06  Phone: [phone] (home)       All above HIPAA information was verified with patient.     Was a Nurse, learning disability used for this call? No    Completed refill call assessment today to schedule patient's medication shipment from the [facility] [phone]).  All relevant notes have been reviewed.     Specialty medication(s) and dose(s) confirmed: Regimen is correct and unchanged.   Changes to medications: [name] reports no changes at this time.  Changes to insurance: No  New side effects reported not previously addressed with a pharmacist or physician: None reported  Questions for the pharmacist: No    Confirmed patient received a Conservation officer, historic buildings and a Surveyor, mining with first shipment. The patient will receive a drug information handout for each medication shipped and additional FDA Medication Guides as required.       DISEASE/MEDICATION-SPECIFIC INFORMATION        N/A    SPECIALTY MEDICATION ADHERENCE     Medication Adherence    Patient reported X missed doses in the last month: 0  Specialty Medication: PROGRAF 1 MG capsule (tacrolimus)  Patient is on additional specialty medications: No  Patient is on more than two specialty medications: No  Any gaps in refill history greater than 2 weeks in the last 3 months: no  Demonstrates understanding of importance of adherence: yes  Informant: patient  Reliability of informant: reliable  Patient is at risk for Non-Adherence: No  Adherence tools used: patient uses a pill box to manage medications  Support network for adherence: family member  Confirmed plan for next specialty medication refill: delivery by pharmacy  Refills needed for supportive medications: not needed          Refill Coordination    Has the Patients' Contact Information Changed: No  Is the Shipping Address Different: No         Were doses missed due to medication being on hold? No    PROGRAF 1 mg: 10 days of medicine on hand     REFERRAL TO PHARMACIST     Referral to the pharmacist: Not needed      [facility]     Shipping address confirmed in Epic.     Delivery Scheduled: Yes, Expected medication delivery date: 07/05/21.     Medication will be delivered via UPS to the prescription address in Epic WAM.    [name]   [facility] Specialty Technician

## 2021-07-02 MED FILL — PROGRAF 1 MG CAPSULE: ORAL | 30 days supply | Qty: 60 | Fill #3

## 2021-07-10 ENCOUNTER — Encounter (HOSPITAL_COMMUNITY): Payer: Self-pay

## 2021-07-10 ENCOUNTER — Other Ambulatory Visit: Payer: Self-pay

## 2021-07-10 ENCOUNTER — Emergency Department (HOSPITAL_COMMUNITY)
Admission: EM | Admit: 2021-07-10 | Discharge: 2021-07-10 | Disposition: A | Discharge status: Home / Self Care | Payer: Medicare Other | Attending: Emergency Medicine | Admitting: Emergency Medicine

## 2021-07-10 DIAGNOSIS — W4904XA Ring or other jewelry causing external constriction, initial encounter: Secondary | ICD-10-CM | POA: Diagnosis not present

## 2021-07-10 DIAGNOSIS — Z7982 Long term (current) use of aspirin: Secondary | ICD-10-CM | POA: Diagnosis not present

## 2021-07-10 DIAGNOSIS — S60444A External constriction of right ring finger, initial encounter: Secondary | ICD-10-CM | POA: Diagnosis not present

## 2021-07-10 DIAGNOSIS — S60445A External constriction of left ring finger, initial encounter: Secondary | ICD-10-CM | POA: Diagnosis not present

## 2021-07-10 DIAGNOSIS — S60449A External constriction of unspecified finger, initial encounter: Secondary | ICD-10-CM

## 2021-07-10 MED ORDER — LIDOCAINE HCL (PF) 1 % IJ SOLN: 5.0000 mL | Freq: Once | INTRAMUSCULAR | Status: AC

## 2021-07-10 MED ORDER — LIDOCAINE HCL (PF) 1 % IJ SOLN
INTRAMUSCULAR | Status: AC
Start: 2021-07-10 — End: 2021-07-10
  Administered 2021-07-10: 5 mL
  Filled 2021-07-10: qty 5

## 2021-07-10 NOTE — ED Notes (Signed)
PA at bedside. Attempting to remove ring at this time  ?

## 2021-07-10 NOTE — ED Triage Notes (Signed)
Patient c/o left ring finger pain. States she was trying to get ring off when the ring broke in half, still has piece on it and c/o pain ?

## 2021-07-10 NOTE — ED Provider Notes (Signed)
?[facility] ?Provider Note ? ? ?CSN: [id number] ?Arrival date & time: 07/10/21  3646 ? ?  ? ?History ? ?Chief Complaint  ?Patient presents with  ? Hand Pain  ? ? ?Linda Barr is a 81 y.o. female. ? ? ?Hand Pain ? ? ?  ? ?Linda Barr is a 81 y.o. female who presents to the Emergency Department complaining of having a ring stuck on her left ring finger.  She states that she was washing her hands last night and did not notice that a piece of her ring had broken off.  She states that 1 end of the ring pierced the skin of her finger.  She is here requesting removal of the ring.  She complains of swelling to her finger and pain when you attempt to move the ring.  Denies any numbness, she does not take any anticoagulants.  Last tetanus is up-to-date. ? ?Home Medications ?Prior to Admission medications   ?Medication Sig Start Date End Date Taking? Authorizing Provider  ?acetaminophen (TYLENOL) 500 MG tablet Take 500 mg by mouth every 6 (six) hours as needed for mild pain or moderate pain.    [provider]  ?aspirin 81 MG tablet Take 81 mg by mouth daily as needed (for palpitations).     [provider]  ?hydrALAZINE (APRESOLINE) 25 MG tablet Take 25 mg by mouth 2 (two) times daily. 08/07/17   [provider]  ?lisinopril (PRINIVIL,ZESTRIL) 20 MG tablet Take 20 mg by mouth daily.    [provider]  ?LORazepam (ATIVAN) 0.5 MG tablet Take 0.5 mg by mouth daily as needed. 06/28/19   [provider]  ?losartan (COZAAR) 100 MG tablet Take 100 mg by mouth daily. 06/26/19   [provider]  ?olmesartan (BENICAR) 20 MG tablet Take 20 mg by mouth daily. 08/22/17   [provider]  ?simvastatin (ZOCOR) 10 MG tablet TAKE 1 TABLET BY MOUTH EVERY DAY AT NIGHT 06/13/17   [provider]  ?sodium chloride (OCEAN) 0.65 % SOLN nasal spray Place 1 spray into both nostrils every 4 (four) hours as needed for congestion.    [provider]   ?tacrolimus (PROGRAF) 1 MG capsule Take 1 mg by mouth 2 (two) times daily.    [provider]  ?verapamil (CALAN-SR) 240 MG CR tablet Take 240 mg by mouth daily.    [provider]  ?   ? ?Allergies    ?Codeine, Demerol [meperidine], Dilaudid [hydromorphone hcl], Hydromorphone, and Morphine and related   ? ?Review of Systems   ?Review of Systems  ?Musculoskeletal:  Negative for arthralgias.  ?Skin:  Positive for wound. Negative for color change.  ?Neurological:  Negative for numbness.  ?All other systems reviewed and are negative. ? ?Physical Exam ?Updated Vital Signs ?BP (!) 179/84   Pulse (!) 104   Temp 98.1 ?F (36.7 ?C)   Resp 16   Ht '5\' 4"'$  (1.626 m)   Wt 81.6 kg   SpO2 96%   BMI 30.90 kg/m?  ?Physical Exam ?Vitals and nursing note reviewed.  ?Constitutional:   ?   Appearance: Normal appearance. She is not ill-appearing.  ?Cardiovascular:  ?   Pulses: Normal pulses.  ?Pulmonary:  ?   Effort: Pulmonary effort is normal.  ?Musculoskeletal:     ?   General: Swelling and tenderness (Metal ring on left ring finger, portion of the ring is broken and piercing the medial aspect of the finger.  No active bleeding.  Mild to moderate edema distal to the ring.  No discoloration to the distal finger.  Sensation intact.  Good cap refill.) present.  ?Skin: ?   General: Skin is warm.  ?   Capillary Refill: Capillary refill takes less than 2 seconds.  ?Neurological:  ?   General: No focal deficit present.  ?   Mental Status: She is alert.  ?   Sensory: No sensory deficit.  ?   Motor: No weakness.  ? ? ?ED Results / Procedures / Treatments   ?Labs ?(all labs ordered are listed, but only abnormal results are displayed) ?Labs Reviewed - No data to display ? ?EKG ?None ? ?Radiology ?No results found. ? ?Procedures ?Marland KitchenForeign Body Removal ? ?Date/Time: 07/10/2021 9:15 AM ?Performed by: Kem Parkinson, PA-C ?Authorized by: Kem Parkinson, PA-C  ?Consent: Verbal consent obtained. ?Consent given by:  patient ?Patient understanding: patient states understanding of the procedure being performed ?Patient consent: the patient's understanding of the procedure matches consent given ?Patient identity confirmed: verbally with patient and arm band ?Body area: skin ?General location: upper extremity ?Location details: left ring finger ?Anesthesia: local infiltration ? ?Anesthesia: ?Local Anesthetic: lidocaine 1% without epinephrine ?Anesthetic total: 1 mL ? ?Sedation: ?Patient sedated: no ? ?Patient restrained: no ?Patient cooperative: yes ?Localization method: visualized ?Removal mechanism: ring was removed using a ring cutter. ?Dressing: dressing applied ?Tendon involvement: none ?Depth: subcutaneous ?Complexity: simple ?1 objects recovered. ?Objects recovered: metal ring ?Post-procedure assessment: foreign body removed ?Patient tolerance: patient tolerated the procedure well with no immediate complications ?  ? ? ?Medications Ordered in ED ?Medications  ?lidocaine (PF) (XYLOCAINE) 1 % injection 5 mL (5 mLs Other Given 07/10/21 0908)  ? ? ?ED Course/ Medical Decision Making/ A&P ?  ?                        ?Medical Decision Making ?Risk ?Prescription drug management. ? ? ?Patient here requesting removal of a metal ring to her left ring finger.  Ring broke last evening while washing her hands, portion of the broken ring pierced the skin of the finger.  There was edema of the finger distally.  No active bleeding. ?Ring was successfully removed by me using a ring cutter.  Her Td is up-to-date.  No retained foreign bodies visualized.  Patient agreeable to wound care instructions. ? ? ? ? ? ? ? ? ? ?Final Clinical Impression(s) / ED Diagnoses ?Final diagnoses:  ?Tight ring on finger  ? ? ?Rx / DC Orders ?ED Discharge Orders   ? ? None  ? ?  ? ? ?  ?Kem Parkinson, PA-C ?07/10/21 3005 ? ?  ?Daleen Bo, MD ?07/11/21 2041 ? ?

## 2021-07-10 NOTE — Discharge Instructions (Signed)
Keep the finger clean with mild soap and water.  You may keep it bandaged as needed.  Elevating your hand will help with swelling.  Return to the ER if needed. ?

## 2021-07-27 NOTE — Unmapped (Signed)
Called pt to schedule annual appt in July. Pt needed after second half of month and is scheduled for 7/19 at 10 am. She will get labs locally and said that she needs a new order by June. Told her that info would be shared with coord. She said she would like appt letter in mail and verbalized understanding of all discussed.    Pt mentioned not hearing from primary coord for a while and she said she's completely comfortable with her and this tpa. She also was thrilled that she was seeing same provider this year because she 'loved her'.Thanked her for her kindness and reminded her that no news is good news.    Pt also mentioned she misses being an inspiration to more recent liver tx recipients which she could be at liver tx reunions, which haven't occurred since covid lockdown. Mentioned [place] tx calendar in which she could be featured, and she said she was very interested in it. Asked her if this tpa could share her name with person in charge of calendar and she said yes. Reminded her she would probably get a call from that person re the calendar and photo would be taken at her annual appt. She verbalized understanding.    Emailed person in charge of calendar to express her interest and copied primary coord.    Appt letter sent thru epic mail.

## 2021-07-27 NOTE — Unmapped (Signed)
[facility] Refill Coordination Note    Specialty Medication(s) to be Shipped:   Transplant: Prograf 1mg     Other medication(s) to be shipped: No additional medications requested for fill at this time     Tamara Morris, DOB: [birth year]  Phone: [phone] (home)       All above HIPAA information was verified with patient.     Was a Nurse, learning disability used for this call? No    Completed refill call assessment today to schedule patient's medication shipment from the [facility] [phone]).  All relevant notes have been reviewed.     Specialty medication(s) and dose(s) confirmed: Regimen is correct and unchanged.   Changes to medications: Nakeyia reports no changes at this time.  Changes to insurance: No  New side effects reported not previously addressed with a pharmacist or physician: None reported  Questions for the pharmacist: No    Confirmed patient received a Conservation officer, historic buildings and a Surveyor, mining with first shipment. The patient will receive a drug information handout for each medication shipped and additional FDA Medication Guides as required.       DISEASE/MEDICATION-SPECIFIC INFORMATION        N/A    SPECIALTY MEDICATION ADHERENCE     Medication Adherence    Patient reported X missed doses in the last month: 0  Specialty Medication: tacrolimus (PROGRAF) 1 MG capsule  Patient is on additional specialty medications: No  Adherence tools used: patient uses a pill box to manage medications  Support network for adherence: family member              Were doses missed due to medication being on hold? No  Prograf 1mg : 8 days worth of medication on hand.      REFERRAL TO PHARMACIST     Referral to the pharmacist: Not needed      [facility]     Shipping address confirmed in Epic.     Delivery Scheduled: Yes, Expected medication delivery date: 08/03/21.     Medication will be delivered via UPS to the prescription address in Epic WAM.    [place] A Devyn Sheerin   [facility] Shared [facility] Specialty Technician

## 2021-08-01 DIAGNOSIS — I129 Hypertensive chronic kidney disease with stage 1 through stage 4 chronic kidney disease, or unspecified chronic kidney disease: Secondary | ICD-10-CM | POA: Diagnosis not present

## 2021-08-01 DIAGNOSIS — E782 Mixed hyperlipidemia: Secondary | ICD-10-CM | POA: Diagnosis not present

## 2021-08-01 DIAGNOSIS — N1831 Chronic kidney disease, stage 3a: Secondary | ICD-10-CM | POA: Diagnosis not present

## 2021-08-02 MED FILL — PROGRAF 1 MG CAPSULE: ORAL | 30 days supply | Qty: 60 | Fill #4

## 2021-08-06 DIAGNOSIS — R7303 Prediabetes: Secondary | ICD-10-CM | POA: Diagnosis not present

## 2021-08-06 DIAGNOSIS — E782 Mixed hyperlipidemia: Secondary | ICD-10-CM | POA: Diagnosis not present

## 2021-08-06 DIAGNOSIS — E559 Vitamin D deficiency, unspecified: Secondary | ICD-10-CM | POA: Diagnosis not present

## 2021-08-13 DIAGNOSIS — I10 Essential (primary) hypertension: Secondary | ICD-10-CM | POA: Diagnosis not present

## 2021-08-13 DIAGNOSIS — Z944 Liver transplant status: Secondary | ICD-10-CM | POA: Diagnosis not present

## 2021-08-13 DIAGNOSIS — M545 Low back pain, unspecified: Secondary | ICD-10-CM | POA: Diagnosis not present

## 2021-08-13 DIAGNOSIS — F411 Generalized anxiety disorder: Secondary | ICD-10-CM | POA: Diagnosis not present

## 2021-08-13 DIAGNOSIS — K580 Irritable bowel syndrome with diarrhea: Secondary | ICD-10-CM | POA: Diagnosis not present

## 2021-08-13 DIAGNOSIS — E875 Hyperkalemia: Secondary | ICD-10-CM | POA: Diagnosis not present

## 2021-08-13 DIAGNOSIS — I6521 Occlusion and stenosis of right carotid artery: Secondary | ICD-10-CM | POA: Diagnosis not present

## 2021-08-13 DIAGNOSIS — R7303 Prediabetes: Secondary | ICD-10-CM | POA: Diagnosis not present

## 2021-08-13 DIAGNOSIS — G4700 Insomnia, unspecified: Secondary | ICD-10-CM | POA: Diagnosis not present

## 2021-08-13 DIAGNOSIS — R053 Chronic cough: Secondary | ICD-10-CM | POA: Diagnosis not present

## 2021-08-13 DIAGNOSIS — N1831 Chronic kidney disease, stage 3a: Secondary | ICD-10-CM | POA: Diagnosis not present

## 2021-08-13 DIAGNOSIS — E782 Mixed hyperlipidemia: Secondary | ICD-10-CM | POA: Diagnosis not present

## 2021-08-25 NOTE — Unmapped (Signed)
[facility] Refill Coordination Note    Specialty Medication(s) to be Shipped:   Transplant: Prograf 1mg     Other medication(s) to be shipped: No additional medications requested for fill at this time     Tamara Morris, DOB: [birth year]  Phone: [phone] (home)       All above HIPAA information was verified with patient.     Was a Nurse, learning disability used for this call? No    Completed refill call assessment today to schedule patient's medication shipment from the [facility] [phone]).  All relevant notes have been reviewed.     Specialty medication(s) and dose(s) confirmed: Regimen is correct and unchanged.   Changes to medications: Analeah reports starting the following medications: gabapentin  Changes to insurance: No  New side effects reported not previously addressed with a pharmacist or physician: None reported  Questions for the pharmacist: No    Confirmed patient received a Conservation officer, historic buildings and a Surveyor, mining with first shipment. The patient will receive a drug information handout for each medication shipped and additional FDA Medication Guides as required.       DISEASE/MEDICATION-SPECIFIC INFORMATION        N/A    SPECIALTY MEDICATION ADHERENCE     Medication Adherence    Patient reported X missed doses in the last month: 0  Specialty Medication: prograf 1mg   Patient is on additional specialty medications: No  Patient is on more than two specialty medications: No  Any gaps in refill history greater than 2 weeks in the last 3 months: no  Demonstrates understanding of importance of adherence: yes  Informant: patient  Reliability of informant: reliable  Provider-estimated medication adherence level: good  Patient is at risk for Non-Adherence: No  Reasons for non-adherence: no problems identified  Adherence tools used: patient uses a pill box to manage medications  Support network for adherence: family member  Confirmed plan for next specialty medication refill: delivery by pharmacy  Refills needed for supportive medications: not needed          Refill Coordination    Has the Patients' Contact Information Changed: No  Is the Shipping Address Different: No         Were doses missed due to medication being on hold? No    prograf 1 mg: 10 days of medicine on hand         REFERRAL TO PHARMACIST     Referral to the pharmacist: Not needed      [facility]     Shipping address confirmed in Epic.     Delivery Scheduled: Yes, Expected medication delivery date: 05/31.     Medication will be delivered via UPS to the prescription address in Epic WAM.    Antonietta Barcelona   [facility] Specialty Technician

## 2021-08-30 MED FILL — PROGRAF 1 MG CAPSULE: ORAL | 30 days supply | Qty: 60 | Fill #5

## 2021-09-28 NOTE — Unmapped (Signed)
[facility] Refill Coordination Note    Specialty Medication(s) to be Shipped:   Transplant: Prograf 1mg     Other medication(s) to be shipped: No additional medications requested for fill at this time     Tamara Morris, DOB: May 28, 1940  Phone: [phone] (home)       All above HIPAA information was verified with patient.     Was a Nurse, learning disability used for this call? No    Completed refill call assessment today to schedule patient's medication shipment from the [facility] [phone]).  All relevant notes have been reviewed.     Specialty medication(s) and dose(s) confirmed: Regimen is correct and unchanged.   Changes to medications: [name] reports no changes at this time.  Changes to insurance: No  New side effects reported not previously addressed with a pharmacist or physician: None reported  Questions for the pharmacist: No    Confirmed patient received a Conservation officer, historic buildings and a Surveyor, mining with first shipment. The patient will receive a drug information handout for each medication shipped and additional FDA Medication Guides as required.       DISEASE/MEDICATION-SPECIFIC INFORMATION        N/A    SPECIALTY MEDICATION ADHERENCE     Medication Adherence    Patient reported X missed doses in the last month: 0  Specialty Medication: Prograf 1mg   Patient is on additional specialty medications: No  Patient is on more than two specialty medications: No  Adherence tools used: patient uses a pill box to manage medications  Support network for adherence: family member              Were doses missed due to medication being on hold? No    Prograf 1 mg: 5-6 days of medicine on hand       REFERRAL TO PHARMACIST     Referral to the pharmacist: Not needed      [facility]     Shipping address confirmed in Epic.     Delivery Scheduled: Yes, Expected medication delivery date: 10/01/21.     Medication will be delivered via UPS to the prescription address in Epic WAM.    Nancy Nordmann [facility] Specialty Technician

## 2021-10-01 MED FILL — PROGRAF 1 MG CAPSULE: ORAL | 30 days supply | Qty: 60 | Fill #6

## 2021-10-11 ENCOUNTER — Ambulatory Visit: Admit: 2021-10-11 | Discharge: 2021-10-12 | Payer: MEDICARE

## 2021-10-11 DIAGNOSIS — E612 Magnesium deficiency: Secondary | ICD-10-CM | POA: Diagnosis not present

## 2021-10-11 DIAGNOSIS — Z5181 Encounter for therapeutic drug level monitoring: Secondary | ICD-10-CM | POA: Diagnosis not present

## 2021-10-11 DIAGNOSIS — Z944 Liver transplant status: Secondary | ICD-10-CM | POA: Diagnosis not present

## 2021-10-11 LAB — CBC W/ AUTO DIFF
BASOPHILS ABSOLUTE COUNT: 0.1 10*9/L (ref 0.0–0.2)
BASOPHILS RELATIVE PERCENT: 0.6 %
EOSINOPHILS ABSOLUTE COUNT: 0.3 10*9/L (ref 0.0–0.4)
EOSINOPHILS RELATIVE PERCENT: 3 %
HEMATOCRIT: 38.9 % (ref 34.0–44.0)
HEMOGLOBIN: 12.4 g/dL (ref 11.5–15.0)
LYMPHOCYTES ABSOLUTE COUNT: 2.6 10*9/L (ref 0.7–4.5)
LYMPHOCYTES RELATIVE PERCENT: 27.1 %
MEAN CORPUSCULAR HEMOGLOBIN CONC: 31.9 g/dL — ABNORMAL LOW (ref 32.0–36.0)
MEAN CORPUSCULAR HEMOGLOBIN: 30.5 pg (ref 27.0–34.0)
MEAN CORPUSCULAR VOLUME: 95.8 fL (ref 80.0–98.0)
MEAN PLATELET VOLUME: 10 fL (ref 7.4–10.4)
MONOCYTES ABSOLUTE COUNT: 0.8 10*9/L (ref 0.1–1.0)
MONOCYTES RELATIVE PERCENT: 8.7 %
NEUTROPHILS ABSOLUTE COUNT: 5.8 10*9/L (ref 1.8–7.8)
NEUTROPHILS RELATIVE PERCENT: 60.4 %
PLATELET COUNT: 250 10*9/L (ref 140–415)
RED BLOOD CELL COUNT: 4.06 10*12/L (ref 3.80–5.10)
RED CELL DISTRIBUTION WIDTH: 13.1 % (ref 11.5–14.5)
WBC ADJUSTED: 9.6 10*9/L (ref 4.0–10.5)

## 2021-10-11 LAB — COMPREHENSIVE METABOLIC PANEL
ALBUMIN: 3.9 g/dL (ref 3.5–5.0)
ALKALINE PHOSPHATASE: 86 U/L (ref 46–116)
ALT (SGPT): 29 U/L (ref 12–78)
ANION GAP: 8 mmol/L (ref 3–11)
AST (SGOT): 21 U/L (ref 15–40)
BILIRUBIN TOTAL: 0.5 mg/dL (ref 0.3–1.2)
BLOOD UREA NITROGEN: 27 mg/dL — ABNORMAL HIGH (ref 8–20)
BUN / CREAT RATIO: 18
CALCIUM: 9 mg/dL (ref 8.5–10.1)
CHLORIDE: 105 mmol/L (ref 98–107)
CO2: 27.5 mmol/L (ref 21.0–32.0)
CREATININE: 1.52 mg/dL — ABNORMAL HIGH (ref 0.60–1.10)
EGFR CKD-EPI (2021) FEMALE: 35 mL/min/{1.73_m2} — ABNORMAL LOW (ref >=60)
GLUCOSE RANDOM: 101 mg/dL (ref 70–179)
POTASSIUM: 4.8 mmol/L (ref 3.5–5.0)
PROTEIN TOTAL: 6.8 g/dL (ref 6.0–8.0)
SODIUM: 140 mmol/L (ref 135–145)

## 2021-10-11 LAB — PHOSPHORUS: PHOSPHORUS: 3.7 mg/dL (ref 2.4–5.1)

## 2021-10-11 LAB — GAMMA GT: GAMMA GLUTAMYL TRANSFERASE: 51 U/L — ABNORMAL HIGH (ref 0–38)

## 2021-10-11 LAB — BILIRUBIN, DIRECT: BILIRUBIN DIRECT: 0.14 mg/dL (ref 0.10–0.30)

## 2021-10-11 LAB — MAGNESIUM: MAGNESIUM: 1.8 mg/dL (ref 1.6–2.6)

## 2021-10-12 LAB — TACROLIMUS LEVEL, TROUGH: TACROLIMUS, TROUGH: 3.8 ng/mL — ABNORMAL LOW (ref 5.0–15.0)

## 2021-10-19 DIAGNOSIS — Z944 Liver transplant status: Principal | ICD-10-CM

## 2021-10-19 DIAGNOSIS — Z5181 Encounter for therapeutic drug level monitoring: Principal | ICD-10-CM

## 2021-10-20 ENCOUNTER — Ambulatory Visit: Admit: 2021-10-20 | Discharge: 2021-10-21 | Payer: MEDICARE

## 2021-10-20 DIAGNOSIS — G8929 Other chronic pain: Secondary | ICD-10-CM | POA: Diagnosis not present

## 2021-10-20 DIAGNOSIS — Z87891 Personal history of nicotine dependence: Secondary | ICD-10-CM | POA: Diagnosis not present

## 2021-10-20 DIAGNOSIS — Z944 Liver transplant status: Secondary | ICD-10-CM | POA: Diagnosis not present

## 2021-10-20 DIAGNOSIS — Z79899 Other long term (current) drug therapy: Secondary | ICD-10-CM | POA: Diagnosis not present

## 2021-10-20 DIAGNOSIS — Z79621 Long term (current) use of calcineurin inhibitor: Secondary | ICD-10-CM | POA: Diagnosis not present

## 2021-10-20 DIAGNOSIS — K7469 Other cirrhosis of liver: Secondary | ICD-10-CM | POA: Diagnosis not present

## 2021-10-20 DIAGNOSIS — Z7982 Long term (current) use of aspirin: Secondary | ICD-10-CM | POA: Diagnosis not present

## 2021-10-20 DIAGNOSIS — D849 Immunodeficiency, unspecified: Secondary | ICD-10-CM | POA: Diagnosis not present

## 2021-10-20 DIAGNOSIS — Z23 Encounter for immunization: Secondary | ICD-10-CM | POA: Diagnosis not present

## 2021-10-20 DIAGNOSIS — Z09 Encounter for follow-up examination after completed treatment for conditions other than malignant neoplasm: Secondary | ICD-10-CM | POA: Diagnosis not present

## 2021-10-20 NOTE — Unmapped (Addendum)
Pt was seen by Vallery Sa ANP for annual follow up visit, 79yrs post liver transplant. Pt denies any n/v/d/f/abd pain. Pt denies any illnesses or hospitalizations in the past year. Pt is retired with mobility limitations due to back pain. Pt uses cane/walker to ambulate. Pt expressed concerns over the size of her walker in relation to her space. Advised her to have PCP refer her to PT so they can help with the appropriate assistive equipment. Pt reports eating healthy and intentional weight loss of over 20lbs. Applauded pt on the weight loss and how she continues to manage her health. Pt reports seeing dermatologist in the past year with no skin cancer concerns. Will continue with labs every 3 months and return to clinic in a year. Pt verbalized understanding of all information discussed. Spent 15 minutes on pt education. Pneumonia vaccine to be given this visit. Medication list reviewed by intake nurse.

## 2021-10-20 NOTE — Unmapped (Signed)
FOLLOW UP ANNUAL LIVER CLINIC NOTE     Patient Name: Tamara Morris  Medical Record Number: [id number]  Date of Service: 10/20/2021    Referring Physician: Info Unavailable Referr*   Current complaint: Follow up Annual Liver    Assessment/Plan:     Delilia Teaff is a 82 y.o. 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  Recent testing/images stable, immunosuppressive levels were within goal range (~3).  Plan to continueTacrolimus 1mg  BID.    NSAID use, very sparingly - a few times a month at most for uncontrollable back pain. Recommended more hydration on days she uses these; look into a TENS unit for pain relief. Encouraged her to continue close follow up with local pain clinic.    HEALTH MAINTENANCE:   - Dermatology: This patient is at increased risk for the development of skin cancers due to immunosuppressant medications. We recommend yearly surveillance.  - Mammogram: annual   - Dental: annual  - Mental health: no issues  Immunization History   Administered Date(s) Administered    COVID-19 VAC,BIVALENT,MODERNA(BLUE CAP) 02/06/2021    COVID-19 VACCINE,MRNA(MODERNA)(PF) 05/29/2019, 06/26/2019, 11/22/2019    SHINGRIX-ZOSTER VACCINE (HZV), RECOMBINANT,SUB-UNIT,ADJUVANTED IM 11/25/2016    TdaP 01/27/2015     Prevnar 20 given in clinic today.   Will receive flu shot this fall.     Return to clinic: 1 year  Labs: monthly    I spent a total of 30 minutes of which 50% was spent in counseling and coordination of care.    Subjective:     HPI: Tamara Morris is a 81 y.o. female who underwent liver transplant on 03/06/1993 for cryptogenic cirrhosis c/b graft nonfunction s/p second transplant 03/08/1993 who presents for annual evaluation. She has had minimal changes to health since previous visit.     She was diagnosed with moderate r carotid stenosis with less than 50% l carotid stenosis after assessed to have a carotid bruit. She is asymptomatic and on ASA and simvastatin with good control. Denies chest pain, SOB, N/V or abdominal pain. She states she has IBS and frequent stooling that does not cause her any problems. She denies acute complaint today, but does report persistent chronic back pain, largely unchanged.     PMH and PSH reviewed     Social History     Socioeconomic History    Marital status: Divorced   Tobacco Use    Smoking status: Former     Types: Cigarettes     Quit date: 11/02/1988     Years since quitting: 32.9    Smokeless tobacco: Never       REVIEW OF SYSTEMS:   The balance of 10/12 systems is negative with the exception of HPI.    Objective:     MEDICATIONS:  Allergies   Allergen Reactions    Codeine Other (See Comments)    Hydromorphone Other (See Comments)    Meperidine Other (See Comments)       Current Outpatient Medications   Medication Sig Dispense Refill    acetaminophen (TYLENOL) 325 MG tablet Take 500 mg by mouth every six (6) hours as needed for pain.       aspirin (ECOTRIN) 81 MG tablet Take 81 mg by mouth daily.      cholecalciferol, vitamin D3 25 mcg, 1,000 units,, (CHOLECALCIFEROL-25 MCG, 1,000 UNIT,) 1,000 unit (25 mcg) tablet Take 25 mcg by mouth daily.      gabapentin (NEURONTIN) 100  MG capsule Take 100 mg by mouth nightly.      hydrALAZINE (APRESOLINE) 25 MG tablet Take 25 mg by mouth Three (3) times a day.       loperamide (IMODIUM A-D) 2 mg tablet Take 2 mg by mouth. Frequency:PRN   Dosage:2   MG  Instructions:  Note:Dose: 2MG       LORazepam (ATIVAN) 0.5 MG tablet Take 0.5 mg by mouth. Frequency:PRN   Dosage:0.5   MG  Instructions:  Note:Dose: 0.5MG       losartan (COZAAR) 100 MG tablet Take 100 mg by mouth daily.      olmesartan (BENICAR) 20 MG tablet Take 20 mg by mouth daily.      simvastatin (ZOCOR) 10 MG tablet Take 10 mg by mouth nightly.      tacrolimus (PROGRAF) 1 MG capsule Take 1 capsule (1 mg total) by mouth two (2) times a day. 60 capsule 11    verapamil (CALAN-SR) 240 MG CR tablet No current facility-administered medications for this visit.         PHYSICAL EXAM:  Vitals:    10/20/21 0946   BP: 161/87   BP Site: R Arm   BP Position: Sitting   Pulse: 88   Temp: 36.6 ??C (97.9 ??F)   TempSrc: Tympanic   SpO2: 95%   Weight: 86.6 kg (191 lb)   Height: 162.6 cm (5' 4)          General Appearance:  NAD, well appearing and well nourished.   HEENT:  [place]/AT. Well hydrated moist mucous membranes of the oral cavity. No scleral icterus. No cervical lymphadenopathy.   Pulmonary:    Normal respiratory effort. CTAB, without wheezes/crackles/rhonchi. Good air movement.    Cardiovascular:  Regular rate and rhythm, no murmur noted.   Extremities No edema.    Abdomen:   Normoactive bowel sounds, abdomen soft, non-tender and not distended, no Hepatosplenomegaly or masses. Abdominal scar well healed without hernia.    Musculoskeletal: No joint tenderness, full ROM. Normal gait.    Skin: Skin color, texture, turgor normal, no rashes or lesions. Skin tag to R upper chest wall ~0.5cm diameter   Neurologic: Grossly intact.   Psychiatric: Judgement and insight appropriate.        LAB RESULTS:  Results for orders placed or performed in visit on 10/11/21   Comprehensive Metabolic Panel   Result Value Ref Range    Sodium 140 135 - 145 mmol/L    Potassium 4.8 3.5 - 5.0 mmol/L    Chloride 105 98 - 107 mmol/L    CO2 27.5 21.0 - 32.0 mmol/L    Anion Gap 8 3 - 11 mmol/L    BUN 27 (H) 8 - 20 mg/dL    Creatinine 5.40 (H) 0.60 - 1.10 mg/dL    BUN/Creatinine Ratio 18     eGFR CKD-EPI (2021) Female 35 (L) >=60 mL/min/1.34m2    Glucose 101 70 - 179 mg/dL    Calcium 9.0 8.5 - 98.1 mg/dL    Albumin 3.9 3.5 - 5.0 g/dL    Total Protein 6.8 6.0 - 8.0 g/dL    Total Bilirubin 0.5 0.3 - 1.2 mg/dL    AST 21 15 - 40 U/L    ALT 29 12 - 78 U/L    Alkaline Phosphatase 86 46 - 116 U/L   Bilirubin, Direct   Result Value Ref Range    Bilirubin, Direct 0.14 0.10 - 0.30 mg/dL   Phosphorus Level   Result Value Ref Range  Phosphorus 3.7 2.4 - 5.1 mg/dL   Magnesium Level   Result Value Ref Range    Magnesium 1.8 1.6 - 2.6 mg/dL   Gamma GT   Result Value Ref Range    GGT 51 (H) 0 - 38 U/L   Tacrolimus Level, Trough   Result Value Ref Range    Tacrolimus, Trough 3.8 (L) 5.0 - 15.0 ng/mL   CBC w/ Differential   Result Value Ref Range    WBC 9.6 4.0 - 10.5 10*9/L    RBC 4.06 3.80 - 5.10 10*12/L    HGB 12.4 11.5 - 15.0 g/dL    HCT 63.8 75.6 - 43.3 %    MCV 95.8 80.0 - 98.0 fL    MCH 30.5 27.0 - 34.0 pg    MCHC 31.9 (L) 32.0 - 36.0 g/dL    RDW 29.5 18.8 - 41.6 %    MPV 10.0 7.4 - 10.4 fL    Platelet 250 140 - 415 10*9/L    Neutrophils % 60.4 %    Lymphocytes % 27.1 %    Monocytes % 8.7 %    Eosinophils % 3.0 %    Basophils % 0.6 %    Absolute Neutrophils 5.8 1.8 - 7.8 10*9/L    Absolute Lymphocytes 2.6 0.7 - 4.5 10*9/L    Absolute Monocytes 0.8 0.1 - 1.0 10*9/L    Absolute Eosinophils 0.3 0.0 - 0.4 10*9/L    Absolute Basophils 0.1 0.0 - 0.2 10*9/L     *Note: Due to a large number of results and/or encounters for the requested time period, some results have not been displayed. A complete set of results can be found in Results Review.             IMAGING:  Xr Chest 2 Views  Result Date: 10/20/2017   Radiographically clear lungs. No pleural effusion or pneumothorax. Unremarkable cardiomediastinal silhouette.     US Liver Transplant  Result Date: 10/20/2017  - Patent hepatic transplant vasculature, with normal flow at the main portal vein anastomosis.   - Mildly elevated resistive indices in the hepatic transplant arteries, similar to prior.      _______________________________________________  Vallery Sa, NP-C  [facility]

## 2021-10-20 NOTE — Unmapped (Signed)
Per provider, the patient received pneumococcal vaccine.  Patient ID verified with name and date of birth.  All screening questions were answered.  Vaccine(s) were administered as ordered.  See immunization history for documentation.  Patient tolerated the injection(s) well with no issues noted.  Vaccine Information sheet given to the patient.

## 2021-10-27 NOTE — Unmapped (Signed)
[facility] Clinical Assessment & Refill Coordination Note    Tamara Morris, DOB: December 31, 1940  Phone: [phone] (home)     All above HIPAA information was verified with patient.     Was a Nurse, learning disability used for this call? No    Specialty Medication(s):   Transplant: Prograf 1mg      Current Outpatient Medications   Medication Sig Dispense Refill   ??? acetaminophen (TYLENOL) 325 MG tablet Take 500 mg by mouth every six (6) hours as needed for pain.      ??? aspirin (ECOTRIN) 81 MG tablet Take 1 tablet (81 mg total) by mouth daily.     ??? cholecalciferol, vitamin D3 25 mcg, 1,000 units,, 1,000 unit (25 mcg) tablet Take 1 tablet (25 mcg total) by mouth daily.     ??? gabapentin (NEURONTIN) 100 MG capsule Take 1 capsule (100 mg total) by mouth nightly.     ??? hydrALAZINE (APRESOLINE) 25 MG tablet Take 1 tablet (25 mg total) by mouth Three (3) times a day.     ??? loperamide (IMODIUM A-D) 2 mg tablet Take 1 tablet (2 mg total) by mouth. Frequency:PRN   Dosage:2   MG  Instructions:  Note:Dose: 2MG      ??? LORazepam (ATIVAN) 0.5 MG tablet Take 1 tablet (0.5 mg total) by mouth continuous as needed.     ??? losartan (COZAAR) 100 MG tablet Take 1 tablet (100 mg total) by mouth daily.     ??? olmesartan (BENICAR) 20 MG tablet Take 20 mg by mouth daily.     ??? simvastatin (ZOCOR) 10 MG tablet Take 10 mg by mouth nightly. (Patient not taking: Reported on 10/20/2021)     ??? tacrolimus (PROGRAF) 1 MG capsule Take 1 capsule (1 mg total) by mouth two (2) times a day. 60 capsule 11   ??? verapamil (CALAN-SR) 240 MG CR tablet        No current facility-administered medications for this visit.        Changes to medications: Everetta reports no changes at this time.    Allergies   Allergen Reactions   ??? Codeine Other (See Comments)   ??? Hydromorphone Other (See Comments)   ??? Meperidine Other (See Comments)       Changes to allergies: No    SPECIALTY MEDICATION ADHERENCE     Prograf 1mg   : 5 days of medicine on hand Medication Adherence    Patient reported X missed doses in the last month: 0  Specialty Medication: prograf 1mg   Adherence tools used: patient uses a pill box to manage medications  Support network for adherence: family member          Specialty medication(s) dose(s) confirmed: Regimen is correct and unchanged.     Are there any concerns with adherence? No    Adherence counseling provided? Not needed    CLINICAL MANAGEMENT AND INTERVENTION      Clinical Benefit Assessment:    Do you feel the medicine is effective or helping your condition? Yes    Clinical Benefit counseling provided? Not needed    Adverse Effects Assessment:    Are you experiencing any side effects? No    Are you experiencing difficulty administering your medicine? No    Quality of Life Assessment:         How many days over the past month did your transplant  keep you from your normal activities? For example, brushing your teeth or getting up in the morning. 0  Have you discussed this with your provider? Not needed    Acute Infection Status:    Acute infections noted within Epic:  No active infections  Patient reported infection: None    Therapy Appropriateness:    Is therapy appropriate and patient progressing towards therapeutic goals? Yes, therapy is appropriate and should be continued    DISEASE/MEDICATION-SPECIFIC INFORMATION      N/A    PATIENT SPECIFIC NEEDS     - Does the patient have any physical, cognitive, or cultural barriers? No    - Is the patient high risk? No    - Does the patient require a Care Management Plan? No     SOCIAL DETERMINANTS OF HEALTH     At the [facility], we have learned that life circumstances - like trouble affording food, housing, utilities, or transportation can affect the health of many of our patients.   That is why we wanted to ask: are you currently experiencing any life circumstances that are negatively impacting your health and/or quality of life? No    Social Determinants of Conservation officer, historic buildings Strain: Not on file   Internet Connectivity: Not on file   Food Insecurity: Not on file   Tobacco Use: Medium Risk   ??? Smoking Tobacco Use: Former   ??? Smokeless Tobacco Use: Never   ??? Passive Exposure: Not on file   Housing/Utilities: Not on file   Alcohol Use: Not on file   Transportation Needs: Not on file   Substance Use: Not on file   Health Literacy: Not on file   Physical Activity: Not on file   Interpersonal Safety: Not on file   Stress: Not on file   Intimate Partner Violence: Not on file   Depression: Not on file   Social Connections: Not on file       Would you be willing to receive help with any of the needs that you have identified today? Not applicable       SHIPPING     Specialty Medication(s) to be Shipped:   Transplant: Prograf 1mg     Other medication(s) to be shipped: No additional medications requested for fill at this time     Changes to insurance: No    Delivery Scheduled: Yes, Expected medication delivery date: 10/29/2021.     Medication will be delivered via UPS to the confirmed prescription address in [facility].    The patient will receive a drug information handout for each medication shipped and additional FDA Medication Guides as required.  Verified that patient has previously received a Conservation officer, historic buildings and a Surveyor, mining.    The patient or caregiver noted above participated in the development of this care plan and knows that they can request review of or adjustments to the care plan at any time.      All of the patient's questions and concerns have been addressed.    Thad Ranger   [facility] Specialty Pharmacist

## 2021-10-28 MED FILL — PROGRAF 1 MG CAPSULE: ORAL | 30 days supply | Qty: 60 | Fill #7

## 2021-11-12 DIAGNOSIS — E782 Mixed hyperlipidemia: Secondary | ICD-10-CM | POA: Diagnosis not present

## 2021-11-12 DIAGNOSIS — E559 Vitamin D deficiency, unspecified: Secondary | ICD-10-CM | POA: Diagnosis not present

## 2021-11-12 DIAGNOSIS — N1831 Chronic kidney disease, stage 3a: Secondary | ICD-10-CM | POA: Diagnosis not present

## 2021-11-12 DIAGNOSIS — R7303 Prediabetes: Secondary | ICD-10-CM | POA: Diagnosis not present

## 2021-11-19 DIAGNOSIS — R5383 Other fatigue: Secondary | ICD-10-CM | POA: Diagnosis not present

## 2021-11-19 DIAGNOSIS — G4700 Insomnia, unspecified: Secondary | ICD-10-CM | POA: Diagnosis not present

## 2021-11-19 DIAGNOSIS — N1831 Chronic kidney disease, stage 3a: Secondary | ICD-10-CM | POA: Diagnosis not present

## 2021-11-19 DIAGNOSIS — I6521 Occlusion and stenosis of right carotid artery: Secondary | ICD-10-CM | POA: Diagnosis not present

## 2021-11-19 DIAGNOSIS — I10 Essential (primary) hypertension: Secondary | ICD-10-CM | POA: Diagnosis not present

## 2021-11-19 DIAGNOSIS — R053 Chronic cough: Secondary | ICD-10-CM | POA: Diagnosis not present

## 2021-11-19 DIAGNOSIS — F411 Generalized anxiety disorder: Secondary | ICD-10-CM | POA: Diagnosis not present

## 2021-11-19 DIAGNOSIS — K580 Irritable bowel syndrome with diarrhea: Secondary | ICD-10-CM | POA: Diagnosis not present

## 2021-11-19 DIAGNOSIS — R7303 Prediabetes: Secondary | ICD-10-CM | POA: Diagnosis not present

## 2021-11-19 DIAGNOSIS — E875 Hyperkalemia: Secondary | ICD-10-CM | POA: Diagnosis not present

## 2021-11-19 DIAGNOSIS — E782 Mixed hyperlipidemia: Secondary | ICD-10-CM | POA: Diagnosis not present

## 2021-11-19 DIAGNOSIS — M545 Low back pain, unspecified: Secondary | ICD-10-CM | POA: Diagnosis not present

## 2021-11-19 DIAGNOSIS — Z944 Liver transplant status: Secondary | ICD-10-CM | POA: Diagnosis not present

## 2021-11-26 NOTE — Unmapped (Signed)
[facility] Refill Coordination Note    Specialty Medication(s) to be Shipped:   Transplant: Prograf 1mg     Other medication(s) to be shipped: No additional medications requested for fill at this time     Tamara Morris, DOB: 1940/06/13  Phone: [phone] (home)       All above HIPAA information was verified with patient.     Was a Nurse, learning disability used for this call? No    Completed refill call assessment today to schedule patient's medication shipment from the [facility] [phone]).  All relevant notes have been reviewed.     Specialty medication(s) and dose(s) confirmed: Regimen is correct and unchanged.   Changes to medications: [name] reports no changes at this time.  Changes to insurance: No  New side effects reported not previously addressed with a pharmacist or physician: None reported  Questions for the pharmacist: No    Confirmed patient received a Conservation officer, historic buildings and a Surveyor, mining with first shipment. The patient will receive a drug information handout for each medication shipped and additional FDA Medication Guides as required.       DISEASE/MEDICATION-SPECIFIC INFORMATION        N/A    SPECIALTY MEDICATION ADHERENCE     Medication Adherence    Patient reported X missed doses in the last month: 0  Specialty Medication: PROGRAF 1 MG  Patient is on additional specialty medications: No      Adherence tools used: patient uses a pill box to manage medications      Support network for adherence: family member                    Were doses missed due to medication being on hold? No    PROGRAF 1 mg: 14 days of medicine on hand       REFERRAL TO PHARMACIST     Referral to the pharmacist: Not needed      [facility]     Shipping address confirmed in Epic.     Delivery Scheduled: Yes, Expected medication delivery date: 12/08/21.     Medication will be delivered via UPS to the prescription address in Epic WAM.    Ernestine Mcmurray   [facility] Specialty Technician

## 2021-12-02 DIAGNOSIS — E782 Mixed hyperlipidemia: Secondary | ICD-10-CM | POA: Diagnosis not present

## 2021-12-02 DIAGNOSIS — I10 Essential (primary) hypertension: Secondary | ICD-10-CM | POA: Diagnosis not present

## 2021-12-06 MED FILL — PROGRAF 1 MG CAPSULE: ORAL | 30 days supply | Qty: 60 | Fill #8

## 2021-12-28 NOTE — Unmapped (Signed)
[facility] Refill Coordination Note    Specialty Medication(s) to be Shipped:   Transplant: Prograf 1mg     Other medication(s) to be shipped: No additional medications requested for fill at this time     Tamara Morris, DOB: [birth year]  Phone: [phone] (home)       All above HIPAA information was verified with patient.     Was a Nurse, learning disability used for this call? No    Completed refill call assessment today to schedule patient's medication shipment from the [facility] [phone]).  All relevant notes have been reviewed.     Specialty medication(s) and dose(s) confirmed: Regimen is correct and unchanged.   Changes to medications: [name] reports no changes at this time.  Changes to insurance: No  New side effects reported not previously addressed with a pharmacist or physician: None reported  Questions for the pharmacist: No    Confirmed patient received a Conservation officer, historic buildings and a Surveyor, mining with first shipment. The patient will receive a drug information handout for each medication shipped and additional FDA Medication Guides as required.       DISEASE/MEDICATION-SPECIFIC INFORMATION        N/A    SPECIALTY MEDICATION ADHERENCE     Medication Adherence    Patient reported X missed doses in the last month: 0  Specialty Medication: Prograf 1mg   Patient is on additional specialty medications: No      Adherence tools used: patient uses a pill box to manage medications      Support network for adherence: family member                    Were doses missed due to medication being on hold? No    Prograf 1 mg: 8 days of medicine on hand       REFERRAL TO PHARMACIST     Referral to the pharmacist: Not needed      [facility]     Shipping address confirmed in Epic.     Delivery Scheduled: Yes, Expected medication delivery date: 01/03/22.     Medication will be delivered via UPS to the prescription address in Epic WAM.    Tera Helper, [facility]   [facility] Specialty Pharmacist

## 2021-12-30 DIAGNOSIS — Z23 Encounter for immunization: Secondary | ICD-10-CM | POA: Diagnosis not present

## 2021-12-30 DIAGNOSIS — M25562 Pain in left knee: Principal | ICD-10-CM

## 2021-12-31 MED FILL — PROGRAF 1 MG CAPSULE: ORAL | 30 days supply | Qty: 60 | Fill #9

## 2022-02-01 NOTE — Unmapped (Signed)
[facility] Refill Coordination Note    Specialty Medication(s) to be Shipped:   Transplant: Prograf 1mg     Other medication(s) to be shipped: No additional medications requested for fill at this time     Tamara Morris, DOB: 03/17/1941  Phone: [phone] (home)       All above HIPAA information was verified with patient.     Was a Nurse, learning disability used for this call? No    Completed refill call assessment today to schedule patient's medication shipment from the [facility] [phone]).  All relevant notes have been reviewed.     Specialty medication(s) and dose(s) confirmed: Regimen is correct and unchanged.   Changes to medications: Tamara Morris reports no changes at this time.  Changes to insurance: No  New side effects reported not previously addressed with a pharmacist or physician: None reported  Questions for the pharmacist: No    Confirmed patient received a Conservation officer, historic buildings and a Surveyor, mining with first shipment. The patient will receive a drug information handout for each medication shipped and additional FDA Medication Guides as required.       DISEASE/MEDICATION-SPECIFIC INFORMATION        N/A    SPECIALTY MEDICATION ADHERENCE     Medication Adherence    Patient reported X missed doses in the last month: 0  Specialty Medication: prograf 1 mg  Patient is on additional specialty medications: No  Patient is on more than two specialty medications: No  Any gaps in refill history greater than 2 weeks in the last 3 months: no  Demonstrates understanding of importance of adherence: yes  Informant: patient  Reliability of informant: reliable  Provider-estimated medication adherence level: good  Patient is at risk for Non-Adherence: No  Reasons for non-adherence: no problems identified      Adherence tools used: patient uses a pill box to manage medications      Support network for adherence: family member                    Were doses missed due to medication being on hold? No    PROGRAF 1 MG capsule (tacrolimus)  : 7 days of medicine on hand       REFERRAL TO PHARMACIST     Referral to the pharmacist: Not needed      [facility]     Shipping address confirmed in Epic.     Delivery Scheduled: Yes, Expected medication delivery date: 02/08/22.     Medication will be delivered via UPS to the prescription address in Epic WAM.    Milina Pagett' [facility] Specialty Technician

## 2022-02-07 MED FILL — PROGRAF 1 MG CAPSULE: ORAL | 30 days supply | Qty: 60 | Fill #10

## 2022-02-10 ENCOUNTER — Ambulatory Visit: Admit: 2022-02-10 | Discharge: 2022-02-11 | Payer: MEDICARE | Attending: Anesthesiology | Primary: Anesthesiology

## 2022-02-10 DIAGNOSIS — M25562 Pain in left knee: Secondary | ICD-10-CM | POA: Diagnosis not present

## 2022-02-10 DIAGNOSIS — M7061 Trochanteric bursitis, right hip: Secondary | ICD-10-CM | POA: Diagnosis not present

## 2022-02-10 DIAGNOSIS — M1611 Unilateral primary osteoarthritis, right hip: Secondary | ICD-10-CM | POA: Diagnosis not present

## 2022-02-10 DIAGNOSIS — M545 Chronic low back pain without sciatica, unspecified back pain laterality: Principal | ICD-10-CM

## 2022-02-10 DIAGNOSIS — G8929 Other chronic pain: Secondary | ICD-10-CM | POA: Diagnosis not present

## 2022-02-10 NOTE — Unmapped (Addendum)
Department of Anesthesiology  [facility]  [address]  [place], [place] [address]  [phone]      Date: February 10, 2022  Patient Name: Tamara Morris  MRN: [id number]  PCP: Stephens Shire  Referring Provider: Lupita Raider, FNP    Assessment:   Attending: Krystol Rocco is a 81 y.o. female with a PMHx significant for anxiety, arthritis, chronic back pain, gout, hyperlipidemia, and Hx of liver transplant. [facility]. She was previously seen by our clinic in 2017 for multiple pain complaints. Has a long history of injections -of which, the effects of the SI joint injections were short-lived, her RFA to assist with SI joint pain was minimally beneficial; her last intervention was a greater trochanteric bursa injection and this helped the pain in her lateral hip and tightness going down her leg.  Her primary pain generators include advanced right hip osteoarthritis, right knee osteoarthritis, SI joint dysfunction, postlaminectomy pain syndrome.    1. Bilateral knee pain (R>L), unspecified chronicity  Patient with bilateral knee pain, though endorses right knee is worse than the left. Has tried Voltaren gel in the past. Encouraged she try Voltaren gel again. Encourage she avoid NSAIDs and Tylenol given Hx of liver transplant. Encouraged knee compression sleeve to help with swelling and inflammation.     2. Low back pain  Reports low back pain that radiates to her right buttock, right hip, and RLE to the extent of her right knee. Endorses benefit with leaning forward when walking. Worsened within the last 10 months. Patient has not completed PT in the last several years and we agreed to place referral today. No appreciable benefit from SIJ injections in the past. Was started on gabapentin, though patient has not been taking as prescribed as she is hesitant towards medications. 3. Right hip pain   Patient has right hip pain with difficulty walking due to pain. Has history of bursa injections with appreciable benefit. Discussed repeat GBT injection given benefit in the past assuming she fails PT.  Patient and daughter slightly frustrated this can't be done sooner, but we discussed given how long its been since she last did PT, that is a requirement before considering an injection in case its significantly beneficial. She also would benefit from PT from other her pain complaints.      4. Bilateral hand numbness  Endorses bilateral hand numbness (R>L) that started about a year ago. Notes worsened with using canes. Denies Hx of DM or hand/wrist surgeries.  Phalen's and Tinel's negative bilaterally.  Patient reports appropriate glycemic control without diabetes on labs that were drawn recently by primary care; they were not reviewed.     Patient does  appear to be utilizing pain medications appropriately and does  report that the medications do improve patient's quality of life and functionality level.  At today's visit, the patient reports poor analgesia from their current medication regimen without significant adverse effects.    Current Pain Provider: no  Urine toxicology: None  Urine toxicology screen N/A appropriate.   Last Opioid Change: N/A  Last EKG: N/A  Previous Compliance Issues: None  Naloxone ordered: N/A  Total morphine equivalents:   Benzodiazepine: No.  Pain Psychology: N/A and Consider referral in the future  [place] DOC: None  [place]CCS[place] database was reviewed 02/10/22.      1. Chronic low back pain without sciatica, unspecified back  pain laterality    2. Left knee pain, unspecified chronicity    3. Greater trochanteric bursitis of right hip    4. Primary osteoarthritis of right hip        General Recommendations: The pain condition that the patient suffers from is best treated with a multidisciplinary approach that involves an increase in physical activity to prevent de-conditioning and worsening of the pain cycle, as well as psychological counseling (formal and/or informal) to address the co-morbid psychological affects of pain.  Treatment will often involve judicious use of pain medications and interventional procedures to decrease the pain, allowing the patient to participate in the physical activity that will ultimately produce long-lasting pain reductions.  The goal of the multidisciplinary approach is to return the patient to a higher level of overall function and to restore their ability to perform activities of daily living.      Plan:     -Please note that if you have referred this patient for opioid management, that we will not prescribe any controlled substances until our evaluation is complete, satisfactory, and we have assumed opioid prescribing duties with an opioid agreement. As such, any controlled substances must come from the referring provider if indicated.     - Referral to home physical therapy (instructed patient to check in with insurance as well as primary care physician in order to facilitate set up; however, our team would be happy to provide a prescription for physical therapy)  - Start Voltaren gel 1% or generic version called diclofenac gel 1% 4 times daily  - Start TENS unit on areas of the neck and shoulder for 20-30 minutes up to 5 times per day  --Continue gabapentin as prescribed by primary care physician    Future Considerations:  -If patient is having minimal improvement with physical therapy or baseline level of analgesia as not at goal, then we can consider ordering a greater trochanteric bursa injection on the right  -Caution with medications due to CKD and hx Liver Tx    -Return in about 3 months (around 05/13/2022) for MD, NP .    No orders of the defined types were placed in this encounter.    Requested Prescriptions      No prescriptions requested or ordered in this encounter         Subjective:     HPI:  [name] is seen in consultation at the request of Lupita Raider, FNP  For evaluation and recommendations regarding Her chronic pain.     Today, the patient reports low back pain (R>L) for several years. She confirms following with our clinic previously and notes the pain has recently worsened within the last 10 months. The patient reports her pain radiates to her right buttocks, right hip, and down her right leg to the extent of her right knee. She describes the pain as burning. She reports difficulty walking due to pain. The patient reports leaning forward when walking with benefit. She denies any recent falls. The patient has a history of injections with our clinic. She notes benefit from previous hip injections for an appreciable amount of time. The patient reports very intermittent numbness and tingling in her toes. She denies appreciable benefit from nerve blocks in the past with our clinic. The patient confirms having SIJ injections in the past. She denies having PT within the last several years. The patient reports sleeping in a recliner for the last several years. She endorses benefit with sitting. The patient reports she does  yard work and cooking. She reports worsened pain with standing for long periods of time. The patient notes she has someone that lives with her. They confirm she tried TENS in the past.     The patient also notes numbness in mostly all of her fingertips, especially right thumb (R>L). She reports dropping things due to numbness. The patient denies DM or history of hand/wrist surgery. The patient notes worsened with using canes and walkers.     She confirms bilateral knee pain (R>L). The patient notes history of seeing providers about replacement, though denies seeing any recently. She notes swelling on her right knee.     In terms of medications, the patient reports discontinuing gabapentin as she was hesitant to take it in the first place. The patient reports taking Tylenol very sparingly with some benefit. She reports trying Voltaren gel in the past. The patient reports drowsiness with gabapentin during the day, though denies sedation when taking at nighttime.     Pain Clinic? Yes, in past    Current Medications:  Gabapentin 100 mg TID- not currently taking   Voltaren gel    Her pain started approximately several years ago.  Her pain involving bilateral hands and lower back right buttock, right hip, and RLE.    Her pain is described as aching, burning, tender, and throbbing  Ranesha Val is  presenting to the clinic today with pain that is graded as 5/10 in intensity. Her highest level of pain is reported as 10/10 in intensity, least pain level is 8/10 and average pain level is 5/10.  Her pain started Over time.  The patient has not experienced lost of bowel or bladder control.  Her pain is made worse with lying, standing, and walking.  Her pain is present sometimes   Her pain is improved with massage, heat, and ice.     Previous interventions include PT, Medications, Epidural Injections, Other Injections, and Nerve Blocks    Workmans Compensation is not involved.  This is not involved with a lawsuit or lawyer    Previous Medication Trials: gabapentin and lidoderm  Surgical History    Surgical History  Surgery Date Site/Laterality Comments   LIVER TRANSPLANT         ABDOMINAL HYSTERECTOMY         APPENDECTOMY         BACK SURGERY     x3; plates and screws    CATARACT EXTRACTION W/PHACO 06/27/2016 Eye/Left Procedure: CATARACT EXTRACTION PHACO AND INTRAOCULAR LENS PLACEMENT (IOC); Surgeon: Gemma Payor, MD; Location: [facility]; Service: Ophthalmology; Laterality: Left; CDE: 7.94   Medical devices from this surgery are in the Medical Devices section.    CATARACT EXTRACTION W/PHACO 07/28/2016 Eye/Right Procedure: CATARACT EXTRACTION PHACO AND INTRAOCULAR LENS PLACEMENT RIGHT EYE CDE=6.33; Surgeon: Gemma Payor, MD; Location: [facility]; Service: Ophthalmology; Laterality: Right; right   Medical devices from this surgery are in the Medical Devices section.      Medical History    Medical History  Medical History Date Comments   Hx of liver transplant (HCC)   1994, 1995 at [facility]   Chronic back pain       Palpitations       Kidney stones       Hyperlipidemia       History of kidney stones       Anxiety       Arthritis       Gout       Essential hypertension  History of ventricular Big[place], followed up with cardiologist ,     Family History    Family History  Medical History Relation Name Comments   Stroke Mother       Stroke Other         Family History  Relation Name Status Comments   Mother         Other          Social History:  She reports that she quit smoking about 33 years ago. Her smoking use included cigarettes. She has never used smokeless tobacco.    Current Employment: Disabled  Exercise: None  Recreational Drugs: No  Treatment for Substance abuse: No  Use anothers prescription medications: No    Allergies as of 02/10/2022 - Reviewed 02/10/2022   Allergen Reaction Noted    Codeine Other (See Comments) 09/07/2012    Hydromorphone Other (See Comments) 09/07/2012    Meperidine Other (See Comments) 09/07/2012      Current Outpatient Medications   Medication Sig Dispense Refill    acetaminophen (TYLENOL) 325 MG tablet Take 500 mg by mouth every six (6) hours as needed for pain.       aspirin (ECOTRIN) 81 MG tablet Take 1 tablet (81 mg total) by mouth daily.      atorvastatin (LIPITOR) 40 MG tablet Take 1 tablet (40 mg total) by mouth daily.      cholecalciferol, vitamin D3 25 mcg, 1,000 units,, 1,000 unit (25 mcg) tablet Take 1 tablet (25 mcg total) by mouth daily.      gabapentin (NEURONTIN) 100 MG capsule Take 1 capsule (100 mg total) by mouth nightly.      hydrALAZINE (APRESOLINE) 25 MG tablet Take 1 tablet (25 mg total) by mouth Three (3) times a day.      loperamide (IMODIUM A-D) 2 mg tablet Take 1 tablet (2 mg total) by mouth. Frequency:PRN   Dosage:2   MG  Instructions:  Note:Dose: 2MG       LORazepam (ATIVAN) 0.5 MG tablet Take 1 tablet (0.5 mg total) by mouth continuous as needed.      losartan (COZAAR) 100 MG tablet Take 1 tablet (100 mg total) by mouth daily.      olmesartan (BENICAR) 20 MG tablet Take 1 tablet (20 mg total) by mouth daily.      tacrolimus (PROGRAF) 1 MG capsule Take 1 capsule (1 mg total) by mouth two (2) times a day. 60 capsule 11    verapamil (CALAN-SR) 240 MG CR tablet       simvastatin (ZOCOR) 10 MG tablet Take 10 mg by mouth nightly. (Patient not taking: Reported on 10/20/2021)       No current facility-administered medications for this visit.       Imaging/Tests:   MRI Lumbar Spine 12/11/15  FINDINGS:   Osseous structures: Normal marrow signal. No fracture or vertebral body height loss. Bilateral pedicle screw fixation at L4-5 with associated hardware R effect.   Alignment: There is severe mid lumbar dextroscoliosis.   Conus medullaris/cauda equina: Normal in appearance.   Paraspinous soft tissues: Unremarkable.   Lower thoracic spine: Unremarkable.   Incidental 10 mm left adrenal nodule is stable from previous and most consistent with an adenoma.   Small bilateral renal cysts.     Levels:   T12-L1: Moderate broad-based posterior disc bulge. There is mild stenosis of the central canal and right foramen which is unchanged.   L1-L2: Moderate broad-based posterior disc bulge. Left-sided facet arthrosis and ligamentum flavum thickening.  Mild stenosis of the central canal. Moderate left and mild right foraminal stenosis without change.   L2-L3: Trace retrolisthesis of L2 on L3. Mild posterior disc bulge, facet arthrosis and ligamentum flavum thickening. Mild stenosis of the central canal. Severe left and mild right foraminal stenosis unchanged.   L3-L4: Small broad-based posterior disc osteophyte bulge. Moderate bilateral foraminal stenosis without significant change. No significant canal stenosis.   L4-L5: Posterior decompression with bilateral pedicle screw fixation with partial fusion across the disc space. No canal stenosis. Moderate to severe right and mild left foraminal stenosis without change.   L5-S1: Transitional vertebra with partial fusion/pseudoarticulation of the right L5 transverse process to the sacrum. Small right foraminal disc osteophyte bulge with mild foraminal stenosis. Unchanged.       IMPRESSION:   1. Multilevel disc degenerative change and facet arthrosis with no acute osseous abnormality. Severe dextroscoliosis.   2.  Previous posterior decompression, pedicle screw fixation and fusion at L4-5 without complication.   3.  The degenerative changes are overall stable in appearance from 2014. There is no high-grade stenosis of the central canal. Multifocal foraminal stenosis greatest on the right at L1-2, left L2-3, bilateral L3-4, right L4-5 stable from previous.   4.  Partial fusion right L5 transverse process to the sacrum.     Lab Results   Component Value Date    CREATININE 1.52 (H) 10/11/2021       Lab Results   Component Value Date    ALKPHOS 86 10/11/2021    BILITOT 0.5 10/11/2021    BILIDIR 0.14 10/11/2021    PROT 6.8 10/11/2021    ALBUMIN 3.9 10/11/2021    ALT 29 10/11/2021    AST 21 10/11/2021       Lab Results   Component Value Date    PLT 250 10/11/2021         Urine toxiciology screen  No results found for: AMPHU, BARBU, BENZU, CANNAU, METHU, OPIAU, COCAU    OPIOID CONFIRMATION:  No results found for: CDIFFTOX, NBUPR, LABCO, HYDROCODONE, HYDROMORPH, MORPHINE, OXYCODONE, OXMU, MAMU, OPIU     BENZODIAZEPINE CONFIRMATION:  No results found for: CDIFFTOX, OHALP, CLONU, HDXYFLRAZUR, 7NHFLU, DESALKYCONF, LORAZURQT, HDXYTRIAZUR, MIDAZURQT, BNZU    Review of Systems:  13 systems reviewed and negative except as mentioned in HPI.     She denies any homicidal or suicidal ideation.         Objective:     PHYSICAL EXAM:  BP 133/66  - Pulse 85  - Temp 36.4 ??C (97.5 ??F) (Skin)  - Resp 16  - Ht 162.6 cm (5' 4)  - Wt 87.5 kg (193 lb)  - SpO2 93%  - BMI 33.13 kg/m??   Wt Readings from Last 3 Encounters:   02/10/22 87.5 kg (193 lb)   10/20/21 86.6 kg (191 lb)   10/22/20 85.2 kg (187 lb 12.8 oz)     GENERAL: Well developed, well-nourished obese female and is in no apparent distress. The patient is pleasant and interactive. Patient is a good historian.  No evidence of sedation or intoxication.   HEENT: Normocephalic/atraumatic.   CARDIOVASCULAR:  RRR, no murmur  RESPIRATORY:  Normal work of breathing, no supplemental 02  EXTREMITIES: No clubbing, cyanosis noted.    GASTROINTESTINAL: Soft, nondistended  NEUROLOGIC:  Alert and oriented, speech fluent, normal language. Cranial nerves grossly intact.  Sensation Intact to light touch throughout the bilateral upper and lower extremities.. Tinel and phalen's test were negative.   MUSCULOSKELETAL:  Motor function  4/5 in right lower extremity but 5/5 in all other extremiteis.  Patient rises from a seated position with moderate difficulty.  The patient was able to ambulate with moderate difficulty throughout the clinic today with the assistance of a walking aid-Wheelchair.     SPINE: The patient does  have pain with palpation of the Lumbar spine.  Negative pain on facet loading procedures. Surgical scaring is noted on spine. Patrick's maneuver Positiveright. Straight leg raising Negative bilateral.   SKIN: No obvious rashes lesions or erythema on the exposed skin  PSYCHIATRIC: Appropriate, full range affect, no psychomotor retardation    Documentation assistance was provided by Waldemar Dickens Scribe, on February 10, 2022 at 12:31 PM for Dr. Marcelle Smiling, MD.      The documentation recorded by the scribe accurately reflects the service I personally performed and the decisions made by me. Betsey Amen, MD          Medical Decision Making    Problems Addressed:  1 or more chronic illness w/ exacerbation, progression, or side effects of treatment (moderate)    Amount/Complexity:  Reviewed external records:  Liver clinic follow up 10/20/21 and [place] PDMP (today on day of visit)  Reviewed results:  Labs 10/11/21-PLT 250, Cr  1.52*  Ordered tests: None  Assessment requiring independent historian: None  Discussion of management/test results with medical professional: None  Independent interpretation of test: None    Risk  Low: OTC meds, minor surgery with no risk factors

## 2022-02-10 NOTE — Unmapped (Addendum)
-  Reach out to insurance or primary care in order to see if home physical therapy would be an option (message [place] on MyChart or call us and we will be happy to write a prescription for physical therapy)  -Continue gabapentin as prescribed by primary care physician  -Start Voltaren gel 1% or generic version called diclofenac gel 1% 4 times daily for 7 to 10 days on right knee, use dosing card provided in the box; this medication can be obtained over-the-counter; instructions provided below  -After participating in physical therapy/home exercise program for some sessions, we may consider pursuing a steroid injection in your hip or back (message [place] on mychart or schedule appointment for clinic visit)  -We prescribed a TENS unit for you today which you can use on areas of the neck and shoulder that feel like a tightening pain for 20-30 minutes up to 5 times per day; you are also able to use that on your thigh, lower back, hip if needed  -Consider purchasing an over-the-counter knee sleeve for swelling around the knee     -I have prescribed Voltaren gel, which is an anti-inflammatory, but in topical form.  It is much safer to use on a daily basis.  If it is approved, you can use it on painful joints up to 4 times per day.  You can use 2 grams (around 1.5 inches) on areas above your belly button (neck, shoulder, elbow, hands, etc) and 4 grams (around 3 inches) on areas below your belly button (hips, knees, ankles, etc).  Max-32 grams in a day.  If it is not approved or too expensive, you do not need to fill it.    It has now gone Over The Counter (OTC)!! This means you can find it at almost all pharmacies.  It is the same strength at the prescription strength.

## 2022-02-17 NOTE — Unmapped (Signed)
Addended by: Carolynne Edouard on: 02/17/2022 12:17 PM     Modules accepted: Level of Service

## 2022-02-21 ENCOUNTER — Ambulatory Visit: Admit: 2022-02-21 | Discharge: 2022-02-22 | Payer: MEDICARE

## 2022-02-21 DIAGNOSIS — Z5181 Encounter for therapeutic drug level monitoring: Secondary | ICD-10-CM | POA: Diagnosis not present

## 2022-02-21 DIAGNOSIS — E612 Magnesium deficiency: Secondary | ICD-10-CM | POA: Diagnosis not present

## 2022-02-21 DIAGNOSIS — Z944 Liver transplant status: Secondary | ICD-10-CM | POA: Diagnosis not present

## 2022-02-21 LAB — CBC W/ AUTO DIFF
BASOPHILS ABSOLUTE COUNT: 0.1 10*9/L (ref 0.0–0.2)
BASOPHILS RELATIVE PERCENT: 0.8 %
EOSINOPHILS ABSOLUTE COUNT: 0.3 10*9/L (ref 0.0–0.4)
EOSINOPHILS RELATIVE PERCENT: 3.4 %
HEMATOCRIT: 37.2 % (ref 34.0–44.0)
HEMOGLOBIN: 11.7 g/dL (ref 11.5–15.0)
LYMPHOCYTES ABSOLUTE COUNT: 2.5 10*9/L (ref 0.7–4.5)
LYMPHOCYTES RELATIVE PERCENT: 28.3 %
MEAN CORPUSCULAR HEMOGLOBIN CONC: 31.5 g/dL — ABNORMAL LOW (ref 32.0–36.0)
MEAN CORPUSCULAR HEMOGLOBIN: 30.9 pg (ref 27.0–34.0)
MEAN CORPUSCULAR VOLUME: 98.2 fL — ABNORMAL HIGH (ref 80.0–98.0)
MEAN PLATELET VOLUME: 10.1 fL (ref 7.4–10.4)
MONOCYTES ABSOLUTE COUNT: 0.8 10*9/L (ref 0.1–1.0)
MONOCYTES RELATIVE PERCENT: 8.7 %
NEUTROPHILS ABSOLUTE COUNT: 5.3 10*9/L (ref 1.8–7.8)
NEUTROPHILS RELATIVE PERCENT: 58.7 %
PLATELET COUNT: 227 10*9/L (ref 140–415)
RED BLOOD CELL COUNT: 3.79 10*12/L — ABNORMAL LOW (ref 3.80–5.10)
RED CELL DISTRIBUTION WIDTH: 13.3 % (ref 11.5–14.5)
WBC ADJUSTED: 9 10*9/L (ref 4.0–10.5)

## 2022-02-21 LAB — COMPREHENSIVE METABOLIC PANEL
ALBUMIN: 3.6 g/dL (ref 3.5–5.0)
ALKALINE PHOSPHATASE: 82 U/L (ref 46–116)
ALT (SGPT): 30 U/L (ref 12–78)
ANION GAP: 7 mmol/L (ref 3–11)
AST (SGOT): 21 U/L (ref 15–40)
BILIRUBIN TOTAL: 0.5 mg/dL (ref 0.3–1.2)
BLOOD UREA NITROGEN: 26 mg/dL — ABNORMAL HIGH (ref 8–20)
BUN / CREAT RATIO: 19
CALCIUM: 9 mg/dL (ref 8.5–10.1)
CHLORIDE: 106 mmol/L (ref 98–107)
CO2: 28.3 mmol/L (ref 21.0–32.0)
CREATININE: 1.36 mg/dL — ABNORMAL HIGH (ref 0.60–1.10)
EGFR CKD-EPI (2021) FEMALE: 39 mL/min/{1.73_m2} — ABNORMAL LOW (ref >=60)
GLUCOSE RANDOM: 106 mg/dL (ref 70–179)
POTASSIUM: 4.7 mmol/L (ref 3.5–5.0)
PROTEIN TOTAL: 6.7 g/dL (ref 6.0–8.0)
SODIUM: 141 mmol/L (ref 135–145)

## 2022-02-21 LAB — MAGNESIUM: MAGNESIUM: 1.7 mg/dL (ref 1.6–2.6)

## 2022-02-21 LAB — PHOSPHORUS: PHOSPHORUS: 3.7 mg/dL (ref 2.4–5.1)

## 2022-02-21 LAB — BILIRUBIN, DIRECT: BILIRUBIN DIRECT: 0.12 mg/dL (ref 0.10–0.30)

## 2022-02-21 LAB — GAMMA GT: GAMMA GLUTAMYL TRANSFERASE: 49 U/L — ABNORMAL HIGH (ref 0–38)

## 2022-02-22 LAB — TACROLIMUS LEVEL, TROUGH: TACROLIMUS, TROUGH: 4.4 ng/mL — ABNORMAL LOW (ref 5.0–15.0)

## 2022-03-01 DIAGNOSIS — J069 Acute upper respiratory infection, unspecified: Secondary | ICD-10-CM | POA: Diagnosis not present

## 2022-03-01 DIAGNOSIS — R059 Cough, unspecified: Secondary | ICD-10-CM | POA: Diagnosis not present

## 2022-03-04 DIAGNOSIS — R5383 Other fatigue: Secondary | ICD-10-CM | POA: Diagnosis not present

## 2022-03-04 DIAGNOSIS — R7303 Prediabetes: Secondary | ICD-10-CM | POA: Diagnosis not present

## 2022-03-04 DIAGNOSIS — N1831 Chronic kidney disease, stage 3a: Secondary | ICD-10-CM | POA: Diagnosis not present

## 2022-03-04 DIAGNOSIS — E559 Vitamin D deficiency, unspecified: Secondary | ICD-10-CM | POA: Diagnosis not present

## 2022-03-04 DIAGNOSIS — E782 Mixed hyperlipidemia: Secondary | ICD-10-CM | POA: Diagnosis not present

## 2022-03-07 NOTE — Unmapped (Signed)
[facility] Refill Coordination Note    Specialty Medication(s) to be Shipped:   Transplant: Prograf 1mg     Other medication(s) to be shipped: No additional medications requested for fill at this time     Tamara Morris, DOB: 1941-02-15  Phone: [phone] (home)       All above HIPAA information was verified with patient.     Was a Nurse, learning disability used for this call? No    Completed refill call assessment today to schedule patient's medication shipment from the [facility] [phone]).  All relevant notes have been reviewed.     Specialty medication(s) and dose(s) confirmed: Regimen is correct and unchanged.   Changes to medications: Tamara Morris reports no changes at this time.  Changes to insurance: No  New side effects reported not previously addressed with a pharmacist or physician: None reported  Questions for the pharmacist: No    Confirmed patient received a Conservation officer, historic buildings and a Surveyor, mining with first shipment. The patient will receive a drug information handout for each medication shipped and additional FDA Medication Guides as required.       DISEASE/MEDICATION-SPECIFIC INFORMATION        N/A    SPECIALTY MEDICATION ADHERENCE     Medication Adherence    Patient reported X missed doses in the last month: 0  Specialty Medication: PROGRAF 1 MG capsule (tacrolimus)  Patient is on additional specialty medications: No  Patient is on more than two specialty medications: No  Any gaps in refill history greater than 2 weeks in the last 3 months: no  Demonstrates understanding of importance of adherence: yes  Informant: patient  Reliability of informant: reliable  Provider-estimated medication adherence level: good  Patient is at risk for Non-Adherence: No  Reasons for non-adherence: no problems identified      Adherence tools used: patient uses a pill box to manage medications      Support network for adherence: family member                    Were doses missed due to medication being on hold? No    PROGRAF 1 MG capsule (tacrolimus)  : 4 days of medicine on hand        REFERRAL TO PHARMACIST     Referral to the pharmacist: Not needed      [facility]     Shipping address confirmed in Epic.     Delivery Scheduled: Yes, Expected medication delivery date: 03/09/22.     Medication will be delivered via UPS to the prescription address in Epic WAM.    Tamara Morris [facility] Specialty Technician

## 2022-03-08 MED FILL — PROGRAF 1 MG CAPSULE: ORAL | 30 days supply | Qty: 60 | Fill #11

## 2022-03-09 DIAGNOSIS — Z944 Liver transplant status: Principal | ICD-10-CM

## 2022-03-09 MED ORDER — TACROLIMUS 1 MG CAPSULE, IMMEDIATE-RELEASE
ORAL_CAPSULE | Freq: Two times a day (BID) | ORAL | 11 refills | 30 days | Status: CP
Start: 2022-03-09 — End: ?

## 2022-03-09 NOTE — Unmapped (Signed)
Pt request for RX Refill

## 2022-03-11 DIAGNOSIS — I6521 Occlusion and stenosis of right carotid artery: Secondary | ICD-10-CM | POA: Diagnosis not present

## 2022-03-11 DIAGNOSIS — I10 Essential (primary) hypertension: Secondary | ICD-10-CM | POA: Diagnosis not present

## 2022-03-11 DIAGNOSIS — M25551 Pain in right hip: Secondary | ICD-10-CM | POA: Diagnosis not present

## 2022-03-11 DIAGNOSIS — E875 Hyperkalemia: Secondary | ICD-10-CM | POA: Diagnosis not present

## 2022-03-11 DIAGNOSIS — G4700 Insomnia, unspecified: Secondary | ICD-10-CM | POA: Diagnosis not present

## 2022-03-11 DIAGNOSIS — K580 Irritable bowel syndrome with diarrhea: Secondary | ICD-10-CM | POA: Diagnosis not present

## 2022-03-11 DIAGNOSIS — E782 Mixed hyperlipidemia: Secondary | ICD-10-CM | POA: Diagnosis not present

## 2022-03-11 DIAGNOSIS — N1831 Chronic kidney disease, stage 3a: Secondary | ICD-10-CM | POA: Diagnosis not present

## 2022-03-11 DIAGNOSIS — M545 Low back pain, unspecified: Secondary | ICD-10-CM | POA: Diagnosis not present

## 2022-03-11 DIAGNOSIS — F411 Generalized anxiety disorder: Secondary | ICD-10-CM | POA: Diagnosis not present

## 2022-03-11 DIAGNOSIS — R7303 Prediabetes: Secondary | ICD-10-CM | POA: Diagnosis not present

## 2022-03-11 DIAGNOSIS — Z944 Liver transplant status: Secondary | ICD-10-CM | POA: Diagnosis not present

## 2022-03-14 ENCOUNTER — Other Ambulatory Visit (HOSPITAL_COMMUNITY): Payer: Self-pay | Admitting: Family Medicine

## 2022-03-14 DIAGNOSIS — M25551 Pain in right hip: Secondary | ICD-10-CM

## 2022-03-19 DIAGNOSIS — Z23 Encounter for immunization: Secondary | ICD-10-CM | POA: Diagnosis not present

## 2022-04-01 ENCOUNTER — Ambulatory Visit (HOSPITAL_COMMUNITY)
Admission: RE | Admit: 2022-04-01 | Discharge: 2022-04-01 | Disposition: A | Discharge status: Home / Self Care | Payer: Medicare Other | Source: Ambulatory Visit | Attending: Family Medicine | Admitting: Family Medicine

## 2022-04-01 DIAGNOSIS — M1611 Unilateral primary osteoarthritis, right hip: Secondary | ICD-10-CM | POA: Diagnosis not present

## 2022-04-01 DIAGNOSIS — M25551 Pain in right hip: Secondary | ICD-10-CM | POA: Diagnosis not present

## 2022-04-05 NOTE — Unmapped (Unsigned)
[facility] Clinical Assessment & Refill Coordination Note    Tamara Morris, DOB: [birth year]  Phone: [phone] (home)     All above HIPAA information was verified with patient.     Was a Nurse, learning disability used for this call? No    Specialty Medication(s):   Transplant: Prograf 1mg      Current Outpatient Medications   Medication Sig Dispense Refill    acetaminophen (TYLENOL) 325 MG tablet Take 500 mg by mouth every six (6) hours as needed for pain.       aspirin (ECOTRIN) 81 MG tablet Take 1 tablet (81 mg total) by mouth daily.      atorvastatin (LIPITOR) 40 MG tablet Take 1 tablet (40 mg total) by mouth daily.      cholecalciferol, vitamin D3 25 mcg, 1,000 units,, 1,000 unit (25 mcg) tablet Take 1 tablet (25 mcg total) by mouth daily.      gabapentin (NEURONTIN) 100 MG capsule Take 1 capsule (100 mg total) by mouth nightly.      hydrALAZINE (APRESOLINE) 25 MG tablet Take 1 tablet (25 mg total) by mouth Three (3) times a day.      loperamide (IMODIUM A-D) 2 mg tablet Take 1 tablet (2 mg total) by mouth. Frequency:PRN   Dosage:2   MG  Instructions:  Note:Dose: 2MG       LORazepam (ATIVAN) 0.5 MG tablet Take 1 tablet (0.5 mg total) by mouth continuous as needed.      losartan (COZAAR) 100 MG tablet Take 1 tablet (100 mg total) by mouth daily.      olmesartan (BENICAR) 20 MG tablet Take 1 tablet (20 mg total) by mouth daily.      simvastatin (ZOCOR) 10 MG tablet Take 10 mg by mouth nightly. (Patient not taking: Reported on 10/20/2021)      tacrolimus (PROGRAF) 1 MG capsule Take 1 capsule (1 mg total) by mouth two (2) times a day. 60 capsule 11    verapamil (CALAN-SR) 240 MG CR tablet        No current facility-administered medications for this visit.        Changes to medications: Tamara Morris reports no changes at this time.    Allergies   Allergen Reactions    Codeine Other (See Comments)    Hydromorphone Other (See Comments)    Meperidine Other (See Comments)       Changes to allergies: No    SPECIALTY MEDICATION ADHERENCE     Prograf 1 mg: ~7 days of medicine on hand       Medication Adherence    Patient reported X missed doses in the last month: 0  Specialty Medication: Prograf  Patient is on additional specialty medications: No  Informant: patient      Adherence tools used: patient uses a pill box to manage medications      Support network for adherence: family member                Specialty medication(s) dose(s) confirmed: Regimen is correct and unchanged.     Are there any concerns with adherence? No    Adherence counseling provided? Not needed    CLINICAL MANAGEMENT AND INTERVENTION      Clinical Benefit Assessment:    Do you feel the medicine is effective or helping your condition? Yes    Clinical Benefit counseling provided? Not needed    Adverse Effects Assessment:    Are you experiencing any side effects? No    Are  you experiencing difficulty administering your medicine? No    Quality of Life Assessment:    Quality of Life    Rheumatology  Oncology  Dermatology  Cystic Fibrosis          How many days over the past month did your tranplant  keep you from your normal activities? For example, brushing your teeth or getting up in the morning. 0    Have you discussed this with your provider? Not needed    Acute Infection Status:    Acute infections noted within Epic:  No active infections  Patient reported infection: None    Therapy Appropriateness:    Is therapy appropriate and patient progressing towards therapeutic goals? Yes, therapy is appropriate and should be continued    DISEASE/MEDICATION-SPECIFIC INFORMATION      N/A    Solid Organ Transplant: Not Applicable    PATIENT SPECIFIC NEEDS     Does the patient have any physical, cognitive, or cultural barriers? No    Is the patient high risk? No    Did the patient require a clinical intervention? No    Does the patient require physician intervention or other additional services (i.e., nutrition, smoking cessation, social work)? No    SOCIAL DETERMINANTS OF HEALTH     At the [facility], we have learned that life circumstances - like trouble affording food, housing, utilities, or transportation can affect the health of many of our patients.   That is why we wanted to ask: are you currently experiencing any life circumstances that are negatively impacting your health and/or quality of life? Patient declined to answer    Social Determinants of Health     Financial Resource Strain: Not on file   Internet Connectivity: Not on file   Food Insecurity: Not on file   Tobacco Use: Medium Risk (02/10/2022)    Patient History     Smoking Tobacco Use: Former     Smokeless Tobacco Use: Never     Passive Exposure: Not on file   Housing/Utilities: Not on file   Alcohol Use: Not on file   Transportation Needs: Not on file   Substance Use: Not on file   Health Literacy: Not on file   Physical Activity: Not on file   Interpersonal Safety: Not on file   Stress: Not on file   Intimate Partner Violence: Not on file   Depression: Not on file   Social Connections: Not on file       Would you be willing to receive help with any of the needs that you have identified today? Not applicable       SHIPPING     Specialty Medication(s) to be Shipped:   Transplant: Prograf 1mg     Other medication(s) to be shipped: No additional medications requested for fill at this time     Changes to insurance: No    Delivery Scheduled: Yes, Expected medication delivery date: 04/08/22.     Medication will be delivered via UPS to the confirmed prescription address in [facility].    The patient will receive a drug information handout for each medication shipped and additional FDA Medication Guides as required.  Verified that patient has previously received a Conservation officer, historic buildings and a Surveyor, mining.    The patient or caregiver noted above participated in the development of this care plan and knows that they can request review of or adjustments to the care plan at any time. All of the patient's questions and  concerns have been addressed.    Arnold Long, PharmD   [facility] Specialty Pharmacist

## 2022-04-07 DIAGNOSIS — Z944 Liver transplant status: Principal | ICD-10-CM

## 2022-04-07 MED ORDER — TACROLIMUS 1 MG CAPSULE, IMMEDIATE-RELEASE
ORAL_CAPSULE | Freq: Two times a day (BID) | ORAL | 11 refills | 30 days | Status: CP
Start: 2022-04-07 — End: ?
  Filled 2022-04-07: qty 60, 30d supply, fill #0

## 2022-04-11 ENCOUNTER — Other Ambulatory Visit: Payer: Self-pay | Admitting: *Deleted

## 2022-04-11 DIAGNOSIS — I6523 Occlusion and stenosis of bilateral carotid arteries: Secondary | ICD-10-CM

## 2022-04-25 ENCOUNTER — Ambulatory Visit: Admit: 2022-04-25 | Discharge: 2022-04-26 | Payer: MEDICARE

## 2022-04-25 DIAGNOSIS — Z944 Liver transplant status: Secondary | ICD-10-CM | POA: Diagnosis not present

## 2022-04-25 DIAGNOSIS — E612 Magnesium deficiency: Secondary | ICD-10-CM | POA: Diagnosis not present

## 2022-04-25 DIAGNOSIS — Z5181 Encounter for therapeutic drug level monitoring: Secondary | ICD-10-CM | POA: Diagnosis not present

## 2022-04-25 DIAGNOSIS — Z4823 Encounter for aftercare following liver transplant: Secondary | ICD-10-CM | POA: Diagnosis not present

## 2022-04-25 LAB — CBC W/ AUTO DIFF
BASOPHILS ABSOLUTE COUNT: 0.1 10*9/L (ref 0.0–0.2)
BASOPHILS RELATIVE PERCENT: 0.6 %
EOSINOPHILS ABSOLUTE COUNT: 0.3 10*9/L (ref 0.0–0.4)
EOSINOPHILS RELATIVE PERCENT: 3.6 %
HEMATOCRIT: 35.9 % (ref 34.0–44.0)
HEMOGLOBIN: 11.5 g/dL (ref 11.5–15.0)
LYMPHOCYTES ABSOLUTE COUNT: 2 10*9/L (ref 0.7–4.5)
LYMPHOCYTES RELATIVE PERCENT: 23.1 %
MEAN CORPUSCULAR HEMOGLOBIN CONC: 32 g/dL (ref 32.0–36.0)
MEAN CORPUSCULAR HEMOGLOBIN: 30.9 pg (ref 27.0–34.0)
MEAN CORPUSCULAR VOLUME: 96.5 fL (ref 80.0–98.0)
MEAN PLATELET VOLUME: 10.4 fL (ref 7.4–10.4)
MONOCYTES ABSOLUTE COUNT: 0.8 10*9/L (ref 0.1–1.0)
MONOCYTES RELATIVE PERCENT: 8.7 %
NEUTROPHILS ABSOLUTE COUNT: 5.5 10*9/L (ref 1.8–7.8)
NEUTROPHILS RELATIVE PERCENT: 63.8 %
PLATELET COUNT: 258 10*9/L (ref 140–415)
RED BLOOD CELL COUNT: 3.72 10*12/L — ABNORMAL LOW (ref 3.80–5.10)
RED CELL DISTRIBUTION WIDTH: 13.2 % (ref 11.5–14.5)
WBC ADJUSTED: 8.6 10*9/L (ref 4.0–10.5)

## 2022-04-25 LAB — COMPREHENSIVE METABOLIC PANEL
ALBUMIN: 3.7 g/dL (ref 3.5–5.0)
ALKALINE PHOSPHATASE: 88 U/L (ref 46–116)
ALT (SGPT): 30 U/L (ref 12–78)
ANION GAP: 9 mmol/L (ref 3–11)
AST (SGOT): 24 U/L (ref 15–40)
BILIRUBIN TOTAL: 0.6 mg/dL (ref 0.3–1.2)
BLOOD UREA NITROGEN: 24 mg/dL — ABNORMAL HIGH (ref 8–20)
BUN / CREAT RATIO: 20
CALCIUM: 9.4 mg/dL (ref 8.5–10.1)
CHLORIDE: 107 mmol/L (ref 98–107)
CO2: 27.4 mmol/L (ref 21.0–32.0)
CREATININE: 1.23 mg/dL — ABNORMAL HIGH (ref 0.60–1.10)
EGFR CKD-EPI (2021) FEMALE: 44 mL/min/{1.73_m2} — ABNORMAL LOW (ref >=60)
GLUCOSE RANDOM: 109 mg/dL (ref 70–179)
POTASSIUM: 4.8 mmol/L (ref 3.5–5.0)
PROTEIN TOTAL: 7 g/dL (ref 6.0–8.0)
SODIUM: 143 mmol/L (ref 135–145)

## 2022-04-25 LAB — GAMMA GT: GAMMA GLUTAMYL TRANSFERASE: 52 U/L — ABNORMAL HIGH (ref 0–38)

## 2022-04-25 LAB — PHOSPHORUS: PHOSPHORUS: 3.6 mg/dL (ref 2.4–5.1)

## 2022-04-25 LAB — BILIRUBIN, DIRECT: BILIRUBIN DIRECT: 0.15 mg/dL (ref 0.10–0.30)

## 2022-04-25 LAB — MAGNESIUM: MAGNESIUM: 1.6 mg/dL (ref 1.6–2.6)

## 2022-04-26 LAB — TACROLIMUS LEVEL, TROUGH: TACROLIMUS, TROUGH: 4.7 ng/mL — ABNORMAL LOW (ref 5.0–15.0)

## 2022-04-28 ENCOUNTER — Ambulatory Visit (HOSPITAL_COMMUNITY)
Admission: RE | Admit: 2022-04-28 | Discharge: 2022-04-28 | Disposition: A | Discharge status: Home / Self Care | Payer: Medicare Other | Source: Ambulatory Visit | Attending: Vascular Surgery | Admitting: Vascular Surgery

## 2022-04-28 ENCOUNTER — Ambulatory Visit (INDEPENDENT_AMBULATORY_CARE_PROVIDER_SITE_OTHER): Payer: Medicare Other | Admitting: Physician Assistant

## 2022-04-28 VITALS — BP 148/84 | HR 90 | Temp 98.0°F | Ht 64.0 in | Wt 180.0 lb

## 2022-04-28 DIAGNOSIS — I6523 Occlusion and stenosis of bilateral carotid arteries: Secondary | ICD-10-CM | POA: Diagnosis not present

## 2022-04-28 NOTE — Progress Notes (Signed)
Office Note     CC:  follow up Requesting Provider:  Celene Squibb, MD  HPI: Linda Barr is a 82 y.o. (06/02/1940) female who presents for surveillance of carotid artery stenosis.  She has been followed regularly with 40 to 59% stenosis of bilateral internal carotid arteries.  She denies any diagnosis of TIA or CVA since last office visit.  She follows regularly with her PCP for management of chronic medical conditions including hyperlipidemia, hypertension.  She is minimally ambulatory and is seen today in a wheelchair.  She states she is able to walk however due to multiple back surgeries and hip osteoarthritis her mobility is very limited.  She received a liver transplantation 30 years ago as a result of a hepatitis infection.  She denies tobacco use.  She takes an aspirin and statin daily.  She is accompanied today by her daughter.   Past Medical History:  Diagnosis Date   Anxiety    Arthritis    Chronic back pain    Essential hypertension    History of ventricular Big[place], followed up with cardiologist ,    Gout    History of kidney stones    Hx of liver transplant ([place])    1994, 1995 at [facility]   Hyperlipidemia    Kidney stones    Palpitations     Past Surgical History:  Procedure Laterality Date   ABDOMINAL HYSTERECTOMY     APPENDECTOMY     BACK SURGERY     x3; plates and screws   CATARACT EXTRACTION W/PHACO Left 06/27/2016   Procedure: CATARACT EXTRACTION PHACO AND INTRAOCULAR LENS PLACEMENT ([place]);  Surgeon: Tonny Branch, MD;  Location: [facility];  Service: Ophthalmology;  Laterality: Left;  CDE: 7.94   CATARACT EXTRACTION W/PHACO Right 07/28/2016   Procedure: CATARACT EXTRACTION PHACO AND INTRAOCULAR LENS PLACEMENT RIGHT EYE CDE=6.33;  Surgeon: Tonny Branch, MD;  Location: [facility];  Service: Ophthalmology;  Laterality: Right;  right   LIVER TRANSPLANT      Social History   Socioeconomic History   Marital status: Widowed    Spouse name: Not on file   Number of children: Not on  file   Years of education: Not on file   Highest education level: Not on file  Occupational History   Not on file  Tobacco Use   Smoking status: Former    Packs/day: 1.00    Years: 12.00    Total pack years: 12.00    Types: Cigarettes    Quit date: 04/29/1990    Years since quitting: 32.0   Smokeless tobacco: Former    Quit date: 04/04/1989  Vaping Use   Vaping Use: Never used  Substance and Sexual Activity   Alcohol use: No   Drug use: No   Sexual activity: Never    Birth control/protection: None  Other Topics Concern   Not on file  Social History Narrative   Not on file   Social Determinants of Health   Financial Resource Strain: Not on file  Food Insecurity: Not on file  Transportation Needs: Not on file  Physical Activity: Not on file  Stress: Not on file  Social Connections: Not on file  Intimate Partner Violence: Not on file    Family History  Problem Relation Age of Onset   Stroke Mother    Stroke Other     Current Outpatient Medications  Medication Sig Dispense Refill   acetaminophen (TYLENOL) 500 MG tablet Take 500 mg by mouth every 6 (six)  hours as needed for mild pain or moderate pain.     aspirin 81 MG tablet Take 81 mg by mouth daily as needed (for palpitations).      hydrALAZINE (APRESOLINE) 25 MG tablet Take 25 mg by mouth 2 (two) times daily.  1   lisinopril (PRINIVIL,ZESTRIL) 20 MG tablet Take 20 mg by mouth daily.     LORazepam (ATIVAN) 0.5 MG tablet Take 0.5 mg by mouth daily as needed.     losartan (COZAAR) 100 MG tablet Take 100 mg by mouth daily.     olmesartan (BENICAR) 20 MG tablet Take 20 mg by mouth daily.  1   simvastatin (ZOCOR) 10 MG tablet TAKE 1 TABLET BY MOUTH EVERY DAY AT NIGHT  0   sodium chloride (OCEAN) 0.65 % SOLN nasal spray Place 1 spray into both nostrils every 4 (four) hours as needed for congestion.     tacrolimus (PROGRAF) 1 MG capsule Take 1 mg by mouth 2 (two) times daily.     verapamil (CALAN-SR) 240 MG CR tablet Take  240 mg by mouth daily.     No current facility-administered medications for this visit.    Allergies  Allergen Reactions   Codeine Other (See Comments)    Hallucinate, vomiting and sweating   Demerol [Meperidine] Other (See Comments)    Hallucinations, vomiting and sweating.    Dilaudid [Hydromorphone Hcl] Other (See Comments)    Hallucinations, sweating and vomiting   Hydromorphone Other (See Comments)   Morphine And Related Other (See Comments)    Hallucinations, vomiting and sweating     REVIEW OF SYSTEMS:   '[X]'$  denotes positive finding, '[ ]'$  denotes negative finding Cardiac  Comments:  Chest pain or chest pressure:    Shortness of breath upon exertion:    Short of breath when lying flat:    Irregular heart rhythm:        Vascular    Pain in calf, thigh, or hip brought on by ambulation:    Pain in feet at night that wakes you up from your sleep:     Blood clot in your veins:    Leg swelling:         Pulmonary    Oxygen at home:    Productive cough:     Wheezing:         Neurologic    Sudden weakness in arms or legs:     Sudden numbness in arms or legs:     Sudden onset of difficulty speaking or slurred speech:    Temporary loss of vision in one eye:     Problems with dizziness:         Gastrointestinal    Blood in stool:     Vomited blood:         Genitourinary    Burning when urinating:     Blood in urine:        Psychiatric    Major depression:         Hematologic    Bleeding problems:    Problems with blood clotting too easily:        Skin    Rashes or ulcers:        Constitutional    Fever or chills:      PHYSICAL EXAMINATION:  Vitals:   04/28/22 0953 04/28/22 0956  BP: (!) 150/83 (!) 148/84  Pulse: 90   Temp: 98 F (36.7 C)   TempSrc: Temporal   SpO2: 93%   Weight:  180 lb (81.6 kg)   Height: '5\' 4"'$  (1.626 m)     General:  WDWN in NAD; vital signs documented above Gait: Not observed HENT: WNL, normocephalic Pulmonary: normal  non-labored breathing , without Rales, rhonchi,  wheezing Cardiac: regular HR Abdomen: soft, NT, no masses Skin: without rashes Vascular Exam/Pulses:  Right Left  Radial 2+ (normal) 2+ (normal)   Extremities: without ischemic changes, without Gangrene , without cellulitis; without open wounds;  Musculoskeletal: no muscle wasting or atrophy  Neurologic: A&O X 3;  CN grossly intact Psychiatric:  The pt has Normal affect.   Non-Invasive Vascular Imaging:   R ICA 40-59% stenosis L ICA 1-39% stenosis    ASSESSMENT/PLAN:: 82 y.o. female here for surveillance of carotid artery stenosis  -Subjectively has not had any neurological events since last office visit -Duplex shows 40 to 59% stenosis of the right ICA and 1 to 39% stenosis of the left ICA; no indication for further workup or revascularization of asymptomatic right ICA stenosis at this time -Continue aspirin and statin daily -Recheck carotid duplex in 1 year   Dagoberto Ligas, PA-C [facility] [phone]  Clinic MD:   Scot Dock

## 2022-05-03 NOTE — Unmapped (Signed)
[facility] Specialty Medication Onboarding    Specialty Medication: Tacrolimus 1mg  capsule  Prior Authorization: Not Required   Financial Assistance: No - copay card or gant not available   Final Copay/Day Supply: $98.03 / 30 days    Insurance Restrictions: None     Notes to Pharmacist: Changing from brand Prograf due to high ($411.51) copay.    The triage team has completed the benefits investigation and has determined that the patient is able to fill this medication at [facility]. Please contact the patient to complete the onboarding or follow up with the prescribing physician as needed.

## 2022-05-04 NOTE — Unmapped (Signed)
[facility] Clinical Assessment & Refill Coordination Note    Tamara Morris, DOB: 19-Nov-1940  Phone: [phone] (home)     All above HIPAA information was verified with patient.     Was a Nurse, learning disability used for this call? No    Specialty Medication(s):   Transplant: Prograf 1mg  and tacrolimus 1mg      [facility] Clinical Intervention    Type of intervention: Narrow therapeutic index drug    Medication involved: prograf/tacrolimus    Problem identified: high copay on brand prograf, clinic ok with switch to generic    Intervention performed:  pt aware of and ok with switch to generic, knows to contact clinic on date of switch as bloodwork may be needed. Knows to NOT take them together, to finish out brand prograf and THEN switch to generic tacrolimus    Follow-up needed: clinic aware, pt will stay on consistent generic mfg at [facility] going forward    Approximate time spent: 15-20 minutes    Clinical evidence used to support intervention: FDA Orange Book    Result of the intervention: Prevention of an adverse drug event    Thad Ranger, PharmD   [facility] Specialty Pharmacist        Current Outpatient Medications   Medication Sig Dispense Refill    acetaminophen (TYLENOL) 325 MG tablet Take 500 mg by mouth every six (6) hours as needed for pain.       aspirin (ECOTRIN) 81 MG tablet Take 1 tablet (81 mg total) by mouth daily.      atorvastatin (LIPITOR) 40 MG tablet Take 1 tablet (40 mg total) by mouth daily.      cholecalciferol, vitamin D3 25 mcg, 1,000 units,, 1,000 unit (25 mcg) tablet Take 1 tablet (25 mcg total) by mouth daily.      gabapentin (NEURONTIN) 100 MG capsule Take 1 capsule (100 mg total) by mouth nightly.      hydrALAZINE (APRESOLINE) 25 MG tablet Take 1 tablet (25 mg total) by mouth Three (3) times a day.      loperamide (IMODIUM A-D) 2 mg tablet Take 1 tablet (2 mg total) by mouth. Frequency:PRN   Dosage:2 MG  Instructions:  Note:Dose: 2MG       LORazepam (ATIVAN) 0.5 MG tablet Take 1 tablet (0.5 mg total) by mouth continuous as needed.      losartan (COZAAR) 100 MG tablet Take 1 tablet (100 mg total) by mouth daily.      olmesartan (BENICAR) 20 MG tablet Take 1 tablet (20 mg total) by mouth daily.      simvastatin (ZOCOR) 10 MG tablet Take 10 mg by mouth nightly. (Patient not taking: Reported on 10/20/2021)      tacrolimus (PROGRAF) 1 MG capsule Take 1 capsule (1 mg total) by mouth two (2) times a day. 60 capsule 11    verapamil (CALAN-SR) 240 MG CR tablet        No current facility-administered medications for this visit.        Changes to medications: Kensey reports no changes at this time.    Allergies   Allergen Reactions    Codeine Other (See Comments)    Hydromorphone Other (See Comments)    Meperidine Other (See Comments)       Changes to allergies: No    SPECIALTY MEDICATION ADHERENCE     Tacrolimus 1mg   : 0 days of medicine on hand will start once prograf  supply exhausted  Prograf 1mg   : 7 days of medicine on hand will exhaust supply and then switch to tacrolimus      Medication Adherence    Patient reported X missed doses in the last month: 0  Specialty Medication: prograf 1mg   Patient is on additional specialty medications: Yes  Additional Specialty Medications: Tacrolimus 1mg   Patient Reported Additional Medication X Missed Doses in the Last Month: 0      Adherence tools used: patient uses a pill box to manage medications      Support network for adherence: family member                Specialty medication(s) dose(s) confirmed: Regimen is correct and unchanged.     Are there any concerns with adherence? No    Adherence counseling provided? Not needed    CLINICAL MANAGEMENT AND INTERVENTION      Clinical Benefit Assessment:    Do you feel the medicine is effective or helping your condition? Yes    Clinical Benefit counseling provided? Not needed    Adverse Effects Assessment:    Are you experiencing any side effects? No    Are you experiencing difficulty administering your medicine? No    Quality of Life Assessment:    Quality of Life    Rheumatology  Oncology  Dermatology  Cystic Fibrosis          How many days over the past month did your transplant  keep you from your normal activities? For example, brushing your teeth or getting up in the morning. 0    Have you discussed this with your provider? Not needed    Acute Infection Status:    Acute infections noted within Epic:  No active infections  Patient reported infection: None    Therapy Appropriateness:    Is therapy appropriate and patient progressing towards therapeutic goals? Yes, therapy is appropriate and should be continued    DISEASE/MEDICATION-SPECIFIC INFORMATION      N/A    Solid Organ Transplant: Not Applicable    PATIENT SPECIFIC NEEDS     Does the patient have any physical, cognitive, or cultural barriers? No    Is the patient high risk? No    Did the patient require a clinical intervention? No    Does the patient require physician intervention or other additional services (i.e., nutrition, smoking cessation, social work)? No    SOCIAL DETERMINANTS OF HEALTH     At the [facility], we have learned that life circumstances - like trouble affording food, housing, utilities, or transportation can affect the health of many of our patients.   That is why we wanted to ask: are you currently experiencing any life circumstances that are negatively impacting your health and/or quality of life? No    Social Determinants of Psychologist, prison and probation services Strain: Not on file   Internet Connectivity: Not on file   Food Insecurity: Not on file   Tobacco Use: Medium Risk (02/10/2022)    Patient History     Smoking Tobacco Use: Former     Smokeless Tobacco Use: Never     Passive Exposure: Not on file   Housing/Utilities: Not on file   Alcohol Use: Not on file   Transportation Needs: Not on file   Substance Use: Not on file   Health Literacy: Not on file   Physical Activity: Not on file   Interpersonal Safety: Not on file   Stress: Not on file  Intimate Partner Violence: Not on file   Depression: Not on file   Social Connections: Not on file       Would you be willing to receive help with any of the needs that you have identified today? Not applicable       SHIPPING     Specialty Medication(s) to be Shipped:   Transplant: tacrolimus 1mg     Other medication(s) to be shipped: No additional medications requested for fill at this time     Changes to insurance: No    Delivery Scheduled: Yes, Expected medication delivery date: 05/06/2022.     Medication will be delivered via UPS to the confirmed prescription address in [facility].  Note added that patient ok with $98.03 copay    The patient will receive a drug information handout for each medication shipped and additional FDA Medication Guides as required.  Verified that patient has previously received a Conservation officer, historic buildings and a Surveyor, mining.    The patient or caregiver noted above participated in the development of this care plan and knows that they can request review of or adjustments to the care plan at any time.      All of the patient's questions and concerns have been addressed.    Thad Ranger, PharmD   [facility] Specialty Pharmacist

## 2022-05-05 MED FILL — TACROLIMUS 1 MG CAPSULE, IMMEDIATE-RELEASE: ORAL | 30 days supply | Qty: 60 | Fill #0

## 2022-05-05 NOTE — Unmapped (Signed)
Pt left VM regarding physical therapy.  I attempted to call pt to get further information.  Reached VM, LMTCB

## 2022-05-24 ENCOUNTER — Other Ambulatory Visit (HOSPITAL_COMMUNITY): Payer: Self-pay | Admitting: Internal Medicine

## 2022-05-24 DIAGNOSIS — Z1231 Encounter for screening mammogram for malignant neoplasm of breast: Secondary | ICD-10-CM

## 2022-05-25 NOTE — Unmapped (Signed)
Patient's daughter called and would like referral for in home PT.       Returned call advised to check with insurnce to see who would be in network. Found a PT place in [place] called [facility]. They offer in offce and mobile PT they need referral from [place] and her PCP for Medicare to cover. Please fax  referral to [phone]. [facility] said they would try to get patient in soon. Patient wil reach out to PCP for the other referral.

## 2022-05-26 NOTE — Unmapped (Signed)
Placed external referral per patient request for PT near where patient lives. Will have office team fax over to desired location.    Phylliss Bob, DO  PGY-5 Pain Management Fellow

## 2022-05-26 NOTE — Unmapped (Signed)
Faxed

## 2022-05-30 NOTE — Unmapped (Signed)
[facility] Refill Coordination Note    Specialty Medication(s) to be Shipped:   Transplant: tacrolimus 1mg     Other medication(s) to be shipped: No additional medications requested for fill at this time     Tamara Morris, DOB: 1940/08/14  Phone: [phone] (home)       All above HIPAA information was verified with patient.     Was a Nurse, learning disability used for this call? No    Completed refill call assessment today to schedule patient's medication shipment from the [facility] [phone]).  All relevant notes have been reviewed.     Specialty medication(s) and dose(s) confirmed: Regimen is correct and unchanged.   Changes to medications: Krisanna reports no changes at this time.  Changes to insurance: No  New side effects reported not previously addressed with a pharmacist or physician: None reported  Questions for the pharmacist: No    Confirmed patient received a Conservation officer, historic buildings and a Surveyor, mining with first shipment. The patient will receive a drug information handout for each medication shipped and additional FDA Medication Guides as required.       DISEASE/MEDICATION-SPECIFIC INFORMATION        N/A    SPECIALTY MEDICATION ADHERENCE     Medication Adherence    Patient reported X missed doses in the last month: 0  Specialty Medication: tacrolimus 1 mg  Patient is on additional specialty medications: No  Patient is on more than two specialty medications: No  Adherence tools used: patient uses a pill box to manage medications  Support network for adherence: family member              Were doses missed due to medication being on hold? No     tacrolimus 1 MG capsule (PROGRAF) : 6 days of medicine on hand        REFERRAL TO PHARMACIST     Referral to the pharmacist: Not needed      [facility]     Shipping address confirmed in Epic.     Delivery Scheduled: Yes, Expected medication delivery date: 06/03/22.     Medication will be delivered via UPS to the prescription address in Epic WAM.    Tamara Morris   [facility] Shared [facility] Specialty Technician

## 2022-06-03 ENCOUNTER — Ambulatory Visit (HOSPITAL_COMMUNITY): Payer: No Typology Code available for payment source

## 2022-06-03 MED FILL — TACROLIMUS 1 MG CAPSULE, IMMEDIATE-RELEASE: ORAL | 30 days supply | Qty: 60 | Fill #1

## 2022-06-07 NOTE — Unmapped (Signed)
Daughter called on behalf of pt, Tamara Morris, re: in home pt. Attempt made to return call. LVM for C/B w/ instruction to call [phone] opt 1.

## 2022-06-13 NOTE — Unmapped (Signed)
Pt daughter, Marylene Land, on behalf of pt cld RN line re: in home PT in home referral. Family member requesting a new referral for in home services. Previous referral not accepted by insurance. Please create a new referral to: [facility] [address] [place] [address]. Fax number [phone].

## 2022-06-14 NOTE — Unmapped (Signed)
Addended by: Buena Irish on: 06/14/2022 12:35 PM     Modules accepted: Orders

## 2022-06-17 ENCOUNTER — Encounter (HOSPITAL_COMMUNITY): Payer: Self-pay

## 2022-06-17 ENCOUNTER — Ambulatory Visit (HOSPITAL_COMMUNITY)
Admission: RE | Admit: 2022-06-17 | Discharge: 2022-06-17 | Disposition: A | Discharge status: Home / Self Care | Payer: Medicare Other | Source: Ambulatory Visit | Attending: Internal Medicine | Admitting: Internal Medicine

## 2022-06-17 DIAGNOSIS — Z1231 Encounter for screening mammogram for malignant neoplasm of breast: Secondary | ICD-10-CM | POA: Diagnosis not present

## 2022-06-18 DIAGNOSIS — Z6833 Body mass index (BMI) 33.0-33.9, adult: Secondary | ICD-10-CM | POA: Diagnosis not present

## 2022-06-18 DIAGNOSIS — I10 Essential (primary) hypertension: Secondary | ICD-10-CM | POA: Diagnosis not present

## 2022-06-18 DIAGNOSIS — G8929 Other chronic pain: Secondary | ICD-10-CM | POA: Diagnosis not present

## 2022-06-18 DIAGNOSIS — M549 Dorsalgia, unspecified: Secondary | ICD-10-CM | POA: Diagnosis not present

## 2022-06-18 DIAGNOSIS — Z7982 Long term (current) use of aspirin: Secondary | ICD-10-CM | POA: Diagnosis not present

## 2022-06-18 DIAGNOSIS — M169 Osteoarthritis of hip, unspecified: Secondary | ICD-10-CM | POA: Diagnosis not present

## 2022-06-18 DIAGNOSIS — F419 Anxiety disorder, unspecified: Secondary | ICD-10-CM | POA: Diagnosis not present

## 2022-06-18 DIAGNOSIS — I6523 Occlusion and stenosis of bilateral carotid arteries: Secondary | ICD-10-CM | POA: Diagnosis not present

## 2022-06-18 DIAGNOSIS — M109 Gout, unspecified: Secondary | ICD-10-CM | POA: Diagnosis not present

## 2022-06-18 DIAGNOSIS — E669 Obesity, unspecified: Secondary | ICD-10-CM | POA: Diagnosis not present

## 2022-06-18 DIAGNOSIS — Z944 Liver transplant status: Secondary | ICD-10-CM | POA: Diagnosis not present

## 2022-06-18 DIAGNOSIS — E785 Hyperlipidemia, unspecified: Secondary | ICD-10-CM | POA: Diagnosis not present

## 2022-06-22 DIAGNOSIS — G8929 Other chronic pain: Secondary | ICD-10-CM | POA: Diagnosis not present

## 2022-06-22 DIAGNOSIS — M109 Gout, unspecified: Secondary | ICD-10-CM | POA: Diagnosis not present

## 2022-06-22 DIAGNOSIS — I10 Essential (primary) hypertension: Secondary | ICD-10-CM | POA: Diagnosis not present

## 2022-06-22 DIAGNOSIS — I6523 Occlusion and stenosis of bilateral carotid arteries: Secondary | ICD-10-CM | POA: Diagnosis not present

## 2022-06-22 DIAGNOSIS — M549 Dorsalgia, unspecified: Secondary | ICD-10-CM | POA: Diagnosis not present

## 2022-06-22 DIAGNOSIS — M169 Osteoarthritis of hip, unspecified: Secondary | ICD-10-CM | POA: Diagnosis not present

## 2022-06-24 DIAGNOSIS — M109 Gout, unspecified: Secondary | ICD-10-CM | POA: Diagnosis not present

## 2022-06-24 DIAGNOSIS — I6523 Occlusion and stenosis of bilateral carotid arteries: Secondary | ICD-10-CM | POA: Diagnosis not present

## 2022-06-24 DIAGNOSIS — M169 Osteoarthritis of hip, unspecified: Secondary | ICD-10-CM | POA: Diagnosis not present

## 2022-06-24 DIAGNOSIS — I10 Essential (primary) hypertension: Secondary | ICD-10-CM | POA: Diagnosis not present

## 2022-06-24 DIAGNOSIS — M549 Dorsalgia, unspecified: Secondary | ICD-10-CM | POA: Diagnosis not present

## 2022-06-24 DIAGNOSIS — G8929 Other chronic pain: Secondary | ICD-10-CM | POA: Diagnosis not present

## 2022-06-27 DIAGNOSIS — M109 Gout, unspecified: Secondary | ICD-10-CM | POA: Diagnosis not present

## 2022-06-27 DIAGNOSIS — M549 Dorsalgia, unspecified: Secondary | ICD-10-CM | POA: Diagnosis not present

## 2022-06-27 DIAGNOSIS — M169 Osteoarthritis of hip, unspecified: Secondary | ICD-10-CM | POA: Diagnosis not present

## 2022-06-27 DIAGNOSIS — I10 Essential (primary) hypertension: Secondary | ICD-10-CM | POA: Diagnosis not present

## 2022-06-27 DIAGNOSIS — I6523 Occlusion and stenosis of bilateral carotid arteries: Secondary | ICD-10-CM | POA: Diagnosis not present

## 2022-06-27 DIAGNOSIS — G8929 Other chronic pain: Secondary | ICD-10-CM | POA: Diagnosis not present

## 2022-06-27 NOTE — Unmapped (Signed)
[facility] Refill Coordination Note    Specialty Medication(s) to be Shipped:   Transplant: tacrolimus 1mg     Other medication(s) to be shipped: No additional medications requested for fill at this time     Tamara Morris, DOB: Aug 26, 1940  Phone: [phone] (home)       All above HIPAA information was verified with patient.     Was a Nurse, learning disability used for this call? No    Completed refill call assessment today to schedule patient's medication shipment from the [facility] [phone]).  All relevant notes have been reviewed.     Specialty medication(s) and dose(s) confirmed: Regimen is correct and unchanged.   Changes to medications: Tamara Morris reports no changes at this time.  Changes to insurance: No  New side effects reported not previously addressed with a pharmacist or physician: None reported  Questions for the pharmacist: No    Confirmed patient received a Conservation officer, historic buildings and a Surveyor, mining with first shipment. The patient will receive a drug information handout for each medication shipped and additional FDA Medication Guides as required.       DISEASE/MEDICATION-SPECIFIC INFORMATION        N/A    SPECIALTY MEDICATION ADHERENCE     Medication Adherence    Patient reported X missed doses in the last month: 0  Specialty Medication: tacrolimus 1 MG capsule (PROGRAF)  Patient is on additional specialty medications: No  Patient is on more than two specialty medications: No  Adherence tools used: patient uses a pill box to manage medications  Support network for adherence: family member              Were doses missed due to medication being on hold? No     tacrolimus 1 MG capsule (PROGRAF) : 7 days of medicine on hand        REFERRAL TO PHARMACIST     Referral to the pharmacist: Not needed      [facility]     Shipping address confirmed in Epic.     Delivery Scheduled: Yes, Expected medication delivery date: 07/01/22.     Medication will be delivered via UPS to the prescription address in Epic WAM.    Tamara Morris   [facility] Shared [facility] Specialty Technician

## 2022-06-29 DIAGNOSIS — M169 Osteoarthritis of hip, unspecified: Secondary | ICD-10-CM | POA: Diagnosis not present

## 2022-06-29 DIAGNOSIS — I10 Essential (primary) hypertension: Secondary | ICD-10-CM | POA: Diagnosis not present

## 2022-06-29 DIAGNOSIS — M549 Dorsalgia, unspecified: Secondary | ICD-10-CM | POA: Diagnosis not present

## 2022-06-29 DIAGNOSIS — M109 Gout, unspecified: Secondary | ICD-10-CM | POA: Diagnosis not present

## 2022-06-29 DIAGNOSIS — G8929 Other chronic pain: Secondary | ICD-10-CM | POA: Diagnosis not present

## 2022-06-29 DIAGNOSIS — I6523 Occlusion and stenosis of bilateral carotid arteries: Secondary | ICD-10-CM | POA: Diagnosis not present

## 2022-06-30 MED FILL — TACROLIMUS 1 MG CAPSULE, IMMEDIATE-RELEASE: ORAL | 30 days supply | Qty: 60 | Fill #2

## 2022-07-04 DIAGNOSIS — M109 Gout, unspecified: Secondary | ICD-10-CM | POA: Diagnosis not present

## 2022-07-04 DIAGNOSIS — I6523 Occlusion and stenosis of bilateral carotid arteries: Secondary | ICD-10-CM | POA: Diagnosis not present

## 2022-07-04 DIAGNOSIS — G8929 Other chronic pain: Secondary | ICD-10-CM | POA: Diagnosis not present

## 2022-07-04 DIAGNOSIS — I10 Essential (primary) hypertension: Secondary | ICD-10-CM | POA: Diagnosis not present

## 2022-07-04 DIAGNOSIS — M169 Osteoarthritis of hip, unspecified: Secondary | ICD-10-CM | POA: Diagnosis not present

## 2022-07-04 DIAGNOSIS — M549 Dorsalgia, unspecified: Secondary | ICD-10-CM | POA: Diagnosis not present

## 2022-07-08 DIAGNOSIS — E559 Vitamin D deficiency, unspecified: Secondary | ICD-10-CM | POA: Diagnosis not present

## 2022-07-08 DIAGNOSIS — N1831 Chronic kidney disease, stage 3a: Secondary | ICD-10-CM | POA: Diagnosis not present

## 2022-07-08 DIAGNOSIS — E782 Mixed hyperlipidemia: Secondary | ICD-10-CM | POA: Diagnosis not present

## 2022-07-08 DIAGNOSIS — R7303 Prediabetes: Secondary | ICD-10-CM | POA: Diagnosis not present

## 2022-07-11 DIAGNOSIS — M7061 Trochanteric bursitis, right hip: Principal | ICD-10-CM

## 2022-07-11 NOTE — Unmapped (Signed)
After failing conservative measures for at least 4 weeks, through the process of shared decision making, we have decided to move forward with the below procedure.    Procedure ordered( Include Side and Specific Level) : Right Greater trochanteric bursa steroid injection  Diagnosis: Trochanteric bursitis  Does the condition cause moderate to severe impairment in function? (Pain scale of 1-3 is considered low and does NOT support moderate or severe pain level): Yes..  Pain ranges from 5-10/10.   Had previously?: Yes.. Last: 01/12/16  If yes, results (% relief, %functional improvement, and duration): Significant improvement. It has been years, so there is no way to objectively say.  HEP (please include frequency and duration of sessions):  Limited due to pain.    Last PT for this indication: 07/04/22-6th visit without improvement  Anticoagulants:  ASA .  Instructions?  Continue  Labs required?: No.  Driver: Yes.  PIV: No.  Risk category (consider age, hx bleeding issues, anticoagulants, liver dx, kidney dx): intermediate  Expected timing: Next available  Length of procedure: 30 minutes  Pertinent physical exam findings: 02/10/22-pain over right GTB  Pertinent imaging findings: N/A  Contrast allergy: No.      Orders Placed This Encounter   Procedures    Bursa Inj/Asp Major Joint (96295)     Shyne Resch  Quad proc or spine center  Next avail  Right GTB under fluoro  Driver yes  PIV no  OK to continue aspirin     Standing Status:   Future     Standing Expiration Date:   07/11/2023

## 2022-07-14 DIAGNOSIS — G8929 Other chronic pain: Secondary | ICD-10-CM | POA: Diagnosis not present

## 2022-07-14 DIAGNOSIS — M169 Osteoarthritis of hip, unspecified: Secondary | ICD-10-CM | POA: Diagnosis not present

## 2022-07-14 DIAGNOSIS — I10 Essential (primary) hypertension: Secondary | ICD-10-CM | POA: Diagnosis not present

## 2022-07-14 DIAGNOSIS — M109 Gout, unspecified: Secondary | ICD-10-CM | POA: Diagnosis not present

## 2022-07-14 DIAGNOSIS — M549 Dorsalgia, unspecified: Secondary | ICD-10-CM | POA: Diagnosis not present

## 2022-07-14 DIAGNOSIS — I6523 Occlusion and stenosis of bilateral carotid arteries: Secondary | ICD-10-CM | POA: Diagnosis not present

## 2022-07-15 DIAGNOSIS — I10 Essential (primary) hypertension: Secondary | ICD-10-CM | POA: Diagnosis not present

## 2022-07-15 DIAGNOSIS — E782 Mixed hyperlipidemia: Secondary | ICD-10-CM | POA: Diagnosis not present

## 2022-07-15 DIAGNOSIS — G4700 Insomnia, unspecified: Secondary | ICD-10-CM | POA: Diagnosis not present

## 2022-07-15 DIAGNOSIS — F411 Generalized anxiety disorder: Secondary | ICD-10-CM | POA: Diagnosis not present

## 2022-07-15 DIAGNOSIS — G8929 Other chronic pain: Secondary | ICD-10-CM | POA: Diagnosis not present

## 2022-07-15 DIAGNOSIS — K580 Irritable bowel syndrome with diarrhea: Secondary | ICD-10-CM | POA: Diagnosis not present

## 2022-07-15 DIAGNOSIS — E1122 Type 2 diabetes mellitus with diabetic chronic kidney disease: Secondary | ICD-10-CM | POA: Diagnosis not present

## 2022-07-15 DIAGNOSIS — N1831 Chronic kidney disease, stage 3a: Secondary | ICD-10-CM | POA: Diagnosis not present

## 2022-07-15 DIAGNOSIS — I129 Hypertensive chronic kidney disease with stage 1 through stage 4 chronic kidney disease, or unspecified chronic kidney disease: Secondary | ICD-10-CM | POA: Diagnosis not present

## 2022-07-15 DIAGNOSIS — M545 Low back pain, unspecified: Secondary | ICD-10-CM | POA: Diagnosis not present

## 2022-07-15 DIAGNOSIS — I6529 Occlusion and stenosis of unspecified carotid artery: Secondary | ICD-10-CM | POA: Diagnosis not present

## 2022-07-15 DIAGNOSIS — E1165 Type 2 diabetes mellitus with hyperglycemia: Secondary | ICD-10-CM | POA: Diagnosis not present

## 2022-07-18 DIAGNOSIS — Z7982 Long term (current) use of aspirin: Secondary | ICD-10-CM | POA: Diagnosis not present

## 2022-07-18 DIAGNOSIS — G8929 Other chronic pain: Secondary | ICD-10-CM | POA: Diagnosis not present

## 2022-07-18 DIAGNOSIS — M549 Dorsalgia, unspecified: Secondary | ICD-10-CM | POA: Diagnosis not present

## 2022-07-18 DIAGNOSIS — I10 Essential (primary) hypertension: Secondary | ICD-10-CM | POA: Diagnosis not present

## 2022-07-18 DIAGNOSIS — Z944 Liver transplant status: Secondary | ICD-10-CM | POA: Diagnosis not present

## 2022-07-18 DIAGNOSIS — Z6833 Body mass index (BMI) 33.0-33.9, adult: Secondary | ICD-10-CM | POA: Diagnosis not present

## 2022-07-18 DIAGNOSIS — E669 Obesity, unspecified: Secondary | ICD-10-CM | POA: Diagnosis not present

## 2022-07-18 DIAGNOSIS — F419 Anxiety disorder, unspecified: Secondary | ICD-10-CM | POA: Diagnosis not present

## 2022-07-18 DIAGNOSIS — E785 Hyperlipidemia, unspecified: Secondary | ICD-10-CM | POA: Diagnosis not present

## 2022-07-18 DIAGNOSIS — M109 Gout, unspecified: Secondary | ICD-10-CM | POA: Diagnosis not present

## 2022-07-18 DIAGNOSIS — I6523 Occlusion and stenosis of bilateral carotid arteries: Secondary | ICD-10-CM | POA: Diagnosis not present

## 2022-07-18 DIAGNOSIS — M169 Osteoarthritis of hip, unspecified: Secondary | ICD-10-CM | POA: Diagnosis not present

## 2022-07-21 DIAGNOSIS — I10 Essential (primary) hypertension: Secondary | ICD-10-CM | POA: Diagnosis not present

## 2022-07-21 DIAGNOSIS — M169 Osteoarthritis of hip, unspecified: Secondary | ICD-10-CM | POA: Diagnosis not present

## 2022-07-21 DIAGNOSIS — M109 Gout, unspecified: Secondary | ICD-10-CM | POA: Diagnosis not present

## 2022-07-21 DIAGNOSIS — M549 Dorsalgia, unspecified: Secondary | ICD-10-CM | POA: Diagnosis not present

## 2022-07-21 DIAGNOSIS — I6523 Occlusion and stenosis of bilateral carotid arteries: Secondary | ICD-10-CM | POA: Diagnosis not present

## 2022-07-21 DIAGNOSIS — G8929 Other chronic pain: Secondary | ICD-10-CM | POA: Diagnosis not present

## 2022-07-25 NOTE — Unmapped (Signed)
[facility] Refill Coordination Note    Specialty Medication(s) to be Shipped:   Transplant: tacrolimus 1mg     Other medication(s) to be shipped: No additional medications requested for fill at this time     Tamara Morris, DOB: [birth year]  Phone: [phone] (home)       All above HIPAA information was verified with patient.     Was a Nurse, learning disability used for this call? No    Completed refill call assessment today to schedule patient's medication shipment from the [facility] [phone]).  All relevant notes have been reviewed.     Specialty medication(s) and dose(s) confirmed: Regimen is correct and unchanged.   Changes to medications: Asija reports no changes at this time.  Changes to insurance: No  New side effects reported not previously addressed with a pharmacist or physician: None reported  Questions for the pharmacist: No    Confirmed patient received a Conservation officer, historic buildings and a Surveyor, mining with first shipment. The patient will receive a drug information handout for each medication shipped and additional FDA Medication Guides as required.       DISEASE/MEDICATION-SPECIFIC INFORMATION        N/A    SPECIALTY MEDICATION ADHERENCE     Medication Adherence    Patient reported X missed doses in the last month: 0  Specialty Medication: tacrolimus 1 MG capsule (PROGRAF)  Patient is on additional specialty medications: No  Patient is on more than two specialty medications: No  Any gaps in refill history greater than 2 weeks in the last 3 months: no  Demonstrates understanding of importance of adherence: yes  Informant: patient  Reliability of informant: reliable  Provider-estimated medication adherence level: good  Patient is at risk for Non-Adherence: No  Reasons for non-adherence: no problems identified  Adherence tools used: patient uses a pill box to manage medications  Support network for adherence: family member  Confirmed plan for next specialty medication refill: delivery by pharmacy  Refills needed for supportive medications: not needed          Refill Coordination    Has the Patients' Contact Information Changed: No  Is the Shipping Address Different: No         Were doses missed due to medication being on hold? No    tacrolimus 1mg : 8 days of medicine on hand       REFERRAL TO PHARMACIST     Referral to the pharmacist: Not needed      [facility]     Shipping address confirmed in Epic.       Delivery Scheduled: Yes, Expected medication delivery date: 04/26.     Medication will be delivered via UPS to the prescription address in Epic WAM.    Tamara Morris   [facility] Specialty Technician

## 2022-07-28 MED FILL — TACROLIMUS 1 MG CAPSULE, IMMEDIATE-RELEASE: ORAL | 30 days supply | Qty: 60 | Fill #3

## 2022-07-29 DIAGNOSIS — I10 Essential (primary) hypertension: Secondary | ICD-10-CM | POA: Diagnosis not present

## 2022-07-29 DIAGNOSIS — M549 Dorsalgia, unspecified: Secondary | ICD-10-CM | POA: Diagnosis not present

## 2022-07-29 DIAGNOSIS — G8929 Other chronic pain: Secondary | ICD-10-CM | POA: Diagnosis not present

## 2022-07-29 DIAGNOSIS — I6523 Occlusion and stenosis of bilateral carotid arteries: Secondary | ICD-10-CM | POA: Diagnosis not present

## 2022-07-29 DIAGNOSIS — M109 Gout, unspecified: Secondary | ICD-10-CM | POA: Diagnosis not present

## 2022-07-29 DIAGNOSIS — M169 Osteoarthritis of hip, unspecified: Secondary | ICD-10-CM | POA: Diagnosis not present

## 2022-08-03 DIAGNOSIS — M109 Gout, unspecified: Secondary | ICD-10-CM | POA: Diagnosis not present

## 2022-08-03 DIAGNOSIS — I10 Essential (primary) hypertension: Secondary | ICD-10-CM | POA: Diagnosis not present

## 2022-08-03 DIAGNOSIS — I6523 Occlusion and stenosis of bilateral carotid arteries: Secondary | ICD-10-CM | POA: Diagnosis not present

## 2022-08-03 DIAGNOSIS — G8929 Other chronic pain: Secondary | ICD-10-CM | POA: Diagnosis not present

## 2022-08-03 DIAGNOSIS — M549 Dorsalgia, unspecified: Secondary | ICD-10-CM | POA: Diagnosis not present

## 2022-08-03 DIAGNOSIS — M169 Osteoarthritis of hip, unspecified: Secondary | ICD-10-CM | POA: Diagnosis not present

## 2022-08-05 ENCOUNTER — Ambulatory Visit: Admit: 2022-08-05 | Discharge: 2022-08-06 | Payer: MEDICARE

## 2022-08-05 DIAGNOSIS — E612 Magnesium deficiency: Secondary | ICD-10-CM | POA: Diagnosis not present

## 2022-08-05 DIAGNOSIS — Z944 Liver transplant status: Secondary | ICD-10-CM | POA: Diagnosis not present

## 2022-08-05 DIAGNOSIS — Z5181 Encounter for therapeutic drug level monitoring: Secondary | ICD-10-CM | POA: Diagnosis not present

## 2022-08-05 LAB — CBC W/ AUTO DIFF
BASOPHILS ABSOLUTE COUNT: 0.1 10*9/L (ref 0.0–0.2)
BASOPHILS RELATIVE PERCENT: 0.8 %
EOSINOPHILS ABSOLUTE COUNT: 0.3 10*9/L (ref 0.0–0.4)
EOSINOPHILS RELATIVE PERCENT: 3.1 %
HEMATOCRIT: 36.2 % (ref 34.0–44.0)
HEMOGLOBIN: 11.8 g/dL (ref 11.5–15.0)
LYMPHOCYTES ABSOLUTE COUNT: 2.5 10*9/L (ref 0.7–4.5)
LYMPHOCYTES RELATIVE PERCENT: 27.1 %
MEAN CORPUSCULAR HEMOGLOBIN CONC: 32.6 g/dL (ref 32.0–36.0)
MEAN CORPUSCULAR HEMOGLOBIN: 30.6 pg (ref 27.0–34.0)
MEAN CORPUSCULAR VOLUME: 94 fL (ref 80.0–98.0)
MEAN PLATELET VOLUME: 9.6 fL (ref 7.4–10.4)
MONOCYTES ABSOLUTE COUNT: 0.8 10*9/L (ref 0.1–1.0)
MONOCYTES RELATIVE PERCENT: 8.3 %
NEUTROPHILS ABSOLUTE COUNT: 5.6 10*9/L (ref 1.8–7.8)
NEUTROPHILS RELATIVE PERCENT: 60.4 %
PLATELET COUNT: 223 10*9/L (ref 140–415)
RED BLOOD CELL COUNT: 3.85 10*12/L (ref 3.80–5.10)
RED CELL DISTRIBUTION WIDTH: 13.5 % (ref 11.5–14.5)
WBC ADJUSTED: 9.3 10*9/L (ref 4.0–10.5)

## 2022-08-05 LAB — COMPREHENSIVE METABOLIC PANEL
ALBUMIN: 3.8 g/dL (ref 3.5–5.0)
ALKALINE PHOSPHATASE: 83 U/L (ref 46–116)
ALT (SGPT): 30 U/L (ref 12–78)
ANION GAP: 11 mmol/L (ref 3–11)
AST (SGOT): 16 U/L (ref 15–40)
BILIRUBIN TOTAL: 0.7 mg/dL (ref 0.3–1.2)
BLOOD UREA NITROGEN: 48 mg/dL — ABNORMAL HIGH (ref 8–20)
BUN / CREAT RATIO: 29
CALCIUM: 9.1 mg/dL (ref 8.5–10.1)
CHLORIDE: 105 mmol/L (ref 98–107)
CO2: 24.2 mmol/L (ref 21.0–32.0)
CREATININE: 1.63 mg/dL — ABNORMAL HIGH (ref 0.60–1.10)
EGFR CKD-EPI (2021) FEMALE: 32 mL/min/{1.73_m2} — ABNORMAL LOW (ref >=60)
GLUCOSE RANDOM: 110 mg/dL (ref 70–179)
POTASSIUM: 4.9 mmol/L (ref 3.5–5.0)
PROTEIN TOTAL: 7.2 g/dL (ref 6.0–8.0)
SODIUM: 140 mmol/L (ref 135–145)

## 2022-08-05 LAB — BILIRUBIN, DIRECT: BILIRUBIN DIRECT: 0.18 mg/dL (ref 0.10–0.30)

## 2022-08-05 LAB — GAMMA GT: GAMMA GLUTAMYL TRANSFERASE: 51 U/L — ABNORMAL HIGH (ref 0–38)

## 2022-08-05 LAB — PHOSPHORUS: PHOSPHORUS: 3.8 mg/dL (ref 2.4–5.1)

## 2022-08-05 LAB — MAGNESIUM: MAGNESIUM: 1.8 mg/dL (ref 1.6–2.6)

## 2022-08-06 LAB — TACROLIMUS LEVEL, TROUGH: TACROLIMUS, TROUGH: 4.5 ng/mL — ABNORMAL LOW (ref 5.0–15.0)

## 2022-08-08 DIAGNOSIS — G8929 Other chronic pain: Secondary | ICD-10-CM | POA: Diagnosis not present

## 2022-08-08 DIAGNOSIS — M169 Osteoarthritis of hip, unspecified: Secondary | ICD-10-CM | POA: Diagnosis not present

## 2022-08-08 DIAGNOSIS — I10 Essential (primary) hypertension: Secondary | ICD-10-CM | POA: Diagnosis not present

## 2022-08-08 DIAGNOSIS — M549 Dorsalgia, unspecified: Secondary | ICD-10-CM | POA: Diagnosis not present

## 2022-08-08 DIAGNOSIS — I6523 Occlusion and stenosis of bilateral carotid arteries: Secondary | ICD-10-CM | POA: Diagnosis not present

## 2022-08-08 DIAGNOSIS — M109 Gout, unspecified: Secondary | ICD-10-CM | POA: Diagnosis not present

## 2022-08-09 NOTE — Unmapped (Signed)
Called pt and  spoke to her daughter. Asked her to remind pt to stay hydrated as her 5/3  creatinine was elevated.

## 2022-08-15 NOTE — Unmapped (Signed)
[facility] Refill Coordination Note    Specialty Medication(s) to be Shipped:   Transplant: tacrolimus 1mg     Other medication(s) to be shipped: No additional medications requested for fill at this time     Tamara Morris, DOB: September 15, 1940  Phone: [phone] (home)       All above HIPAA information was verified with patient.     Was a Nurse, learning disability used for this call? No    Completed refill call assessment today to schedule patient's medication shipment from the [facility] [phone]).  All relevant notes have been reviewed.     Specialty medication(s) and dose(s) confirmed: Regimen is correct and unchanged.   Changes to medications: Tamara Morris reports no changes at this time.  Changes to insurance: No  New side effects reported not previously addressed with a pharmacist or physician: None reported  Questions for the pharmacist: No    Confirmed patient received a Conservation officer, historic buildings and a Surveyor, mining with first shipment. The patient will receive a drug information handout for each medication shipped and additional FDA Medication Guides as required.       DISEASE/MEDICATION-SPECIFIC INFORMATION        N/A    SPECIALTY MEDICATION ADHERENCE     Medication Adherence    Patient reported X missed doses in the last month: 0  Specialty Medication: tacrolimus 1 MG capsule (PROGRAF)  Patient is on additional specialty medications: No  Patient is on more than two specialty medications: No  Adherence tools used: patient uses a pill box to manage medications  Support network for adherence: family member                Were doses missed due to medication being on hold? No    tacrolimus 1mg : 7 days of medicine on hand       REFERRAL TO PHARMACIST     Referral to the pharmacist: Not needed      [facility]     Shipping address confirmed in Epic.       Delivery Scheduled: Yes, Expected medication delivery date: 08/23/22.     Medication will be delivered via UPS to the prescription address in Epic WAM.    Tamara Morris   [facility] Shared [facility] Specialty Technician

## 2022-08-16 DIAGNOSIS — I6523 Occlusion and stenosis of bilateral carotid arteries: Secondary | ICD-10-CM | POA: Diagnosis not present

## 2022-08-16 DIAGNOSIS — G8929 Other chronic pain: Secondary | ICD-10-CM | POA: Diagnosis not present

## 2022-08-16 DIAGNOSIS — M549 Dorsalgia, unspecified: Secondary | ICD-10-CM | POA: Diagnosis not present

## 2022-08-16 DIAGNOSIS — I10 Essential (primary) hypertension: Secondary | ICD-10-CM | POA: Diagnosis not present

## 2022-08-16 DIAGNOSIS — M109 Gout, unspecified: Secondary | ICD-10-CM | POA: Diagnosis not present

## 2022-08-16 DIAGNOSIS — M169 Osteoarthritis of hip, unspecified: Secondary | ICD-10-CM | POA: Diagnosis not present

## 2022-08-17 DIAGNOSIS — M549 Dorsalgia, unspecified: Secondary | ICD-10-CM | POA: Diagnosis not present

## 2022-08-17 DIAGNOSIS — Z6833 Body mass index (BMI) 33.0-33.9, adult: Secondary | ICD-10-CM | POA: Diagnosis not present

## 2022-08-17 DIAGNOSIS — E785 Hyperlipidemia, unspecified: Secondary | ICD-10-CM | POA: Diagnosis not present

## 2022-08-17 DIAGNOSIS — F419 Anxiety disorder, unspecified: Secondary | ICD-10-CM | POA: Diagnosis not present

## 2022-08-17 DIAGNOSIS — I10 Essential (primary) hypertension: Secondary | ICD-10-CM | POA: Diagnosis not present

## 2022-08-17 DIAGNOSIS — I6523 Occlusion and stenosis of bilateral carotid arteries: Secondary | ICD-10-CM | POA: Diagnosis not present

## 2022-08-17 DIAGNOSIS — M109 Gout, unspecified: Secondary | ICD-10-CM | POA: Diagnosis not present

## 2022-08-17 DIAGNOSIS — M1611 Unilateral primary osteoarthritis, right hip: Secondary | ICD-10-CM | POA: Diagnosis not present

## 2022-08-17 DIAGNOSIS — Z7982 Long term (current) use of aspirin: Secondary | ICD-10-CM | POA: Diagnosis not present

## 2022-08-17 DIAGNOSIS — G8929 Other chronic pain: Secondary | ICD-10-CM | POA: Diagnosis not present

## 2022-08-17 DIAGNOSIS — E669 Obesity, unspecified: Secondary | ICD-10-CM | POA: Diagnosis not present

## 2022-08-17 DIAGNOSIS — Z944 Liver transplant status: Secondary | ICD-10-CM | POA: Diagnosis not present

## 2022-08-17 NOTE — Unmapped (Signed)
Pre-procedure call made and RN spoke with patient.    Denies recent infection/antibiotics.   Denies being diabetic.  OK TO CONTINUE ASA 81MG   Denies any electronic implants      Driver necessary    Patient informed to arrive 30 minutes before procedure appointment time.  Patient verbalized understanding to all.

## 2022-08-19 ENCOUNTER — Ambulatory Visit: Admit: 2022-08-19 | Discharge: 2022-08-20 | Payer: MEDICARE | Attending: Anesthesiology | Primary: Anesthesiology

## 2022-08-19 DIAGNOSIS — E785 Hyperlipidemia, unspecified: Secondary | ICD-10-CM | POA: Diagnosis not present

## 2022-08-19 DIAGNOSIS — F419 Anxiety disorder, unspecified: Secondary | ICD-10-CM | POA: Diagnosis not present

## 2022-08-19 DIAGNOSIS — M7061 Trochanteric bursitis, right hip: Secondary | ICD-10-CM | POA: Diagnosis not present

## 2022-08-19 DIAGNOSIS — I10 Essential (primary) hypertension: Secondary | ICD-10-CM | POA: Diagnosis not present

## 2022-08-19 DIAGNOSIS — M549 Dorsalgia, unspecified: Secondary | ICD-10-CM | POA: Diagnosis not present

## 2022-08-19 DIAGNOSIS — I6523 Occlusion and stenosis of bilateral carotid arteries: Secondary | ICD-10-CM | POA: Diagnosis not present

## 2022-08-19 DIAGNOSIS — M109 Gout, unspecified: Secondary | ICD-10-CM | POA: Diagnosis not present

## 2022-08-19 DIAGNOSIS — Z944 Liver transplant status: Secondary | ICD-10-CM | POA: Diagnosis not present

## 2022-08-19 DIAGNOSIS — G8929 Other chronic pain: Secondary | ICD-10-CM | POA: Diagnosis not present

## 2022-08-19 DIAGNOSIS — M1611 Unilateral primary osteoarthritis, right hip: Secondary | ICD-10-CM | POA: Diagnosis not present

## 2022-08-19 DIAGNOSIS — M1990 Unspecified osteoarthritis, unspecified site: Secondary | ICD-10-CM | POA: Diagnosis not present

## 2022-08-19 MED ADMIN — lidocaine (XYLOCAINE) 10 mg/mL (1 %) injection 10 mL: 10 mL | @ 14:00:00 | Stop: 2022-08-19

## 2022-08-19 MED ADMIN — triamcinolone acetonide (KENALOG-40) injection 40 mg: 40 mg | @ 14:00:00 | Stop: 2022-08-19

## 2022-08-19 NOTE — Unmapped (Signed)
AVS is reviewed with patient and she verbalized understanding to all. Family at bedside

## 2022-08-19 NOTE — Unmapped (Signed)
POST PROCEDURE INSTRUCTIONS   You may apply an ice pack 20-30 minutes at a time to the injection site if you experience soreness.     Keep the injection site clean and dry. You make remove the band-aid one day following the procedure.     You may take a shower but AVOID getting in to baths, pools or whirlpools for 48 HOURS AFTER THE PROCEDURE.     Remember that it takes a few days, even up to a week for the steroid medicine to reduce inflammation and pain. If you are diabetic, the steroid medicine may cause your blood sugar levels to increase. Please contact your internist regarding medication adjustments.    ACTIVITY   Refrain from heavy activity for the next 24 to 48 hours. General walking is okay. You may resume your normal activities the day following the procedure.   You may start or resume your individualized exercise program or physical therapy 48 hours after the procedure.   MEDICATIONS   Please note that it is okay to continue other prescribed medications (blood pressure, insulin, water pill, depression/anxiety pill, etc.) as well as other prescribed pain medications such as Neurontin, Lyrical, Celebrex, Ultram, Vicodin, Norco and acetaminophen (Tylenol).   SIDE EFFECTS   Increase in pain during the first 24 to 48 hours.     Trouble sleeping during the first one or two nights after the procedure-this is an effect of the steroid medicine.     Facial flushing (it feels like you have a fever, but you do not) - this is an effect of the steroid medicine.     You might experience:   1. Mild to moderate swelling at the joint.   2. Possible bruising at the injection site.     WHEN TO CALL THE DOCTOR/NURSE   Severe pain, worse or different that the pain you had before the procedure.     Fever or chills.     Redness, or swelling around the injection site.     Call the Pain Management  Procedural Nurses [phone] during normal business hours (7 am-3 pm). If it is AFTER HOURS or during a weekend or holiday, call the hospital operator and ask for the Anesthesia Pain physician on call at [phone].   FOR EMERGENCIES, CALL 911 OR GO TO THE NEAREST HOSPITAL EMERGENCY DEPARTMENT.   ?   TO SCHEDULE APPOINTMENTS OR FOR QUESTIONS RELATED TO MEDICATIONS   Call the Pain Management Clinic at [phone]

## 2022-08-19 NOTE — Unmapped (Signed)
PROCEDURE: right greater trochanteric bursa injection under fluoroscopic guidance.    PREOPERATIVE DIAGNOSIS: Bursitis  POST-PROCEDURE DIAGNOSIS: Same    PROCEDURE PERFORMED BY: Dr. Criss Rosales  Assistant: None    Tamara Morris is a 82 y.o. female with a PMHx significant for anxiety, arthritis, chronic back pain, gout, hyperlipidemia, and Hx of liver transplant. She is being seen at the [facility] for bilateral knee pain. She was previously seen by our clinic in 2017 for multiple pain complaints. Has a long history of injections -of which, the effects of the SI joint injections were short-lived, her RFA to assist with SI joint pain was minimally beneficial; her last intervention was a greater trochanteric bursa injection and this helped the pain in her lateral hip and tightness going down her leg.  Her primary pain generators include advanced right hip osteoarthritis, right knee osteoarthritis, SI joint dysfunction, postlaminectomy pain syndrome.     After failing conservative measures for at least 4 weeks, through the process of shared decision making, we have decided to move forward with the below procedure.     Procedure ordered( Include Side and Specific Level) : Right Greater trochanteric bursa steroid injection  Diagnosis: Trochanteric bursitis  Does the condition cause moderate to severe impairment in function? (Pain scale of 1-3 is considered low and does NOT support ???moderate??? or ???severe??? pain level): Yes..  Pain ranges from 5-10/10.   Had previously?: Yes.. Last: 01/12/16  If yes, results (% relief, %functional improvement, and duration): Significant improvement. It has been years, so there is no way to objectively say.  HEP (please include frequency and duration of sessions):  Limited due to pain.    Last PT for this indication: 07/04/22-6th visit without improvement  Anticoagulants:  ASA .  Instructions?  Continue  Labs required?: No.  Driver: Yes.  PIV: No.  Risk category (consider age, hx bleeding issues, anticoagulants, liver dx, kidney dx): intermediate  Expected timing: Next available  Length of procedure: 30 minutes  Pertinent physical exam findings: 02/10/22-pain over right GTB  Pertinent imaging findings: N/A  Contrast allergy: No.       Informed consent was obtained and potential risks discussed including, but not limited to: bleeding, bruising, severe allergic reaction to components of the injection materials, compression of the spinal cord, infection (superficial, deep, abscess and meningitis), nerve or spinal cord damage, paralysis, inability to place the needle properly, arachnoiditis, the possibility of no benefit (pain relief) derived from the injection, or in rare occasions worsening of pain or denervation neuritis. Questions were answered to the patient's satisfaction and the patient wishes to proceed. Alternative options for treatment have previously been discussed and explored with the patient. The patient is taking antiplatelet (ASA-OK to continue) or anticoagulation medications and does  have a driver today.    PROCEDURE DESCRIPTION:  Once in the procedure room, the patient was laid in the supine position on the fluoroscopic table with pressure points padded. Appropriate monitors were applied and a timeout protocol was performed. A sterile prep with ChloraPrep was performed. The procedure was performed in a hat, mask and with sterile gloves.    The procedure was performed under fluoroscopic guidance.  The anatomic position of the trochanteric bursa was identified and the point of maximum tenderness noted.  After using 1% lidocaine for skin local and deeper tissues, using a 22 gauge 3.5-inch Quincke needle, the target point was approached until contact was made with the bone.  The needle was then retracted slightly. Then  after negative aspiration for heme, 5 mL of solution made of 4cc 1% lidocaine and 1cc kenalog 40mg /cc was injected.  The needle was withdrawn without difficulty and the puncture site was cleansed and covered with a sterile bandage.       The patient did tolerate the procedure well and there were no apparent complications. All injection sites were sterilely dressed. A neurological assessment 15 minutes following the procedure was unchanged. The patient was discharged after an appropriate period of observation and post procedural education was given.     Total fluoroscopic time: 6 seconds  Pre-procedure pain score was 8/10  Post-procedure pain score is 0/10    DISPO:    2-3 months clinic, continue home PT

## 2022-08-22 DIAGNOSIS — M549 Dorsalgia, unspecified: Secondary | ICD-10-CM | POA: Diagnosis not present

## 2022-08-22 DIAGNOSIS — M1611 Unilateral primary osteoarthritis, right hip: Secondary | ICD-10-CM | POA: Diagnosis not present

## 2022-08-22 DIAGNOSIS — I6523 Occlusion and stenosis of bilateral carotid arteries: Secondary | ICD-10-CM | POA: Diagnosis not present

## 2022-08-22 DIAGNOSIS — M109 Gout, unspecified: Secondary | ICD-10-CM | POA: Diagnosis not present

## 2022-08-22 DIAGNOSIS — G8929 Other chronic pain: Secondary | ICD-10-CM | POA: Diagnosis not present

## 2022-08-22 DIAGNOSIS — I10 Essential (primary) hypertension: Secondary | ICD-10-CM | POA: Diagnosis not present

## 2022-08-22 MED FILL — TACROLIMUS 1 MG CAPSULE, IMMEDIATE-RELEASE: ORAL | 30 days supply | Qty: 60 | Fill #4

## 2022-08-31 DIAGNOSIS — M549 Dorsalgia, unspecified: Secondary | ICD-10-CM | POA: Diagnosis not present

## 2022-08-31 DIAGNOSIS — M1611 Unilateral primary osteoarthritis, right hip: Secondary | ICD-10-CM | POA: Diagnosis not present

## 2022-08-31 DIAGNOSIS — G8929 Other chronic pain: Secondary | ICD-10-CM | POA: Diagnosis not present

## 2022-08-31 DIAGNOSIS — I10 Essential (primary) hypertension: Secondary | ICD-10-CM | POA: Diagnosis not present

## 2022-08-31 DIAGNOSIS — M109 Gout, unspecified: Secondary | ICD-10-CM | POA: Diagnosis not present

## 2022-08-31 DIAGNOSIS — I6523 Occlusion and stenosis of bilateral carotid arteries: Secondary | ICD-10-CM | POA: Diagnosis not present

## 2022-09-07 DIAGNOSIS — M109 Gout, unspecified: Secondary | ICD-10-CM | POA: Diagnosis not present

## 2022-09-07 DIAGNOSIS — G8929 Other chronic pain: Secondary | ICD-10-CM | POA: Diagnosis not present

## 2022-09-07 DIAGNOSIS — M1611 Unilateral primary osteoarthritis, right hip: Secondary | ICD-10-CM | POA: Diagnosis not present

## 2022-09-07 DIAGNOSIS — I6523 Occlusion and stenosis of bilateral carotid arteries: Secondary | ICD-10-CM | POA: Diagnosis not present

## 2022-09-07 DIAGNOSIS — I10 Essential (primary) hypertension: Secondary | ICD-10-CM | POA: Diagnosis not present

## 2022-09-07 DIAGNOSIS — M549 Dorsalgia, unspecified: Secondary | ICD-10-CM | POA: Diagnosis not present

## 2022-09-13 DIAGNOSIS — G8929 Other chronic pain: Secondary | ICD-10-CM | POA: Diagnosis not present

## 2022-09-13 DIAGNOSIS — M109 Gout, unspecified: Secondary | ICD-10-CM | POA: Diagnosis not present

## 2022-09-13 DIAGNOSIS — I6523 Occlusion and stenosis of bilateral carotid arteries: Secondary | ICD-10-CM | POA: Diagnosis not present

## 2022-09-13 DIAGNOSIS — I10 Essential (primary) hypertension: Secondary | ICD-10-CM | POA: Diagnosis not present

## 2022-09-13 DIAGNOSIS — M549 Dorsalgia, unspecified: Secondary | ICD-10-CM | POA: Diagnosis not present

## 2022-09-13 DIAGNOSIS — M1611 Unilateral primary osteoarthritis, right hip: Secondary | ICD-10-CM | POA: Diagnosis not present

## 2022-09-16 DIAGNOSIS — M1611 Unilateral primary osteoarthritis, right hip: Secondary | ICD-10-CM | POA: Diagnosis not present

## 2022-09-16 DIAGNOSIS — I10 Essential (primary) hypertension: Secondary | ICD-10-CM | POA: Diagnosis not present

## 2022-09-16 DIAGNOSIS — Z944 Liver transplant status: Secondary | ICD-10-CM | POA: Diagnosis not present

## 2022-09-16 DIAGNOSIS — F419 Anxiety disorder, unspecified: Secondary | ICD-10-CM | POA: Diagnosis not present

## 2022-09-16 DIAGNOSIS — E785 Hyperlipidemia, unspecified: Secondary | ICD-10-CM | POA: Diagnosis not present

## 2022-09-16 DIAGNOSIS — Z6833 Body mass index (BMI) 33.0-33.9, adult: Secondary | ICD-10-CM | POA: Diagnosis not present

## 2022-09-16 DIAGNOSIS — M109 Gout, unspecified: Secondary | ICD-10-CM | POA: Diagnosis not present

## 2022-09-16 DIAGNOSIS — G8929 Other chronic pain: Secondary | ICD-10-CM | POA: Diagnosis not present

## 2022-09-16 DIAGNOSIS — Z7982 Long term (current) use of aspirin: Secondary | ICD-10-CM | POA: Diagnosis not present

## 2022-09-16 DIAGNOSIS — E669 Obesity, unspecified: Secondary | ICD-10-CM | POA: Diagnosis not present

## 2022-09-16 DIAGNOSIS — I6523 Occlusion and stenosis of bilateral carotid arteries: Secondary | ICD-10-CM | POA: Diagnosis not present

## 2022-09-16 DIAGNOSIS — M549 Dorsalgia, unspecified: Secondary | ICD-10-CM | POA: Diagnosis not present

## 2022-09-20 DIAGNOSIS — G8929 Other chronic pain: Secondary | ICD-10-CM | POA: Diagnosis not present

## 2022-09-20 DIAGNOSIS — M109 Gout, unspecified: Secondary | ICD-10-CM | POA: Diagnosis not present

## 2022-09-20 DIAGNOSIS — M1611 Unilateral primary osteoarthritis, right hip: Secondary | ICD-10-CM | POA: Diagnosis not present

## 2022-09-20 DIAGNOSIS — I6523 Occlusion and stenosis of bilateral carotid arteries: Secondary | ICD-10-CM | POA: Diagnosis not present

## 2022-09-20 DIAGNOSIS — M549 Dorsalgia, unspecified: Secondary | ICD-10-CM | POA: Diagnosis not present

## 2022-09-20 DIAGNOSIS — I10 Essential (primary) hypertension: Secondary | ICD-10-CM | POA: Diagnosis not present

## 2022-09-20 NOTE — Unmapped (Signed)
[facility] Refill Coordination Note    Specialty Medication(s) to be Shipped:   Transplant: tacrolimus 1mg     Other medication(s) to be shipped: No additional medications requested for fill at this time     Tamara Morris, DOB: [birth year]  Phone: [phone] (home)       All above HIPAA information was verified with patient.     Was a Nurse, learning disability used for this call? No    Completed refill call assessment today to schedule patient's medication shipment from the [facility] [phone]).  All relevant notes have been reviewed.     Specialty medication(s) and dose(s) confirmed: Regimen is correct and unchanged.   Changes to medications: Tamara Morris reports no changes at this time.  Changes to insurance: No  New side effects reported not previously addressed with a pharmacist or physician: None reported  Questions for the pharmacist: No    Confirmed patient received a Conservation officer, historic buildings and a Surveyor, mining with first shipment. The patient will receive a drug information handout for each medication shipped and additional FDA Medication Guides as required.       DISEASE/MEDICATION-SPECIFIC INFORMATION        N/A    SPECIALTY MEDICATION ADHERENCE     Medication Adherence    Patient reported X missed doses in the last month: 0  Specialty Medication: tacrolimus 1 MG capsule (PROGRAF)  Patient is on additional specialty medications: No  Adherence tools used: patient uses a pill box to manage medications  Support network for adherence: family member              Were doses missed due to medication being on hold? No    tacrolimus 1 mg: 7 days of medicine on hand       REFERRAL TO PHARMACIST     Referral to the pharmacist: Not needed      [facility]     Shipping address confirmed in Epic.       Delivery Scheduled: Yes, Expected medication delivery date: 09/23/22.     Medication will be delivered via UPS to the prescription address in Epic WAM.    Quintella Reichert   [facility] Specialty Technician

## 2022-09-22 MED FILL — TACROLIMUS 1 MG CAPSULE, IMMEDIATE-RELEASE: ORAL | 30 days supply | Qty: 60 | Fill #5

## 2022-09-23 NOTE — Unmapped (Signed)
Received call from pt requesting new standing lab order. Faxed order through [facility] letters.

## 2022-09-30 DIAGNOSIS — M1611 Unilateral primary osteoarthritis, right hip: Secondary | ICD-10-CM | POA: Diagnosis not present

## 2022-09-30 DIAGNOSIS — M549 Dorsalgia, unspecified: Secondary | ICD-10-CM | POA: Diagnosis not present

## 2022-09-30 DIAGNOSIS — M109 Gout, unspecified: Secondary | ICD-10-CM | POA: Diagnosis not present

## 2022-09-30 DIAGNOSIS — G8929 Other chronic pain: Secondary | ICD-10-CM | POA: Diagnosis not present

## 2022-09-30 DIAGNOSIS — I6523 Occlusion and stenosis of bilateral carotid arteries: Secondary | ICD-10-CM | POA: Diagnosis not present

## 2022-09-30 DIAGNOSIS — I10 Essential (primary) hypertension: Secondary | ICD-10-CM | POA: Diagnosis not present

## 2022-10-05 DIAGNOSIS — G8929 Other chronic pain: Secondary | ICD-10-CM | POA: Diagnosis not present

## 2022-10-05 DIAGNOSIS — M549 Dorsalgia, unspecified: Secondary | ICD-10-CM | POA: Diagnosis not present

## 2022-10-05 DIAGNOSIS — M1611 Unilateral primary osteoarthritis, right hip: Secondary | ICD-10-CM | POA: Diagnosis not present

## 2022-10-05 DIAGNOSIS — I6523 Occlusion and stenosis of bilateral carotid arteries: Secondary | ICD-10-CM | POA: Diagnosis not present

## 2022-10-05 DIAGNOSIS — I10 Essential (primary) hypertension: Secondary | ICD-10-CM | POA: Diagnosis not present

## 2022-10-05 DIAGNOSIS — M109 Gout, unspecified: Secondary | ICD-10-CM | POA: Diagnosis not present

## 2022-10-07 NOTE — Unmapped (Signed)
Pt's daughter Tamara Morris lvm that pt needed to r/s 7/10 virtual annual appt bc pt's a/c in car is broken and she will be unable to get her labs today as she had planned. She asked for call back to reschedule.    Daughter called front desk to cancel appt after the lvm on this tpa's phone.    Called Tamara Morris back and pt rescheduled to 8/26 at 2:50 pm. Pt will now get labs on 8/2 and daughter wanted to make sure that was ok since appt not until 8/26. Confirmed with her that it was ok, and she verbalized understanding of all discussed.

## 2022-10-11 NOTE — Unmapped (Signed)
Patient's Daughter, Marylene Land left a message on the nurse line requesting a repeat procedure/injection.   I called patient for further information: Marylene Land states pt has a follow up clinic visit on 8/15, OV is with NP, she states it is a hassle because she has to drive an hour to pick her mother up and then an hour to get to clinic, she is wondering if she can skip the clinic visit and just repeat the procedure, please advise.   Current pain score 7/10  Pain located Right hip  radiates back  Last procedure name and date 08/19/22 right greater trochanteric bursa injection under fluoroscopic guidance.   Percentage of pain relief 50%  Duration of the pain relief  (example 80% x 2 months) 1 month  Requesting same procedure yes  Requesting different procedure no  Taking blood thinners no  Diabetic no  Any ongoing physical therapy, home exercises or chiropractic treatments since last procedure ? Finished in home physical therapy 2 weeks ago   If appropriate, please place order.

## 2022-10-12 DIAGNOSIS — M1611 Unilateral primary osteoarthritis, right hip: Secondary | ICD-10-CM | POA: Diagnosis not present

## 2022-10-12 DIAGNOSIS — M109 Gout, unspecified: Secondary | ICD-10-CM | POA: Diagnosis not present

## 2022-10-12 DIAGNOSIS — I6523 Occlusion and stenosis of bilateral carotid arteries: Secondary | ICD-10-CM | POA: Diagnosis not present

## 2022-10-12 DIAGNOSIS — I10 Essential (primary) hypertension: Secondary | ICD-10-CM | POA: Diagnosis not present

## 2022-10-12 DIAGNOSIS — G8929 Other chronic pain: Secondary | ICD-10-CM | POA: Diagnosis not present

## 2022-10-12 DIAGNOSIS — M549 Dorsalgia, unspecified: Secondary | ICD-10-CM | POA: Diagnosis not present

## 2022-10-14 DIAGNOSIS — M7061 Trochanteric bursitis, right hip: Principal | ICD-10-CM

## 2022-10-14 NOTE — Unmapped (Signed)
fter failing conservative measures for at least 4 weeks, through the process of shared decision making, we have decided to move forward with the below procedure.     Procedure ordered( Include Side and Specific Level) : Right Greater trochanteric bursa steroid injection  Diagnosis: Trochanteric bursitis  Does the condition cause moderate to severe impairment in function? (Pain scale of 1-3 is considered low and does NOT support ???moderate??? or ???severe??? pain level): Yes..  Pain 7/10  Had previously?: Yes.. Last: 08/19/22  If yes, results (% relief, %functional improvement, and duration): Significant improvement. 50% for 1 month that started to wear off.   HEP (please include frequency and duration of sessions):  Limited due to pain.    Last PT for this indication: Finished in home physical therapy 2 weeks ago   Anticoagulants:  ASA .  Instructions?  Continue  Labs required?: No.  Driver: Yes.  PIV: No.  Risk category (consider age, hx bleeding issues, anticoagulants, liver dx, kidney dx): intermediate  Expected timing: 8/17 or later  Length of procedure: 30 minutes  Pertinent physical exam findings: 02/10/22-pain over right GTB  Pertinent imaging findings: N/A  Contrast allergy: No.      Orders Placed This Encounter   Procedures    Bursa Inj/Asp Major Joint (82956)     Beverlee Wilmarth   Quad proc or spine center   8/17 or later  Right GTB under fluoro   Driver yes   PIV no   OK to continue aspirin     Standing Status:   Future     Standing Expiration Date:   10/14/2023

## 2022-10-14 NOTE — Unmapped (Signed)
error 

## 2022-10-31 NOTE — Unmapped (Signed)
[facility] has made a second and final attempt to reach this patient to refill the following medication:tacrolimus 1 MG capsule (PROGRAF).      We have left voicemails on the following phone numbers: [phone], [phone] .    Dates contacted: 07/12 and 07/18  Last scheduled delivery: 09/22/2022    The patient may be at risk of non-compliance with this medication. The patient should call the [facility] at [phone]  Option 4, then Option 4: Infectious Disease, Transplant to refill medication.    Jorje Guild [facility] Specialty Technician

## 2022-11-01 NOTE — Unmapped (Signed)
[facility] Refill Coordination Note    Specialty Medication(s) to be Shipped:   Transplant: tacrolimus 1mg     Other medication(s) to be shipped: No additional medications requested for fill at this time     Tamara Morris, DOB: 12-19-1940  Phone: [phone] (home)       All above HIPAA information was verified with patient.     Was a Nurse, learning disability used for this call? No    Completed refill call assessment today to schedule patient's medication shipment from the [facility] [phone]).  All relevant notes have been reviewed.     Specialty medication(s) and dose(s) confirmed: Regimen is correct and unchanged.   Changes to medications: Oshay reports no changes at this time.  Changes to insurance: No  New side effects reported not previously addressed with a pharmacist or physician: None reported  Questions for the pharmacist: No    Confirmed patient received a Conservation officer, historic buildings and a Surveyor, mining with first shipment. The patient will receive a drug information handout for each medication shipped and additional FDA Medication Guides as required.       DISEASE/MEDICATION-SPECIFIC INFORMATION        N/A    SPECIALTY MEDICATION ADHERENCE     Medication Adherence    Patient reported X missed doses in the last month: 0  Specialty Medication: tacrolimus 1 MG capsule (PROGRAF)  Patient is on additional specialty medications: No  Adherence tools used: patient uses a pill box to manage medications  Support network for adherence: family member              Were doses missed due to medication being on hold? No    tacrolimus 1 mg: 5 days of medicine on hand       REFERRAL TO PHARMACIST     Referral to the pharmacist: Not needed      [facility]     Shipping address confirmed in Epic.       Delivery Scheduled: Yes, Expected medication delivery date: 11/03/22.     Medication will be delivered via UPS to the prescription address in Epic WAM.    Quintella Reichert   [facility] Specialty Technician

## 2022-11-02 MED FILL — TACROLIMUS 1 MG CAPSULE, IMMEDIATE-RELEASE: ORAL | 30 days supply | Qty: 60 | Fill #6

## 2022-11-03 NOTE — Unmapped (Signed)
Received notification from Shared services of 3 failed attempts to reach pt. Called pt and spoke to her daughter. She stated she would discuss with pt and call pharmacy back.

## 2022-11-08 DIAGNOSIS — E1165 Type 2 diabetes mellitus with hyperglycemia: Secondary | ICD-10-CM | POA: Diagnosis not present

## 2022-11-08 DIAGNOSIS — N1831 Chronic kidney disease, stage 3a: Secondary | ICD-10-CM | POA: Diagnosis not present

## 2022-11-08 DIAGNOSIS — E559 Vitamin D deficiency, unspecified: Secondary | ICD-10-CM | POA: Diagnosis not present

## 2022-11-08 DIAGNOSIS — E782 Mixed hyperlipidemia: Secondary | ICD-10-CM | POA: Diagnosis not present

## 2022-11-17 ENCOUNTER — Ambulatory Visit: Admit: 2022-11-17 | Discharge: 2022-11-18 | Payer: MEDICARE

## 2022-11-17 DIAGNOSIS — M25551 Pain in right hip: Secondary | ICD-10-CM | POA: Diagnosis not present

## 2022-11-17 DIAGNOSIS — M25562 Pain in left knee: Secondary | ICD-10-CM | POA: Diagnosis not present

## 2022-11-17 DIAGNOSIS — M1611 Unilateral primary osteoarthritis, right hip: Secondary | ICD-10-CM | POA: Diagnosis not present

## 2022-11-17 NOTE — Unmapped (Signed)
Department of Anesthesiology  [facility]  [address], [address]  [place], [place] [address]  [phone]      1. Primary osteoarthritis of right hip    2. Right hip pain    3. Left knee pain, unspecified chronicity       Assessment and Plan  Attending: [name] is a 82 y.o. female with a PMHx significant for anxiety, arthritis, chronic back pain, gout, hyperlipidemia, and Hx of liver transplant. [facility]. She was previously seen by our clinic in 2017 for multiple pain complaints. Has a long history of injections -of which, the effects of the SI joint injections were short-lived, her RFA to assist with SI joint pain was minimally beneficial; her last intervention was a greater trochanteric bursa injection and this helped the pain in her lateral hip and tightness going down her leg.  Her primary pain generators include advanced right hip osteoarthritis, right knee osteoarthritis, SI joint dysfunction, postlaminectomy pain syndrome.    August 2024  1. Bilateral knee pain (R>L), unspecified chronicity  Stable, not primary compliant on today's visit. Has tried Voltaren gel in the past. Encouraged she try Voltaren gel again. Encourage she avoid NSAIDs and Tylenol given Hx of liver transplant.     2. Low back pain  Not primary compliant on this visit. Was started on gabapentin, though patient has not been taking as prescribed as she is hesitant towards medications.     3. Right hip pain   Patient had bursa injection on 08/19/22 with appreciable benefit but pain has returned. She reports right hip pain with difficulty walking due to pain. Previously discussed repeat GTB injection given benefit in the past assuming she fails PT. She has scheduled appointment for repeat GTB injection. On exam, very tender right GTB. RLE ROM limited d/t pain. She has an order for right bursa inj. Keep scheduled appointment in place.     4. Bilateral hand numbness  Stable, not primary compliant on today's visit     Patient does  appear to be utilizing pain medications appropriately and does  report that the medications do improve patient's quality of life and functionality level.  At today's visit, the patient reports poor analgesia from their current medication regimen without significant adverse effects.    Urine toxicology: None  Urine toxicology screen N/A appropriate.   Last Opioid Change: N/A  Last EKG: N/A  Previous Compliance Issues: None  Naloxone ordered: N/A  Total morphine equivalents:   Benzodiazepine: No.  Pain Psychology: N/A and Consider referral in the future  [facility] DOC: None  [place]CCSRS database was reviewed 11/16/22.      PLAN:  -Encourage HEP  -Keep appt for GTB injection    Future Considerations:  -If patient is having minimal improvement with physical therapy or baseline level of analgesia as not at goal, then we can consider ordering a greater trochanteric bursa injection on the right  -Caution with medications due to CKD and hx Liver Tx    -Return for Next scheduled follow up.    No orders of the defined types were placed in this encounter.    Requested Prescriptions      No prescriptions requested or ordered in this encounter     HPI:  Tamara Morris is seen in consultation at the request of Lobonc, Ammie Ferrier, MD  For evaluation and recommendations regarding Her chronic pain.  Patient was initially seen in November. Since then, she underwent right GTB injection with our clinic on 08/19/22.     Today, patient presents reporting right hip pain. She had bursa injection on 08/19/22 with appreciable benefit but pain has returned. She reports right hip pain with difficulty walking due to pain.       Current Medications:  Gabapentin 100 mg TID- not currently taking   Voltaren gel    The patient states her pain is located right hip and the severity of her pain ranges from 2/10 to 8/10.  Her pain currently is 7/10 and on average is 7/10.  She describes the sensation of her pain as burning, stabbing. Her pain is present all of the time and worst during the day, evenings, middle of the night. The patient???s pain impacts enjoyment of life, general activity, mood, normal work, relationships, sleep, walking, sitting, standing. Her interval history includes None. Her pain is better, and she does not have new pain to discuss today. She is not on blood thinners or anti-coagulants.       Previous interventions include PT, Medications, Epidural Injections, Other Injections, and Nerve Blocks    R GTB CSI 08/19/22    Previous Medication Trials: gabapentin and lidoderm    Allergies as of 11/17/2022 - Reviewed 08/19/2022   Allergen Reaction Noted    Codeine Other (See Comments) 09/07/2012    Hydromorphone Other (See Comments) 09/07/2012    Meperidine Other (See Comments) 09/07/2012      Current Outpatient Medications   Medication Sig Dispense Refill    acetaminophen (TYLENOL) 325 MG tablet Take 500 mg by mouth every six (6) hours as needed for pain.       aspirin (ECOTRIN) 81 MG tablet Take 1 tablet (81 mg total) by mouth daily.      atorvastatin (LIPITOR) 40 MG tablet Take 1 tablet (40 mg total) by mouth daily.      cholecalciferol, vitamin D3 25 mcg, 1,000 units,, 1,000 unit (25 mcg) tablet Take 1 tablet (25 mcg total) by mouth daily.      gabapentin (NEURONTIN) 100 MG capsule Take 1 capsule (100 mg total) by mouth nightly.      hydrALAZINE (APRESOLINE) 25 MG tablet Take 1 tablet (25 mg total) by mouth Three (3) times a day.      loperamide (IMODIUM A-D) 2 mg tablet Take 1 tablet (2 mg total) by mouth. Frequency:PRN   Dosage:2   MG  Instructions:  Note:Dose: 2MG       LORazepam (ATIVAN) 0.5 MG tablet Take 1 tablet (0.5 mg total) by mouth continuous as needed.      losartan (COZAAR) 100 MG tablet Take 1 tablet (100 mg total) by mouth daily.      olmesartan (BENICAR) 20 MG tablet Take 1 tablet (20 mg total) by mouth daily.      simvastatin (ZOCOR) 10 MG tablet Take 10 mg by mouth nightly. (Patient not taking: Reported on 10/20/2021)      tacrolimus (PROGRAF) 1 MG capsule Take 1 capsule (1 mg total) by mouth two (2) times a day. 60 capsule 11    verapamil (CALAN-SR) 240 MG CR tablet        No current facility-administered medications for this visit.       Imaging/Tests:   MRI Lumbar Spine 12/11/15  FINDINGS:   Osseous structures: Normal marrow signal. No fracture or vertebral body height loss. Bilateral pedicle screw fixation at L4-5 with associated hardware R effect.   Alignment: There  is severe mid lumbar dextroscoliosis.   Conus medullaris/cauda equina: Normal in appearance.   Paraspinous soft tissues: Unremarkable.   Lower thoracic spine: Unremarkable.   Incidental 10 mm left adrenal nodule is stable from previous and most consistent with an adenoma.   Small bilateral renal cysts.     Levels:   T12-L1: Moderate broad-based posterior disc bulge. There is mild stenosis of the central canal and right foramen which is unchanged.   L1-L2: Moderate broad-based posterior disc bulge. Left-sided facet arthrosis and ligamentum flavum thickening. Mild stenosis of the central canal. Moderate left and mild right foraminal stenosis without change.   L2-L3: Trace retrolisthesis of L2 on L3. Mild posterior disc bulge, facet arthrosis and ligamentum flavum thickening. Mild stenosis of the central canal. Severe left and mild right foraminal stenosis unchanged.   L3-L4: Small broad-based posterior disc osteophyte bulge. Moderate bilateral foraminal stenosis without significant change. No significant canal stenosis.   L4-L5: Posterior decompression with bilateral pedicle screw fixation with partial fusion across the disc space. No canal stenosis. Moderate to severe right and mild left foraminal stenosis without change.   L5-S1: Transitional vertebra with partial fusion/pseudoarticulation of the right L5 transverse process to the sacrum. Small right foraminal disc osteophyte bulge with mild foraminal stenosis. Unchanged.     IMPRESSION:   1. Multilevel disc degenerative change and facet arthrosis with no acute osseous abnormality. Severe dextroscoliosis.   2.  Previous posterior decompression, pedicle screw fixation and fusion at L4-5 without complication.   3.  The degenerative changes are overall stable in appearance from 2014. There is no high-grade stenosis of the central canal. Multifocal foraminal stenosis greatest on the right at L1-2, left L2-3, bilateral L3-4, right L4-5 stable from previous.   4.  Partial fusion right L5 transverse process to the sacrum.     Lab Results   Component Value Date    CREATININE 1.63 (H) 08/05/2022       Lab Results   Component Value Date    ALKPHOS 83 08/05/2022    BILITOT 0.7 08/05/2022    BILIDIR 0.18 08/05/2022    PROT 7.2 08/05/2022    ALBUMIN 3.8 08/05/2022    ALT 30 08/05/2022    AST 16 08/05/2022       Lab Results   Component Value Date    PLT 223 08/05/2022       Review of Systems:  General none  Cardiovascular none  Gastrointestinal none  Skin none  Endocrine none  Musculoskeletal back pain, muscle aches/weakness  Neurologic numbness/tingling  Psychiatric none  She denies any homicidal or suicidal ideation.    PHYSICAL EXAM:  BP 180/79  - Pulse 73  - Temp 36.2 ??C (97.2 ??F) (Skin)  - Resp 16  - Ht 163.8 cm (5' 4.5)  - Wt 86.8 kg (191 lb 6.4 oz)  - SpO2 99%  - BMI 32.35 kg/m??   Wt Readings from Last 3 Encounters:   11/17/22 86.8 kg (191 lb 6.4 oz)   02/10/22 87.5 kg (193 lb)   10/20/21 86.6 kg (191 lb)     GENERAL:  The patient is well developed, well-nourished, and appears to be in no apparent distress.   HEAD/NECK:    Normocephalic/atraumatic. clear sclera, pupils not pinpoint  CV:  Warm and well perfused   LUNGS:   Normal work of breathing, no supplemental O2  EXTREMITIES:  No clubbing, cyanosis noted.  NEUROLOGIC:    The patient is alert and oriented, speech fluent, normal language.  MUSCULOSKELETAL:    Motor function  preserved. Very tender right GTB. RLE ROM limited d/t pain.   GAIT:  The patient rises from a seated position with no difficulty and ambulates with nonantalgic gait without the assistance of a walking aid.   SKIN:   No obvious rashes, lesions, or erythema.  PSY:   Appropriate affect. No overt pain behaviors. No evidence of psychomotor retardation or agitation, no signs of intoxication.     We are delivering comprehensive, continuous, longitudinal care for this patient with chronic pain.

## 2022-11-17 NOTE — Unmapped (Addendum)
It was good to see you today.    Keep your schedule for your right hip injection

## 2022-11-18 DIAGNOSIS — E1165 Type 2 diabetes mellitus with hyperglycemia: Secondary | ICD-10-CM | POA: Diagnosis not present

## 2022-11-18 DIAGNOSIS — K580 Irritable bowel syndrome with diarrhea: Secondary | ICD-10-CM | POA: Diagnosis not present

## 2022-11-18 DIAGNOSIS — N1831 Chronic kidney disease, stage 3a: Secondary | ICD-10-CM | POA: Diagnosis not present

## 2022-11-18 DIAGNOSIS — F411 Generalized anxiety disorder: Secondary | ICD-10-CM | POA: Diagnosis not present

## 2022-11-18 DIAGNOSIS — Z944 Liver transplant status: Secondary | ICD-10-CM | POA: Diagnosis not present

## 2022-11-18 DIAGNOSIS — I10 Essential (primary) hypertension: Secondary | ICD-10-CM | POA: Diagnosis not present

## 2022-11-18 DIAGNOSIS — M545 Low back pain, unspecified: Secondary | ICD-10-CM | POA: Diagnosis not present

## 2022-11-18 DIAGNOSIS — E782 Mixed hyperlipidemia: Secondary | ICD-10-CM | POA: Diagnosis not present

## 2022-11-18 DIAGNOSIS — G8929 Other chronic pain: Secondary | ICD-10-CM | POA: Diagnosis not present

## 2022-11-18 DIAGNOSIS — G4700 Insomnia, unspecified: Secondary | ICD-10-CM | POA: Diagnosis not present

## 2022-11-18 DIAGNOSIS — I6529 Occlusion and stenosis of unspecified carotid artery: Secondary | ICD-10-CM | POA: Diagnosis not present

## 2022-11-18 DIAGNOSIS — E875 Hyperkalemia: Secondary | ICD-10-CM | POA: Diagnosis not present

## 2022-11-25 NOTE — Unmapped (Signed)
Lvm on phone of pcp's nurse asking for pt's most recent labs to be faxed. Left fax number and this tpa's phone number with any questions.    Successfully faxed letter to pcp/nurse asking for same lab results. Explained that pt has appt on 8/26 with tx provider and thanked them for prompt response.

## 2022-11-25 NOTE — Unmapped (Signed)
Called Tamara Morris to prep for upcoming virtual annual with Vikki Metheny ANP. Tamara Morris denies any n/v/d/f/abd pain. Tamara Morris reports intermittent constipation. Advised Tamara Morris to use OTC miralax when constipated. Tamara Morris denies any illnesses or hospitalizations in the past year. Tamara Morris denies any alcohol/tobacco/recreational drug use. Tamara Morris will schedule appt with dermatologist for skin cancer screening. Informed Tamara Morris that Cecilie Lowers PharmD authorized azelastine use for allergies. Recommended flu and COVID vaccines this fall. Reviewed medication list. Tamara Morris reported getting labs last week through PCP. Sent request to [facility] to call for results. Tamara Morris verbalized understanding of all information discussed.

## 2022-11-28 ENCOUNTER — Telehealth: Admit: 2022-11-28 | Discharge: 2022-11-29 | Payer: MEDICARE

## 2022-11-28 DIAGNOSIS — Z944 Liver transplant status: Secondary | ICD-10-CM | POA: Diagnosis not present

## 2022-11-28 LAB — LIPID PANEL
CHOLESTEROL/HDL RATIO SCREEN: 2.3 (ref 0.0–4.4)
CHOLESTEROL: 137 mg/dL (ref 100–199)
HDL CHOLESTEROL: 59 mg/dL
LDL CHOLESTEROL CALCULATED: 64 mg/dL (ref 0–99)
TRIGLYCERIDES: 70 mg/dL (ref 0–149)
VLDL CHOLESTEROL CAL: 14 mg/dL (ref 5–40)

## 2022-11-28 LAB — ALBUMIN / CREATININE URINE RATIO
ALBUMIN QUANT URINE: 12.7 ug/mL
ALBUMIN/CREATININE RATIO: 21 mg/g (ref 0–29)
CREATININE, URINE: 60.3 mg/dL

## 2022-11-28 LAB — VITAMIN D 25 HYDROXY: VITAMIN D, TOTAL (25OH): 58.6 ng/mL (ref 30.0–100.0)

## 2022-11-28 LAB — COMPREHENSIVE METABOLIC PANEL
ALBUMIN: 4.1 g/dL (ref 3.7–4.7)
ALKALINE PHOSPHATASE: 85 IU/L (ref 44–121)
ALT (SGPT): 18 IU/L (ref 0–32)
AST (SGOT): 22 IU/L (ref 0–40)
BILIRUBIN TOTAL: 0.5 mg/dL (ref 0.0–1.2)
BLOOD UREA NITROGEN: 23 mg/dL (ref 8–27)
BUN / CREAT RATIO: 16 (ref 12–28)
CALCIUM: 9.2 mg/dL (ref 8.7–10.3)
CHLORIDE: 103 mmol/L (ref 96–106)
CO2: 22 mmol/L (ref 20–29)
CREATININE: 1.45 mg/dL — ABNORMAL HIGH (ref 0.57–1.00)
EGFR CKD-EPI AA MALE: 36 mL/min/{1.73_m2} — ABNORMAL LOW
GLOBULIN, TOTAL: 2.1 g/dL (ref 1.5–4.5)
GLUCOSE RANDOM: 95 mg/dL (ref 70–99)
POTASSIUM: 5.1 mmol/L (ref 3.5–5.2)
PROTEIN TOTAL: 6.2 g/dL (ref 6.0–8.5)
SODIUM: 138 mmol/L (ref 134–144)

## 2022-11-28 LAB — CBC W/ DIFFERENTIAL
BASOPHILS ABSOLUTE COUNT: 0.1 10*3/uL (ref 0.0–0.2)
BASOPHILS RELATIVE PERCENT: 1 %
EOSINOPHILS ABSOLUTE COUNT: 0.3 10*3/uL (ref 0.0–0.4)
EOSINOPHILS RELATIVE PERCENT: 4 %
HEMATOCRIT: 35.2 % (ref 34.0–46.6)
HEMOGLOBIN: 11.5 gldL (ref 11.1–15.9)
IMMATURE CELLS: 1
LYMPHOCYTES ABSOLUTE COUNT: 2.1 10*3/uL
LYMPHOCYTES RELATIVE PERCENT: 24 %
MEAN CORPUSCULAR HEMOGLOBIN CONC: 32.7 g/dL (ref 31.5–35.7)
MEAN CORPUSCULAR HEMOGLOBIN: 31.3 pg (ref 26.6–33.0)
MEAN CORPUSCULAR VOLUME: 96 fL (ref 79–97)
MONOCYTES ABSOLUTE COUNT: 0.8 10*3/uL
MONOCYTES RELATIVE PERCENT: 9 %
NEUTROPHILS ABSOLUTE COUNT: 5.3 10*3/uL (ref 1.4–7.0)
NEUTROPHILS RELATIVE PERCENT: 62 %
PLATELET COUNT: 239 10*3/uL (ref 150–450)
RED BLOOD CELL COUNT: 3.67 x10E6/uL — ABNORMAL LOW (ref 3.77–5.28)
WHITE BLOOD CELL COUNT: 8.6 10*3/uL (ref 3.4–10.8)

## 2022-11-28 LAB — HEMOGLOBIN A1C: HEMOGLOBIN A1C: 6.1 % — ABNORMAL HIGH (ref 4.8–5.6)

## 2022-11-28 NOTE — Unmapped (Signed)
Called pt's pcp office. Spoke to [place] who faxed pts most recent labs from 8/6.    Called back and spoke to [place] asking if there were a tac result from 8/6. She said there was not.    Called pt's lab at [facility] and was told the most recent tac result was from 5/3 which is already in system.    Called pt's daughter Marylene Land and told her that lab had not drawn tac and asked if pt could possibly returrn to lab in next couple of weeks to have just the tac drawn. She was unfamiliar with Tac and told her it was IS that pt took. She said she would make sure it gets drawn and verbalized understanding.

## 2022-11-28 NOTE — Unmapped (Signed)
FOLLOW UP ANNUAL LIVER CLINIC NOTE     Patient Name: Tamara Morris  Medical Record Number: [id number]  Date of Service: 11/28/2022      Current complaint: Follow up Annual Liver    Assessment/Plan:     Nicoleanne Jou is a 82 y.o. 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  Recent testing/images stable, immunosuppressive levels were within goal range (~3).  Plan to continueTacrolimus 1mg  BID.    NSAID use, very sparingly - a few times a month at most for uncontrollable back pain. Recommended more hydration on days she uses these; look into a TENS unit for pain relief. Encouraged her to continue close follow up with local pain clinic.    HEALTH MAINTENANCE:   - Dermatology: This patient is at increased risk for the development of skin cancers due to immunosuppressant medications. We recommend yearly surveillance.  - Mammogram: annual   - Dental: annual  - Mental health: no issues  Immunization History   Administered Date(s) Administered    COVID-19 VAC,BIVALENT,MODERNA(BLUE CAP) 02/06/2021    COVID-19 VACCINE,MRNA(MODERNA)(PF) 05/29/2019, 06/26/2019, 11/22/2019, 05/16/2020, 09/18/2020    Covid-19 Vac, (17yr+) (Spikevax) Monovalent Xbb.1.5 Moder  03/19/2022    Pneumococcal Conjugate 20-valent 10/20/2021    SHINGRIX-ZOSTER VACCINE (HZV),RECOMBINANT,ADJUVANTED(IM) 11/25/2016    TdaP 01/27/2015       Will receive flu and COVID shot this fall.     Return to clinic: 1 year  Labs: monthly      Subjective:     HPI: Tamara Morris is a 82 y.o. female who underwent liver transplant on 03/06/1993 for cryptogenic cirrhosis c/b graft nonfunction s/p second transplant 03/08/1993 who presents for annual evaluation. She has had minimal changes to health since previous visit.     She was diagnosed with moderate r carotid stenosis with less than 50% l carotid stenosis after assessed to have a carotid bruit. She is asymptomatic and on ASA and simvastatin with good control. Denies chest pain, SOB, N/V or abdominal pain. She states she has IBS and frequent stooling that does not cause her any problems. She denies acute complaint today, but does report persistent chronic back pain, largely unchanged.     PMH and PSH reviewed     Social History     Socioeconomic History    Marital status: Divorced   Tobacco Use    Smoking status: Former     Current packs/day: 0.00     Types: Cigarettes     Quit date: 11/02/1988     Years since quitting: 34.0    Smokeless tobacco: Never       REVIEW OF SYSTEMS:   The balance of 10/12 systems is negative with the exception of HPI.    Objective:     MEDICATIONS:  Allergies   Allergen Reactions    Codeine Other (See Comments)    Hydromorphone Other (See Comments)    Meperidine Other (See Comments)       Current Outpatient Medications   Medication Sig Dispense Refill    acetaminophen (TYLENOL) 325 MG tablet Take 500 mg by mouth every six (6) hours as needed for pain.       aspirin (ECOTRIN) 81 MG tablet Take 1 tablet (81 mg total) by mouth daily.      atorvastatin (LIPITOR) 40 MG tablet Take 1 tablet (40 mg total) by mouth daily.      cholecalciferol, vitamin D3 25 mcg, 1,000 units,, 1,000 unit (25  mcg) tablet Take 1 tablet (25 mcg total) by mouth daily.      hydrALAZINE (APRESOLINE) 25 MG tablet Take 1 tablet (25 mg total) by mouth Three (3) times a day.      LORazepam (ATIVAN) 0.5 MG tablet Take 1 tablet (0.5 mg total) by mouth continuous as needed.      losartan (COZAAR) 100 MG tablet Take 1 tablet (100 mg total) by mouth daily.      tacrolimus (PROGRAF) 1 MG capsule Take 1 capsule (1 mg total) by mouth two (2) times a day. 60 capsule 11    verapamil (CALAN-SR) 240 MG CR tablet        No current facility-administered medications for this visit.         PHYSICAL EXAM:  VIdeo Visit         General Appearance:  NAD, well appearing and well nourished.                                           LAB RESULTS:  Results for orders placed or performed in visit on 11/08/22   CBC w/ Differential   Result Value Ref Range    WBC 8.6 3.4 - 10.8 x10E3/uL    RBC 3.67 (L) 3.77 - 5.28 x10E6/uL    HGB 11.5 11.1 - 15.9 gldL    HCT 35.2 34.0 - 46.6 %    MCV 96 79 - 97 fL    MCH 31.3 26.6 - 33.0 pg    MCHC 32.7 31.5 - 35.7 g/dL    Platelet 161 096 - 045 x10E3/uL    Neutrophils % 62 %    Lymphocytes % 24 %    Monocytes % 9 %    Eosinophils % 4 %    Basophils % 1 %    Immature Cells 1     Absolute Neutrophils 5.3 1.4 - 7.0 x10E3/uL    Absolute Lymphocytes 2.1 Normal x10E3/uL    Absolute Monocytes 0.8 Normal x10E3/uL    Absolute Eosinophils 0.3 0.0 - 0.4 x10E3/uL    Absolute Basophils 0.1 0.0 - 0.2 x10E3/uL     *Note: Due to a large number of results and/or encounters for the requested time period, some results have not been displayed. A complete set of results can be found in Results Review.             IMAGING:  Xr Chest 2 Views  Result Date: 10/20/2017   Radiographically clear lungs. No pleural effusion or pneumothorax. Unremarkable cardiomediastinal silhouette.     US Liver Transplant  Result Date: 10/20/2017  - Patent hepatic transplant vasculature, with normal flow at the main portal vein anastomosis.   - Mildly elevated resistive indices in the hepatic transplant arteries, similar to prior.      _______________________________________________  Vallery Sa, NP-C  [facility]           The patient reports they are physically located in [place] and is currently: at home. I conducted a audio/video visit. I spent  69m 38s on the video call with the patient. I spent an additional 15 minutes on pre- and post-visit activities on the date of service .

## 2022-11-29 NOTE — Unmapped (Addendum)
Spoke to patient about changing their accord tacrolimus to Sandoz.  Told the patient to finish the old mfg then start the new one.  Told patient to give the clinic a call to schedule labs when they start the new sandoz brand. Pt acknowledged understanding. dcr        [facility] Refill Coordination Note    Specialty Medication(s) to be Shipped:   Transplant: tacrolimus 1mg     Other medication(s) to be shipped: No additional medications requested for fill at this time     Tamara Morris, DOB: [birth year]  Phone: [phone] (home)       All above HIPAA information was verified with patient.     Was a Nurse, learning disability used for this call? No    Completed refill call assessment today to schedule patient's medication shipment from the [facility] [phone]).  All relevant notes have been reviewed.     Specialty medication(s) and dose(s) confirmed: Regimen is correct and unchanged.   Changes to medications: Nozomi reports no changes at this time.  Changes to insurance: No  New side effects reported not previously addressed with a pharmacist or physician: None reported  Questions for the pharmacist: No    Confirmed patient received a Conservation officer, historic buildings and a Surveyor, mining with first shipment. The patient will receive a drug information handout for each medication shipped and additional FDA Medication Guides as required.       DISEASE/MEDICATION-SPECIFIC INFORMATION        N/A    SPECIALTY MEDICATION ADHERENCE     Medication Adherence    Patient reported X missed doses in the last month: 0  Specialty Medication: tacrolimus 1mg   Patient is on additional specialty medications: No  Adherence tools used: patient uses a pill box to manage medications  Support network for adherence: family member              Were doses missed due to medication being on hold? No    tacrolimus 1 mg: 5 days of medicine on hand       REFERRAL TO PHARMACIST     Referral to the pharmacist: Not needed      [facility]     Shipping address confirmed in Epic.       Delivery Scheduled: Yes, Expected medication delivery date: 12/02/22.     Medication will be delivered via UPS to the prescription address in Epic WAM.    Quintella Reichert   [facility] Specialty Technician

## 2022-11-29 NOTE — Unmapped (Signed)
[facility] Clinical Intervention    Type of intervention: Narrow therapeutic index drug    Medication involved: tacrolimus    Problem identified: Pt has been taking accord tacrolimus and we only stock sandoz.    Intervention performed: Spoke to patient about changing their accord tacrolimus to [employer].  Told the patient to finish the old mfg then start the new one.  Told patient to give the clinic a call to schedule labs when they start the new sandoz brand. Pt acknowledged understanding.      Follow-up needed: Pt will call clinic to schedule labs when starting new mfg.    Approximate time spent: 5-10 minutes    Clinical evidence used to support intervention: FDA Orange Book    Result of the intervention: Prevention of an adverse drug event    Tera Helper, [facility]   [facility] Specialty Pharmacist

## 2022-11-29 NOTE — Unmapped (Addendum)
Pre-procedure call. Denies recent infection/antibiotics. Continues to take aspirin 81 mg. Denies taking any recent NSAIDs.        11/29/22 1137   Pre-op Phone Call   Surgery Time Verified Yes   Expected Arrival Time 0745   Arrival Time Verified Yes   Surgery Location Verified Yes   Ride and Caregiver Arranged Yes

## 2022-11-30 NOTE — Unmapped (Signed)
Received call from pt emotionally upset over switch in tac manufacturers. Because pt was advised to get labs after starting on new manufacturer she thought it was because pharmacist knew it was a bad medication for her. Provided reassurance to pt and explained that sometimes there can be a difference in tac levels and labs would be so dose can be adjusted if needed. Pt expressed relief and verbalized understanding. Pt will get labs 2 weeks after starting new med.

## 2022-11-30 NOTE — Unmapped (Signed)
11/28/22 @ 319 pm    Received voicemail from pt's daughter, Tamara Morris, following up a call she had received from [name] about needing a Tacro level drawn     Ms. Tamara Morris stated that she contacted Dr. Carlena Hurl office (PCP) and they need the Tacro lab order sent to their office    Will route this message to pt's TNC ([facility])    Lanora Manis RN

## 2022-12-01 ENCOUNTER — Ambulatory Visit: Admit: 2022-12-01 | Discharge: 2022-12-01 | Payer: MEDICARE | Attending: Anesthesiology | Primary: Anesthesiology

## 2022-12-01 ENCOUNTER — Ambulatory Visit: Admit: 2022-12-01 | Discharge: 2022-12-01 | Payer: MEDICARE

## 2022-12-01 DIAGNOSIS — M7061 Trochanteric bursitis, right hip: Secondary | ICD-10-CM | POA: Diagnosis not present

## 2022-12-01 DIAGNOSIS — M25551 Pain in right hip: Secondary | ICD-10-CM | POA: Diagnosis not present

## 2022-12-01 DIAGNOSIS — M25559 Pain in unspecified hip: Principal | ICD-10-CM

## 2022-12-01 MED ADMIN — lidocaine (PF) (XYLOCAINE-MPF) 10 mg/mL (1 %) injection: @ 12:00:00 | Stop: 2022-12-01

## 2022-12-01 MED ADMIN — triamcinolone acetonide (KENALOG-40) injection: INTRA_ARTICULAR | @ 12:00:00 | Stop: 2022-12-01

## 2022-12-01 MED FILL — TACROLIMUS 1 MG CAPSULE, IMMEDIATE-RELEASE: ORAL | 30 days supply | Qty: 60 | Fill #7

## 2022-12-01 NOTE — Unmapped (Signed)
PROCEDURE: right greater trochanteric bursa injection under fluoroscopic guidance.    PREOPERATIVE DIAGNOSIS: Bursitis  POST-PROCEDURE DIAGNOSIS: Same    PROCEDURE PERFORMED BY: Dr. Criss Rosales  Assistant: None    Tamara Morris is a 82 y.o. female with a PMHx significant for anxiety, arthritis, chronic back pain, gout, hyperlipidemia, and Hx of liver transplant. [facility]. She was previously seen by our clinic in 2017 for multiple pain complaints. Has a long history of injections -of which, the effects of the SI joint injections were short-lived, her RFA to assist with SI joint pain was minimally beneficial; her last intervention was a greater trochanteric bursa injection and this helped the pain in her lateral hip and tightness going down her leg.  Her primary pain generators include advanced right hip osteoarthritis, right knee osteoarthritis, SI joint dysfunction, postlaminectomy pain syndrome.     After failing conservative measures for at least 4 weeks, through the process of shared decision making, we have decided to move forward with the below procedure.   Procedure ordered( Include Side and Specific Level) : Right Greater trochanteric bursa steroid injection  Diagnosis: Trochanteric bursitis  Does the condition cause moderate to severe impairment in function? (Pain scale of 1-3 is considered low and does NOT support ???moderate??? or ???severe??? pain level): Yes..  Pain 7/10  Had previously?: Yes.. Last: 08/19/22  If yes, results (% relief, %functional improvement, and duration): Significant improvement. 50% for 1 month that started to wear off.    On todays visit, she notes that around 3 weeks ago she noticed it started to wear off more, so she realized it really was helping a lot for around 3 months.  HEP (please include frequency and duration of sessions):  Limited due to pain.    Last PT for this indication: Finished in home physical therapy 2 weeks ago   Anticoagulants:  ASA .  Instructions?  Continue  Labs required?: No.  Driver: Yes.  PIV: No.  Risk category (consider age, hx bleeding issues, anticoagulants, liver dx, kidney dx): intermediate  Expected timing: 8/17 or later  Length of procedure: 30 minutes  Pertinent physical exam findings: 02/10/22-pain over right GTB  Pertinent imaging findings: N/A  Contrast allergy: No.    Informed consent was obtained and potential risks discussed including, but not limited to: bleeding, bruising, severe allergic reaction to components of the injection materials, compression of the spinal cord, infection (superficial, deep, abscess and meningitis), nerve or spinal cord damage, paralysis, inability to place the needle properly, arachnoiditis, the possibility of no benefit (pain relief) derived from the injection, or in rare occasions worsening of pain or denervation neuritis. Questions were answered to the patient's satisfaction and the patient wishes to proceed. Alternative options for treatment have previously been discussed and explored with the patient. The patient is taking antiplatelet (ASA-OK to continue) or anticoagulation medications and does  have a driver today.    PROCEDURE DESCRIPTION:  Once in the procedure room, the patient was laid in the supine position on the fluoroscopic table with pressure points padded. Appropriate monitors were applied and a timeout protocol was performed. A sterile prep with ChloraPrep was performed. The procedure was performed in a hat, mask and with sterile gloves.    The procedure was performed under fluoroscopic guidance.  The anatomic position of the trochanteric bursa was identified and the point of maximum tenderness noted.  After using 1% lidocaine for skin local and  deeper tissues, using a 22 gauge 3.5-inch Quincke needle, the target point was approached until contact was made with the bone.  The needle was then retracted slightly. Then after negative aspiration for heme, 5 mL of solution made of 4cc 1% lidocaine and 1cc kenalog 40mg /cc was injected.  The needle was withdrawn without difficulty and the puncture site was cleansed and covered with a sterile bandage.       The patient did tolerate the procedure well and there were no apparent complications. All injection sites were sterilely dressed. A neurological assessment 15 minutes following the procedure was unchanged. The patient was discharged after an appropriate period of observation and post procedural education was given.     Total fluoroscopic time: 6 seconds  Pre-procedure pain score was 8/10  Post-procedure pain score is 0/10    DISPO:  Soonest repeat 03/03/23

## 2022-12-08 DIAGNOSIS — Z23 Encounter for immunization: Secondary | ICD-10-CM | POA: Diagnosis not present

## 2022-12-21 NOTE — Unmapped (Signed)
Pt called and said that she wants to get labs at her usual lab [facility] instead of at pcp's office bc pcp's office could not guarantee it woul. d be covered by insurance due to time between draws. She said insurance always paid for [facility]  labs and asked that updated order be sent since she wants to go in 2 days. Told her this tpa would call [facility] lab and find out status of order and call her back.    Called lab and spoke to [facility] who asked that a new order be sent. Told her fax number in our system and she asked that it be sent to another fax number now and going forward - [phone]. She also asked that order be faxed to her attention. Told her it would be sent this afternoon.    Called pt back and told her the above plan and that this tpa would call tomorrow to verify receipt and let her know when it's confirmed. She was appreciative and asked that this tpa lvm if needed and she verbalized understanding of all discussed.    Updated standing order faxed successfully from this end to [facility] R lab.    [place] [facility] lab fax number updated in Epic.

## 2022-12-22 NOTE — Unmapped (Signed)
Called [facility] lab and confirmed they received updated standing order for pt faxed yesterday.    Called pt and told her that news as she wants to go tomorrow. She was appreciative of assistance and verbalized understanding of all discussed.

## 2022-12-23 ENCOUNTER — Ambulatory Visit: Admit: 2022-12-23 | Discharge: 2022-12-24 | Payer: MEDICARE

## 2022-12-23 DIAGNOSIS — Z79899 Other long term (current) drug therapy: Secondary | ICD-10-CM | POA: Diagnosis not present

## 2022-12-23 DIAGNOSIS — Z944 Liver transplant status: Secondary | ICD-10-CM | POA: Diagnosis not present

## 2022-12-23 LAB — CBC W/ AUTO DIFF
BASOPHILS ABSOLUTE COUNT: 0.1 10*9/L (ref 0.0–0.2)
BASOPHILS RELATIVE PERCENT: 0.5 %
EOSINOPHILS ABSOLUTE COUNT: 0.3 10*9/L (ref 0.0–0.4)
EOSINOPHILS RELATIVE PERCENT: 3 %
HEMATOCRIT: 38.4 % (ref 34.0–44.0)
HEMOGLOBIN: 12.2 g/dL (ref 11.5–15.0)
LYMPHOCYTES ABSOLUTE COUNT: 2.3 10*9/L (ref 0.7–4.5)
LYMPHOCYTES RELATIVE PERCENT: 23.8 %
MEAN CORPUSCULAR HEMOGLOBIN CONC: 31.8 g/dL — ABNORMAL LOW (ref 32.0–36.0)
MEAN CORPUSCULAR HEMOGLOBIN: 31 pg (ref 27.0–34.0)
MEAN CORPUSCULAR VOLUME: 97.5 fL (ref 80.0–98.0)
MEAN PLATELET VOLUME: 9.7 fL (ref 7.4–10.4)
MONOCYTES ABSOLUTE COUNT: 0.8 10*9/L (ref 0.1–1.0)
MONOCYTES RELATIVE PERCENT: 7.8 %
NEUTROPHILS ABSOLUTE COUNT: 6.3 10*9/L (ref 1.8–7.8)
NEUTROPHILS RELATIVE PERCENT: 64.5 %
PLATELET COUNT: 204 10*9/L (ref 140–415)
RED BLOOD CELL COUNT: 3.94 10*12/L (ref 3.80–5.10)
RED CELL DISTRIBUTION WIDTH: 12.8 % (ref 11.5–14.5)
WBC ADJUSTED: 9.8 10*9/L (ref 4.0–10.5)

## 2022-12-23 LAB — COMPREHENSIVE METABOLIC PANEL
ALBUMIN: 3.7 g/dL (ref 3.5–5.0)
ALKALINE PHOSPHATASE: 73 U/L (ref 46–116)
ALT (SGPT): 22 U/L (ref 12–78)
ANION GAP: 6 mmol/L (ref 3–11)
AST (SGOT): 12 U/L — ABNORMAL LOW (ref 15–40)
BILIRUBIN TOTAL: 0.7 mg/dL (ref 0.3–1.2)
BLOOD UREA NITROGEN: 39 mg/dL — ABNORMAL HIGH (ref 8–20)
BUN / CREAT RATIO: 30
CALCIUM: 9 mg/dL (ref 8.5–10.1)
CHLORIDE: 105 mmol/L (ref 98–107)
CO2: 27.9 mmol/L (ref 21.0–32.0)
CREATININE: 1.31 mg/dL — ABNORMAL HIGH (ref 0.60–1.10)
EGFR CKD-EPI (2021) FEMALE: 41 mL/min/{1.73_m2} — ABNORMAL LOW (ref >=60)
GLUCOSE RANDOM: 108 mg/dL (ref 70–179)
POTASSIUM: 4.6 mmol/L (ref 3.5–5.0)
PROTEIN TOTAL: 6.8 g/dL (ref 6.0–8.0)
SODIUM: 139 mmol/L (ref 135–145)

## 2022-12-23 LAB — GAMMA GT: GAMMA GLUTAMYL TRANSFERASE: 38 U/L (ref 0–38)

## 2022-12-23 LAB — BILIRUBIN, DIRECT: BILIRUBIN DIRECT: 0.18 mg/dL (ref 0.10–0.30)

## 2022-12-23 LAB — MAGNESIUM: MAGNESIUM: 1.8 mg/dL (ref 1.6–2.6)

## 2022-12-23 LAB — PHOSPHORUS: PHOSPHORUS: 3.8 mg/dL (ref 2.4–5.1)

## 2022-12-24 LAB — TACROLIMUS LEVEL: TACROLIMUS BLOOD: 4.4 ng/mL

## 2022-12-27 NOTE — Unmapped (Signed)
Called pt and reviewed 9/20 lab results which were WNL. Pt was very relieved that her LFTs are still WNL after changing tac manufacturer.

## 2023-01-03 NOTE — Unmapped (Signed)
[facility] Home Delivery Pharmacy Refill Coordination Note    Specialty Medication(s) to be Shipped:   Transplant: tacrolimus 1mg     Other medication(s) to be shipped: No additional medications requested for fill at this time     Tamara Morris, DOB: 1940-10-09  Phone: [phone] (home)       All above HIPAA information was verified with patient.     Was a Nurse, learning disability used for this call? No    Completed refill call assessment today to schedule patient's medication shipment from the [facility] Pharm[facility]  [phone]).  All relevant notes have been reviewed.     Specialty medication(s) and dose(s) confirmed: Regimen is correct and unchanged.   Changes to medications: Tamara Morris reports no changes at this time.  Changes to insurance: No  New side effects reported not previously addressed with a pharmacist or physician: None reported  Questions for the pharmacist: No    Confirmed patient received a Conservation officer, historic buildings and a Surveyor, mining with first shipment. The patient will receive a drug information handout for each medication shipped and additional FDA Medication Guides as required.       DISEASE/MEDICATION-SPECIFIC INFORMATION        N/A    SPECIALTY MEDICATION ADHERENCE     Medication Adherence    Patient reported X missed doses in the last month: 0  Specialty Medication: tacrolimus 1 MG capsule (PROGRAF)  Patient is on additional specialty medications: No  Adherence tools used: patient uses a pill box to manage medications  Support network for adherence: family member              Were doses missed due to medication being on hold? No    Tacrolimus 1 mg: 3 days of medicine on hand        REFERRAL TO PHARMACIST     Referral to the pharmacist: Not needed      [facility]     Shipping address confirmed in Epic.       Delivery Scheduled: Yes, Expected medication delivery date: 01/06/23.     Medication will be delivered via UPS to the prescription address in Epic WAM.    [facility] Home Delivery Pharmacy  Specialty Technician

## 2023-01-03 NOTE — Unmapped (Signed)
Patient called back and would prefer Thursday delivery instead. Confirmed UPS to prescription address, delivery on 10/3.    Jonay Hitchcock A. Katrinka Blazing, PharmD, BCPS - Clinical Pharmacist   [facility]    [address], [place], [place] [address]  t [phone], opt 4 then 2 - f [phone]

## 2023-01-04 MED FILL — TACROLIMUS 1 MG CAPSULE, IMMEDIATE-RELEASE: ORAL | 30 days supply | Qty: 60 | Fill #8

## 2023-01-06 NOTE — Unmapped (Signed)
Pt left a VM questioning who referred her to in home health.    I called and spoke with pt she says they have everything taken care of and they spoke with her insurance company and they will contact us if they need anything further.

## 2023-01-07 DIAGNOSIS — I6521 Occlusion and stenosis of right carotid artery: Secondary | ICD-10-CM | POA: Diagnosis not present

## 2023-01-07 DIAGNOSIS — Z944 Liver transplant status: Secondary | ICD-10-CM | POA: Diagnosis not present

## 2023-01-07 DIAGNOSIS — Z87440 Personal history of urinary (tract) infections: Secondary | ICD-10-CM | POA: Diagnosis not present

## 2023-01-07 DIAGNOSIS — M545 Low back pain, unspecified: Secondary | ICD-10-CM | POA: Diagnosis not present

## 2023-01-07 DIAGNOSIS — E559 Vitamin D deficiency, unspecified: Secondary | ICD-10-CM | POA: Diagnosis not present

## 2023-01-07 DIAGNOSIS — Z7982 Long term (current) use of aspirin: Secondary | ICD-10-CM | POA: Diagnosis not present

## 2023-01-07 DIAGNOSIS — Z6832 Body mass index (BMI) 32.0-32.9, adult: Secondary | ICD-10-CM | POA: Diagnosis not present

## 2023-01-07 DIAGNOSIS — M542 Cervicalgia: Secondary | ICD-10-CM | POA: Diagnosis not present

## 2023-01-07 DIAGNOSIS — E782 Mixed hyperlipidemia: Secondary | ICD-10-CM | POA: Diagnosis not present

## 2023-01-07 DIAGNOSIS — E1165 Type 2 diabetes mellitus with hyperglycemia: Secondary | ICD-10-CM | POA: Diagnosis not present

## 2023-01-07 DIAGNOSIS — E875 Hyperkalemia: Secondary | ICD-10-CM | POA: Diagnosis not present

## 2023-01-07 DIAGNOSIS — R5383 Other fatigue: Secondary | ICD-10-CM | POA: Diagnosis not present

## 2023-01-07 DIAGNOSIS — M25551 Pain in right hip: Secondary | ICD-10-CM | POA: Diagnosis not present

## 2023-01-07 DIAGNOSIS — I129 Hypertensive chronic kidney disease with stage 1 through stage 4 chronic kidney disease, or unspecified chronic kidney disease: Secondary | ICD-10-CM | POA: Diagnosis not present

## 2023-01-07 DIAGNOSIS — M25562 Pain in left knee: Secondary | ICD-10-CM | POA: Diagnosis not present

## 2023-01-07 DIAGNOSIS — G8929 Other chronic pain: Secondary | ICD-10-CM | POA: Diagnosis not present

## 2023-01-07 DIAGNOSIS — K580 Irritable bowel syndrome with diarrhea: Secondary | ICD-10-CM | POA: Diagnosis not present

## 2023-01-07 DIAGNOSIS — N1831 Chronic kidney disease, stage 3a: Secondary | ICD-10-CM | POA: Diagnosis not present

## 2023-01-07 DIAGNOSIS — R202 Paresthesia of skin: Secondary | ICD-10-CM | POA: Diagnosis not present

## 2023-01-07 DIAGNOSIS — E669 Obesity, unspecified: Secondary | ICD-10-CM | POA: Diagnosis not present

## 2023-01-13 DIAGNOSIS — I6521 Occlusion and stenosis of right carotid artery: Secondary | ICD-10-CM | POA: Diagnosis not present

## 2023-01-13 DIAGNOSIS — M25551 Pain in right hip: Secondary | ICD-10-CM | POA: Diagnosis not present

## 2023-01-13 DIAGNOSIS — I129 Hypertensive chronic kidney disease with stage 1 through stage 4 chronic kidney disease, or unspecified chronic kidney disease: Secondary | ICD-10-CM | POA: Diagnosis not present

## 2023-01-13 DIAGNOSIS — N1831 Chronic kidney disease, stage 3a: Secondary | ICD-10-CM | POA: Diagnosis not present

## 2023-01-13 DIAGNOSIS — M545 Low back pain, unspecified: Secondary | ICD-10-CM | POA: Diagnosis not present

## 2023-01-13 DIAGNOSIS — G8929 Other chronic pain: Secondary | ICD-10-CM | POA: Diagnosis not present

## 2023-01-18 DIAGNOSIS — M25551 Pain in right hip: Secondary | ICD-10-CM | POA: Diagnosis not present

## 2023-01-18 DIAGNOSIS — I6521 Occlusion and stenosis of right carotid artery: Secondary | ICD-10-CM | POA: Diagnosis not present

## 2023-01-18 DIAGNOSIS — I129 Hypertensive chronic kidney disease with stage 1 through stage 4 chronic kidney disease, or unspecified chronic kidney disease: Secondary | ICD-10-CM | POA: Diagnosis not present

## 2023-01-18 DIAGNOSIS — G8929 Other chronic pain: Secondary | ICD-10-CM | POA: Diagnosis not present

## 2023-01-18 DIAGNOSIS — N1831 Chronic kidney disease, stage 3a: Secondary | ICD-10-CM | POA: Diagnosis not present

## 2023-01-18 DIAGNOSIS — M545 Low back pain, unspecified: Secondary | ICD-10-CM | POA: Diagnosis not present

## 2023-01-24 DIAGNOSIS — N1831 Chronic kidney disease, stage 3a: Secondary | ICD-10-CM | POA: Diagnosis not present

## 2023-01-24 DIAGNOSIS — I6521 Occlusion and stenosis of right carotid artery: Secondary | ICD-10-CM | POA: Diagnosis not present

## 2023-01-24 DIAGNOSIS — I129 Hypertensive chronic kidney disease with stage 1 through stage 4 chronic kidney disease, or unspecified chronic kidney disease: Secondary | ICD-10-CM | POA: Diagnosis not present

## 2023-01-24 DIAGNOSIS — M25551 Pain in right hip: Secondary | ICD-10-CM | POA: Diagnosis not present

## 2023-01-24 DIAGNOSIS — G8929 Other chronic pain: Secondary | ICD-10-CM | POA: Diagnosis not present

## 2023-01-24 DIAGNOSIS — M545 Low back pain, unspecified: Secondary | ICD-10-CM | POA: Diagnosis not present

## 2023-01-25 NOTE — Unmapped (Signed)
[facility] Specialty and Home Delivery Pharmacy Clinical Assessment & Refill Coordination Note    Tamara Morris, DOB: 05-07-1940  Phone: [phone] (home)     All above HIPAA information was verified with patient.     Was a Nurse, learning disability used for this call? No    Specialty Medication(s):   Transplant: tacrolimus 1mg      Current Outpatient Medications   Medication Sig Dispense Refill    acetaminophen (TYLENOL) 325 MG tablet Take 500 mg by mouth every six (6) hours as needed for pain.       aspirin (ECOTRIN) 81 MG tablet Take 1 tablet (81 mg total) by mouth daily.      atorvastatin (LIPITOR) 40 MG tablet Take 1 tablet (40 mg total) by mouth daily.      cholecalciferol, vitamin D3 25 mcg, 1,000 units,, 1,000 unit (25 mcg) tablet Take 1 tablet (25 mcg total) by mouth daily.      hydrALAZINE (APRESOLINE) 25 MG tablet Take 1 tablet (25 mg total) by mouth Three (3) times a day.      LORazepam (ATIVAN) 0.5 MG tablet Take 1 tablet (0.5 mg total) by mouth continuous as needed.      losartan (COZAAR) 100 MG tablet Take 1 tablet (100 mg total) by mouth daily.      tacrolimus (PROGRAF) 1 MG capsule Take 1 capsule (1 mg total) by mouth two (2) times a day. 60 capsule 11    verapamil (CALAN-SR) 240 MG CR tablet        No current facility-administered medications for this visit.        Changes to medications: Lashel reports no changes at this time.    Allergies   Allergen Reactions    Codeine Other (See Comments)    Hydromorphone Other (See Comments)    Meperidine Other (See Comments)       Changes to allergies: No    SPECIALTY MEDICATION ADHERENCE     Tacrolimus 1mg   : 10 days of medicine on hand       Medication Adherence    Patient reported X missed doses in the last month: 0  Specialty Medication: TACROLIMUS 1MG   Patient is on additional specialty medications: No  Adherence tools used: patient uses a pill box to manage medications  Support network for adherence: family member          Specialty medication(s) dose(s) confirmed: Regimen is correct and unchanged.     Are there any concerns with adherence? No    Adherence counseling provided? Not needed    CLINICAL MANAGEMENT AND INTERVENTION      Clinical Benefit Assessment:    Do you feel the medicine is effective or helping your condition? Yes    Clinical Benefit counseling provided? Not needed    Adverse Effects Assessment:    Are you experiencing any side effects? No    Are you experiencing difficulty administering your medicine? No    Quality of Life Assessment:    Quality of Life    Rheumatology  Oncology  Dermatology  Cystic Fibrosis          How many days over the past month did your transplant  keep you from your normal activities? For example, brushing your teeth or getting up in the morning. 0    Have you discussed this with your provider? Not needed    Acute Infection Status:    Acute infections noted within Epic:  No active infections  Patient reported infection: None  Therapy Appropriateness:    Is therapy appropriate based on current medication list, adverse reactions, adherence, clinical benefit and progress toward achieving therapeutic goals? Yes, therapy is appropriate and should be continued     DISEASE/MEDICATION-SPECIFIC INFORMATION      N/A    Solid Organ Transplant: Not Applicable    PATIENT SPECIFIC NEEDS     Does the patient have any physical, cognitive, or cultural barriers? No    Is the patient high risk? No    Did the patient require a clinical intervention? No    Does the patient require physician intervention or other additional services (i.e., nutrition, smoking cessation, social work)? No    SOCIAL DETERMINANTS OF HEALTH     At the [facility], we have learned that life circumstances - like trouble affording food, housing, utilities, or transportation can affect the health of many of our patients.   That is why we wanted to ask: are you currently experiencing any life circumstances that are negatively impacting your health and/or quality of life? Patient declined to answer    Social Determinants of Health     Food Insecurity: Not on file   Internet Connectivity: Not on file   Housing/Utilities: Not on file   Tobacco Use: Medium Risk (12/01/2022)    Patient History     Smoking Tobacco Use: Former     Smokeless Tobacco Use: Never     Passive Exposure: Not on file   Transportation Needs: Not on file   Alcohol Use: Not on file   Interpersonal Safety: Unknown (01/25/2023)    Interpersonal Safety     Unsafe Where You Currently Live: Not on file     Physically Hurt by Anyone: Not on file     Abused by Anyone: Not on file   Physical Activity: Not on file   Intimate Partner Violence: Not on file   Stress: Not on file   Substance Use: Not on file   Social Connections: Not on file   Financial Resource Strain: Not on file   Depression: Not on file   Health Literacy: Not on file       Would you be willing to receive help with any of the needs that you have identified today? Not applicable       SHIPPING     Specialty Medication(s) to be Shipped:   Transplant: tacrolimus 1mg     Other medication(s) to be shipped: No additional medications requested for fill at this time     Changes to insurance: No    Delivery Scheduled: Yes, Expected medication delivery date: 02/01/2023.     Medication will be delivered via UPS to the confirmed prescription address in [facility].    The patient will receive a drug information handout for each medication shipped and additional FDA Medication Guides as required.  Verified that patient has previously received a Conservation officer, historic buildings and a Surveyor, mining.    The patient or caregiver noted above participated in the development of this care plan and knows that they can request review of or adjustments to the care plan at any time.      All of the patient's questions and concerns have been addressed.    Thad Ranger, PharmD   [facility] Delivery Pharm[facility] Specialty Pharmacist

## 2023-01-26 DIAGNOSIS — M545 Low back pain, unspecified: Secondary | ICD-10-CM | POA: Diagnosis not present

## 2023-01-26 DIAGNOSIS — I6521 Occlusion and stenosis of right carotid artery: Secondary | ICD-10-CM | POA: Diagnosis not present

## 2023-01-26 DIAGNOSIS — I129 Hypertensive chronic kidney disease with stage 1 through stage 4 chronic kidney disease, or unspecified chronic kidney disease: Secondary | ICD-10-CM | POA: Diagnosis not present

## 2023-01-26 DIAGNOSIS — M25551 Pain in right hip: Secondary | ICD-10-CM | POA: Diagnosis not present

## 2023-01-26 DIAGNOSIS — N1831 Chronic kidney disease, stage 3a: Secondary | ICD-10-CM | POA: Diagnosis not present

## 2023-01-26 DIAGNOSIS — G8929 Other chronic pain: Secondary | ICD-10-CM | POA: Diagnosis not present

## 2023-01-30 DIAGNOSIS — Z23 Encounter for immunization: Secondary | ICD-10-CM | POA: Diagnosis not present

## 2023-01-31 DIAGNOSIS — M25551 Pain in right hip: Secondary | ICD-10-CM | POA: Diagnosis not present

## 2023-01-31 DIAGNOSIS — G8929 Other chronic pain: Secondary | ICD-10-CM | POA: Diagnosis not present

## 2023-01-31 DIAGNOSIS — I6521 Occlusion and stenosis of right carotid artery: Secondary | ICD-10-CM | POA: Diagnosis not present

## 2023-01-31 DIAGNOSIS — M545 Low back pain, unspecified: Secondary | ICD-10-CM | POA: Diagnosis not present

## 2023-01-31 DIAGNOSIS — I129 Hypertensive chronic kidney disease with stage 1 through stage 4 chronic kidney disease, or unspecified chronic kidney disease: Secondary | ICD-10-CM | POA: Diagnosis not present

## 2023-01-31 DIAGNOSIS — N1831 Chronic kidney disease, stage 3a: Secondary | ICD-10-CM | POA: Diagnosis not present

## 2023-01-31 MED FILL — TACROLIMUS 1 MG CAPSULE, IMMEDIATE-RELEASE: ORAL | 30 days supply | Qty: 60 | Fill #9

## 2023-02-02 DIAGNOSIS — I129 Hypertensive chronic kidney disease with stage 1 through stage 4 chronic kidney disease, or unspecified chronic kidney disease: Secondary | ICD-10-CM | POA: Diagnosis not present

## 2023-02-02 DIAGNOSIS — M545 Low back pain, unspecified: Secondary | ICD-10-CM | POA: Diagnosis not present

## 2023-02-02 DIAGNOSIS — N1831 Chronic kidney disease, stage 3a: Secondary | ICD-10-CM | POA: Diagnosis not present

## 2023-02-02 DIAGNOSIS — I6521 Occlusion and stenosis of right carotid artery: Secondary | ICD-10-CM | POA: Diagnosis not present

## 2023-02-02 DIAGNOSIS — G8929 Other chronic pain: Secondary | ICD-10-CM | POA: Diagnosis not present

## 2023-02-02 DIAGNOSIS — M25551 Pain in right hip: Secondary | ICD-10-CM | POA: Diagnosis not present

## 2023-02-03 NOTE — Unmapped (Signed)
[facility] - Replacement Package    Joory Gough 's package containing tacrolimus is being replaced due to lost in transit.    UPS Tracking Number: [id number]     Replacement package approved and scheduled for delivery via UPS to the prescription address in Epic WAM. Expected delivery 02/05/23

## 2023-02-06 DIAGNOSIS — M545 Low back pain, unspecified: Secondary | ICD-10-CM | POA: Diagnosis not present

## 2023-02-06 DIAGNOSIS — M25562 Pain in left knee: Secondary | ICD-10-CM | POA: Diagnosis not present

## 2023-02-06 DIAGNOSIS — Z944 Liver transplant status: Secondary | ICD-10-CM | POA: Diagnosis not present

## 2023-02-06 DIAGNOSIS — K580 Irritable bowel syndrome with diarrhea: Secondary | ICD-10-CM | POA: Diagnosis not present

## 2023-02-06 DIAGNOSIS — E875 Hyperkalemia: Secondary | ICD-10-CM | POA: Diagnosis not present

## 2023-02-06 DIAGNOSIS — E559 Vitamin D deficiency, unspecified: Secondary | ICD-10-CM | POA: Diagnosis not present

## 2023-02-06 DIAGNOSIS — Z7982 Long term (current) use of aspirin: Secondary | ICD-10-CM | POA: Diagnosis not present

## 2023-02-06 DIAGNOSIS — M542 Cervicalgia: Secondary | ICD-10-CM | POA: Diagnosis not present

## 2023-02-06 DIAGNOSIS — E1165 Type 2 diabetes mellitus with hyperglycemia: Secondary | ICD-10-CM | POA: Diagnosis not present

## 2023-02-06 DIAGNOSIS — I6521 Occlusion and stenosis of right carotid artery: Secondary | ICD-10-CM | POA: Diagnosis not present

## 2023-02-06 DIAGNOSIS — Z87440 Personal history of urinary (tract) infections: Secondary | ICD-10-CM | POA: Diagnosis not present

## 2023-02-06 DIAGNOSIS — I129 Hypertensive chronic kidney disease with stage 1 through stage 4 chronic kidney disease, or unspecified chronic kidney disease: Secondary | ICD-10-CM | POA: Diagnosis not present

## 2023-02-06 DIAGNOSIS — N1831 Chronic kidney disease, stage 3a: Secondary | ICD-10-CM | POA: Diagnosis not present

## 2023-02-06 DIAGNOSIS — G8929 Other chronic pain: Secondary | ICD-10-CM | POA: Diagnosis not present

## 2023-02-06 DIAGNOSIS — E669 Obesity, unspecified: Secondary | ICD-10-CM | POA: Diagnosis not present

## 2023-02-06 DIAGNOSIS — R202 Paresthesia of skin: Secondary | ICD-10-CM | POA: Diagnosis not present

## 2023-02-06 DIAGNOSIS — Z6832 Body mass index (BMI) 32.0-32.9, adult: Secondary | ICD-10-CM | POA: Diagnosis not present

## 2023-02-06 DIAGNOSIS — M25551 Pain in right hip: Secondary | ICD-10-CM | POA: Diagnosis not present

## 2023-02-06 DIAGNOSIS — R5383 Other fatigue: Secondary | ICD-10-CM | POA: Diagnosis not present

## 2023-02-06 DIAGNOSIS — E782 Mixed hyperlipidemia: Secondary | ICD-10-CM | POA: Diagnosis not present

## 2023-02-07 DIAGNOSIS — I129 Hypertensive chronic kidney disease with stage 1 through stage 4 chronic kidney disease, or unspecified chronic kidney disease: Secondary | ICD-10-CM | POA: Diagnosis not present

## 2023-02-07 DIAGNOSIS — G8929 Other chronic pain: Secondary | ICD-10-CM | POA: Diagnosis not present

## 2023-02-07 DIAGNOSIS — M25551 Pain in right hip: Secondary | ICD-10-CM | POA: Diagnosis not present

## 2023-02-07 DIAGNOSIS — N1831 Chronic kidney disease, stage 3a: Secondary | ICD-10-CM | POA: Diagnosis not present

## 2023-02-07 DIAGNOSIS — M545 Low back pain, unspecified: Secondary | ICD-10-CM | POA: Diagnosis not present

## 2023-02-07 DIAGNOSIS — I6521 Occlusion and stenosis of right carotid artery: Secondary | ICD-10-CM | POA: Diagnosis not present

## 2023-02-16 DIAGNOSIS — I129 Hypertensive chronic kidney disease with stage 1 through stage 4 chronic kidney disease, or unspecified chronic kidney disease: Secondary | ICD-10-CM | POA: Diagnosis not present

## 2023-02-16 DIAGNOSIS — N1831 Chronic kidney disease, stage 3a: Secondary | ICD-10-CM | POA: Diagnosis not present

## 2023-02-16 DIAGNOSIS — G8929 Other chronic pain: Secondary | ICD-10-CM | POA: Diagnosis not present

## 2023-02-16 DIAGNOSIS — M545 Low back pain, unspecified: Secondary | ICD-10-CM | POA: Diagnosis not present

## 2023-02-16 DIAGNOSIS — I6521 Occlusion and stenosis of right carotid artery: Secondary | ICD-10-CM | POA: Diagnosis not present

## 2023-02-16 DIAGNOSIS — M25551 Pain in right hip: Secondary | ICD-10-CM | POA: Diagnosis not present

## 2023-03-01 NOTE — Unmapped (Signed)
The [facility] has made a second and final attempt to reach this patient to refill the following medication:tacrolimus 1 MG capsule (PROGRAF).      We have left voicemails on the following phone numbers: [phone] and [phone] and have sent a text message to the following phone numbers: [phone] .    Dates contacted: 02/23/23-03/01/23  Last scheduled delivery: 02/03/23    The patient may be at risk of non-compliance with this medication. The patient should call the [facility] at [phone]  Option 4, then Option 4: Infectious Disease, Transplant to refill medication.    Craige Cotta   [place] [facility]

## 2023-03-04 DIAGNOSIS — R070 Pain in throat: Secondary | ICD-10-CM | POA: Diagnosis not present

## 2023-03-04 DIAGNOSIS — Z20822 Contact with and (suspected) exposure to covid-19: Secondary | ICD-10-CM | POA: Diagnosis not present

## 2023-03-04 DIAGNOSIS — J00 Acute nasopharyngitis [common cold]: Secondary | ICD-10-CM | POA: Diagnosis not present

## 2023-03-06 NOTE — Unmapped (Signed)
[facility] Specialty and Home Delivery Pharmacy Refill Coordination Note    Specialty Medication(s) to be Shipped:   Transplant: tacrolimus 1 MG capsule (PROGRAF)    Other medication(s) to be shipped: No additional medications requested for fill at this time     Tamara Morris, DOB: 12/05/1940  Phone: [phone] (home)       All above HIPAA information was verified with patient.     Was a Nurse, learning disability used for this call? No    Completed refill call assessment today to schedule patient's medication shipment from the [facility] and [facility] Delivery Pharmacy  [phone]).  All relevant notes have been reviewed.     Specialty medication(s) and dose(s) confirmed: Regimen is correct and unchanged.   Changes to medications: Tamara Morris reports no changes at this time.  Changes to insurance: No  New side effects reported not previously addressed with a pharmacist or physician: None reported  Questions for the pharmacist: No    Confirmed patient received a Conservation officer, historic buildings and a Surveyor, mining with first shipment. The patient will receive a drug information handout for each medication shipped and additional FDA Medication Guides as required.       DISEASE/MEDICATION-SPECIFIC INFORMATION        N/A    SPECIALTY MEDICATION ADHERENCE     Medication Adherence    Patient reported X missed doses in the last month: 1  Specialty Medication: tacrolimus 1 MG capsule (PROGRAF)  Patient is on additional specialty medications: No  Patient is on more than two specialty medications: No  Any gaps in refill history greater than 2 weeks in the last 3 months: no  Demonstrates understanding of importance of adherence: yes  Informant: patient  Reliability of informant: reliable  Provider-estimated medication adherence level: good  Patient is at risk for Non-Adherence: No  Reasons for non-adherence: no problems identified  Adherence tools used: patient uses a pill box to manage medications  Support network for adherence: family member Were doses missed due to medication being on hold? No    tacrolimus 1 MG capsule (PROGRAF)  : 3 days of medicine on hand       REFERRAL TO PHARMACIST     Referral to the pharmacist: Not needed      [facility]     Shipping address confirmed in Epic.       Delivery Scheduled: Yes, Expected medication delivery date: 03/08/23.     Medication will be delivered via UPS to the prescription address in Epic WAM.    [facility]  Specialty Technician

## 2023-03-07 MED FILL — TACROLIMUS 1 MG CAPSULE, IMMEDIATE-RELEASE: ORAL | 30 days supply | Qty: 60 | Fill #10

## 2023-03-08 DIAGNOSIS — I129 Hypertensive chronic kidney disease with stage 1 through stage 4 chronic kidney disease, or unspecified chronic kidney disease: Secondary | ICD-10-CM | POA: Diagnosis not present

## 2023-03-08 DIAGNOSIS — M25562 Pain in left knee: Secondary | ICD-10-CM | POA: Diagnosis not present

## 2023-03-08 DIAGNOSIS — Z6832 Body mass index (BMI) 32.0-32.9, adult: Secondary | ICD-10-CM | POA: Diagnosis not present

## 2023-03-08 DIAGNOSIS — G8929 Other chronic pain: Secondary | ICD-10-CM | POA: Diagnosis not present

## 2023-03-08 DIAGNOSIS — R202 Paresthesia of skin: Secondary | ICD-10-CM | POA: Diagnosis not present

## 2023-03-08 DIAGNOSIS — M545 Low back pain, unspecified: Secondary | ICD-10-CM | POA: Diagnosis not present

## 2023-03-08 DIAGNOSIS — E559 Vitamin D deficiency, unspecified: Secondary | ICD-10-CM | POA: Diagnosis not present

## 2023-03-08 DIAGNOSIS — I6521 Occlusion and stenosis of right carotid artery: Secondary | ICD-10-CM | POA: Diagnosis not present

## 2023-03-08 DIAGNOSIS — E669 Obesity, unspecified: Secondary | ICD-10-CM | POA: Diagnosis not present

## 2023-03-08 DIAGNOSIS — E1165 Type 2 diabetes mellitus with hyperglycemia: Secondary | ICD-10-CM | POA: Diagnosis not present

## 2023-03-08 DIAGNOSIS — M542 Cervicalgia: Secondary | ICD-10-CM | POA: Diagnosis not present

## 2023-03-08 DIAGNOSIS — Z944 Liver transplant status: Secondary | ICD-10-CM | POA: Diagnosis not present

## 2023-03-08 DIAGNOSIS — Z7982 Long term (current) use of aspirin: Secondary | ICD-10-CM | POA: Diagnosis not present

## 2023-03-08 DIAGNOSIS — M25551 Pain in right hip: Secondary | ICD-10-CM | POA: Diagnosis not present

## 2023-03-08 DIAGNOSIS — E782 Mixed hyperlipidemia: Secondary | ICD-10-CM | POA: Diagnosis not present

## 2023-03-08 DIAGNOSIS — N1831 Chronic kidney disease, stage 3a: Secondary | ICD-10-CM | POA: Diagnosis not present

## 2023-03-08 DIAGNOSIS — E875 Hyperkalemia: Secondary | ICD-10-CM | POA: Diagnosis not present

## 2023-03-08 DIAGNOSIS — Z87440 Personal history of urinary (tract) infections: Secondary | ICD-10-CM | POA: Diagnosis not present

## 2023-03-08 DIAGNOSIS — K580 Irritable bowel syndrome with diarrhea: Secondary | ICD-10-CM | POA: Diagnosis not present

## 2023-03-08 DIAGNOSIS — R5383 Other fatigue: Secondary | ICD-10-CM | POA: Diagnosis not present

## 2023-03-09 DIAGNOSIS — Z944 Liver transplant status: Secondary | ICD-10-CM | POA: Diagnosis not present

## 2023-03-09 DIAGNOSIS — J069 Acute upper respiratory infection, unspecified: Secondary | ICD-10-CM | POA: Diagnosis not present

## 2023-03-09 DIAGNOSIS — J302 Other seasonal allergic rhinitis: Secondary | ICD-10-CM | POA: Diagnosis not present

## 2023-03-10 DIAGNOSIS — I6521 Occlusion and stenosis of right carotid artery: Secondary | ICD-10-CM | POA: Diagnosis not present

## 2023-03-10 DIAGNOSIS — N1831 Chronic kidney disease, stage 3a: Secondary | ICD-10-CM | POA: Diagnosis not present

## 2023-03-10 DIAGNOSIS — I129 Hypertensive chronic kidney disease with stage 1 through stage 4 chronic kidney disease, or unspecified chronic kidney disease: Secondary | ICD-10-CM | POA: Diagnosis not present

## 2023-03-10 DIAGNOSIS — G8929 Other chronic pain: Secondary | ICD-10-CM | POA: Diagnosis not present

## 2023-03-10 DIAGNOSIS — M545 Low back pain, unspecified: Secondary | ICD-10-CM | POA: Diagnosis not present

## 2023-03-10 DIAGNOSIS — M25551 Pain in right hip: Secondary | ICD-10-CM | POA: Diagnosis not present

## 2023-03-15 DIAGNOSIS — N1831 Chronic kidney disease, stage 3a: Secondary | ICD-10-CM | POA: Diagnosis not present

## 2023-03-15 DIAGNOSIS — M25551 Pain in right hip: Secondary | ICD-10-CM | POA: Diagnosis not present

## 2023-03-15 DIAGNOSIS — I129 Hypertensive chronic kidney disease with stage 1 through stage 4 chronic kidney disease, or unspecified chronic kidney disease: Secondary | ICD-10-CM | POA: Diagnosis not present

## 2023-03-15 DIAGNOSIS — G8929 Other chronic pain: Secondary | ICD-10-CM | POA: Diagnosis not present

## 2023-03-15 DIAGNOSIS — I6521 Occlusion and stenosis of right carotid artery: Secondary | ICD-10-CM | POA: Diagnosis not present

## 2023-03-15 DIAGNOSIS — M545 Low back pain, unspecified: Secondary | ICD-10-CM | POA: Diagnosis not present

## 2023-03-17 DIAGNOSIS — B309 Viral conjunctivitis, unspecified: Secondary | ICD-10-CM | POA: Diagnosis not present

## 2023-03-17 DIAGNOSIS — J069 Acute upper respiratory infection, unspecified: Secondary | ICD-10-CM | POA: Diagnosis not present

## 2023-03-21 NOTE — Unmapped (Signed)
Tamara Morris patient daughter called twice.     Patient was given referral for home health but her insurance is not wanting to pay for some visits not sure if we needs to fax referral to insurance or what is needed to show we provided referral for patient.     Advised for her to reach out to the home health to get more information

## 2023-03-24 DIAGNOSIS — G8929 Other chronic pain: Secondary | ICD-10-CM | POA: Diagnosis not present

## 2023-03-24 DIAGNOSIS — M25551 Pain in right hip: Secondary | ICD-10-CM | POA: Diagnosis not present

## 2023-03-24 DIAGNOSIS — I129 Hypertensive chronic kidney disease with stage 1 through stage 4 chronic kidney disease, or unspecified chronic kidney disease: Secondary | ICD-10-CM | POA: Diagnosis not present

## 2023-03-24 DIAGNOSIS — I6521 Occlusion and stenosis of right carotid artery: Secondary | ICD-10-CM | POA: Diagnosis not present

## 2023-03-24 DIAGNOSIS — M545 Low back pain, unspecified: Secondary | ICD-10-CM | POA: Diagnosis not present

## 2023-03-24 DIAGNOSIS — N1831 Chronic kidney disease, stage 3a: Secondary | ICD-10-CM | POA: Diagnosis not present

## 2023-03-31 DIAGNOSIS — I129 Hypertensive chronic kidney disease with stage 1 through stage 4 chronic kidney disease, or unspecified chronic kidney disease: Secondary | ICD-10-CM | POA: Diagnosis not present

## 2023-03-31 DIAGNOSIS — I6521 Occlusion and stenosis of right carotid artery: Secondary | ICD-10-CM | POA: Diagnosis not present

## 2023-03-31 DIAGNOSIS — M25551 Pain in right hip: Secondary | ICD-10-CM | POA: Diagnosis not present

## 2023-03-31 DIAGNOSIS — M545 Low back pain, unspecified: Secondary | ICD-10-CM | POA: Diagnosis not present

## 2023-03-31 DIAGNOSIS — G8929 Other chronic pain: Secondary | ICD-10-CM | POA: Diagnosis not present

## 2023-03-31 DIAGNOSIS — N1831 Chronic kidney disease, stage 3a: Secondary | ICD-10-CM | POA: Diagnosis not present

## 2023-04-06 DIAGNOSIS — I6521 Occlusion and stenosis of right carotid artery: Secondary | ICD-10-CM | POA: Diagnosis not present

## 2023-04-06 DIAGNOSIS — I129 Hypertensive chronic kidney disease with stage 1 through stage 4 chronic kidney disease, or unspecified chronic kidney disease: Secondary | ICD-10-CM | POA: Diagnosis not present

## 2023-04-06 DIAGNOSIS — N1831 Chronic kidney disease, stage 3a: Secondary | ICD-10-CM | POA: Diagnosis not present

## 2023-04-06 DIAGNOSIS — G8929 Other chronic pain: Secondary | ICD-10-CM | POA: Diagnosis not present

## 2023-04-06 DIAGNOSIS — M545 Low back pain, unspecified: Secondary | ICD-10-CM | POA: Diagnosis not present

## 2023-04-06 DIAGNOSIS — M25551 Pain in right hip: Secondary | ICD-10-CM | POA: Diagnosis not present

## 2023-04-06 MED FILL — TACROLIMUS 1 MG CAPSULE, IMMEDIATE-RELEASE: ORAL | 30 days supply | Qty: 60 | Fill #11

## 2023-04-06 NOTE — Unmapped (Signed)
[facility] Refill Coordination Note    Specialty Medication(s) to be Shipped:   Transplant: tacrolimus 1mg     Other medication(s) to be shipped: No additional medications requested for fill at this time     Tamara Morris, DOB: 05/26/1940  Phone: [phone] (home)       All above HIPAA information was verified with patient.     Was a Nurse, learning disability used for this call? No    Completed refill call assessment today to schedule patient's medication shipment from the [facility]  [phone]).  All relevant notes have been reviewed.     Specialty medication(s) and dose(s) confirmed: Regimen is correct and unchanged.   Changes to medications: Tamara Morris reports no changes at this time.  Changes to insurance: No  New side effects reported not previously addressed with a pharmacist or physician: None reported  Questions for the pharmacist: No    Confirmed patient received a Conservation officer, historic buildings and a Surveyor, mining with first shipment. The patient will receive a drug information handout for each medication shipped and additional FDA Medication Guides as required.       DISEASE/MEDICATION-SPECIFIC INFORMATION        N/A    SPECIALTY MEDICATION ADHERENCE     Medication Adherence    Patient reported X missed doses in the last month: 0  Specialty Medication: tacrolimus 1 MG capsule (PROGRAF)  Patient is on additional specialty medications: No  Adherence tools used: patient uses a pill box to manage medications  Support network for adherence: family member              Were doses missed due to medication being on hold? No    tacrolimus 1 mg: 2 days of medicine on hand       REFERRAL TO PHARMACIST     Referral to the pharmacist: Not needed      [facility]     Shipping address confirmed in Epic.       Delivery Scheduled: Yes, Expected medication delivery date: 04/07/2023.     Medication will be delivered via UPS to the prescription address in Epic WAM.    Tamara Morris   [facility]  Specialty Technician

## 2023-04-24 ENCOUNTER — Other Ambulatory Visit: Payer: Self-pay

## 2023-04-24 DIAGNOSIS — I6523 Occlusion and stenosis of bilateral carotid arteries: Secondary | ICD-10-CM

## 2023-05-03 DIAGNOSIS — Z944 Liver transplant status: Principal | ICD-10-CM

## 2023-05-03 MED ORDER — TACROLIMUS 1 MG CAPSULE, IMMEDIATE-RELEASE
ORAL_CAPSULE | Freq: Two times a day (BID) | ORAL | 11 refills | 30.00 days | Status: CP
Start: 2023-05-03 — End: ?
  Filled 2023-05-08: qty 60, 30d supply, fill #0

## 2023-05-03 NOTE — Unmapped (Signed)
[facility] Delivery Pharm[facility] Refill Coordination Note    Specialty Medication(s) to be Shipped:   Transplant: tacrolimus 1mg     Other medication(s) to be shipped: No additional medications requested for fill at this time     Tamara Morris, DOB: [birth year]  Phone: [phone] (home)       All above HIPAA information was verified with patient.     Was a Nurse, learning disability used for this call? No    Completed refill call assessment today to schedule patient's medication shipment from the [facility]  [phone]).  All relevant notes have been reviewed.     Specialty medication(s) and dose(s) confirmed: Regimen is correct and unchanged.   Changes to medications: Tamara Morris reports no changes at this time.  Changes to insurance: No  New side effects reported not previously addressed with a pharmacist or physician: None reported  Questions for the pharmacist: No    Confirmed patient received a Conservation officer, historic buildings and a Surveyor, mining with first shipment. The patient will receive a drug information handout for each medication shipped and additional FDA Medication Guides as required.       DISEASE/MEDICATION-SPECIFIC INFORMATION        N/A    SPECIALTY MEDICATION ADHERENCE     Medication Adherence    Patient reported X missed doses in the last month: 0  Specialty Medication: tacrolimus 1 MG capsule (PROGRAF)  Adherence tools used: patient uses a pill box to manage medications  Support network for adherence: family member              Were doses missed due to medication being on hold? No    tacrolimus 1 mg: 5 days of medicine on hand       REFERRAL TO PHARMACIST     Referral to the pharmacist: Not needed      [facility]     Shipping address confirmed in Epic.       Delivery Scheduled: Yes, Expected medication delivery date: 2/4.     Medication will be delivered via UPS to the prescription address in Epic WAM.    Clydell Hakim, PharmD   [facility] Specialty Pharmacist

## 2023-05-03 NOTE — Unmapped (Signed)
The patient is requesting a medication refill

## 2023-05-04 ENCOUNTER — Ambulatory Visit: Payer: No Typology Code available for payment source

## 2023-05-04 ENCOUNTER — Ambulatory Visit (HOSPITAL_COMMUNITY): Payer: No Typology Code available for payment source

## 2023-05-05 DIAGNOSIS — E1165 Type 2 diabetes mellitus with hyperglycemia: Secondary | ICD-10-CM | POA: Diagnosis not present

## 2023-05-05 DIAGNOSIS — E559 Vitamin D deficiency, unspecified: Secondary | ICD-10-CM | POA: Diagnosis not present

## 2023-05-05 DIAGNOSIS — E782 Mixed hyperlipidemia: Secondary | ICD-10-CM | POA: Diagnosis not present

## 2023-05-05 NOTE — Progress Notes (Unsigned)
  Intake history:  There were no vitals taken for this visit. There is no height or weight on file to calculate BMI.    WHAT ARE WE SEEING YOU FOR TODAY?   {Location ext GNFA:21308}  How long has this bothered you? (DOI?DOS?WS?)  {TIME; AGO:17960}  Anticoag.  {yes/no:20286}  Diabetes {yes/no:20286}  Heart disease {yes/no:20286}  Hypertension {yes/no:20286}  SMOKING HX {yes/no:20286}  Kidney disease {yes/no:20286}  Any ALLERGIES ______________________________________________   Treatment:  Have you taken:  Tylenol {yes/no:20286}  Advil {yes/no:20286}  Had PT {yes/no:20286}  Had injection {yes/no:20286}  Other  _________________________

## 2023-05-08 ENCOUNTER — Other Ambulatory Visit (INDEPENDENT_AMBULATORY_CARE_PROVIDER_SITE_OTHER): Payer: No Typology Code available for payment source

## 2023-05-08 ENCOUNTER — Encounter: Payer: Self-pay | Admitting: Orthopedic Surgery

## 2023-05-08 ENCOUNTER — Ambulatory Visit (INDEPENDENT_AMBULATORY_CARE_PROVIDER_SITE_OTHER): Payer: Medicare Other | Admitting: Orthopedic Surgery

## 2023-05-08 VITALS — BP 113/68 | HR 73 | Ht 64.0 in | Wt 193.0 lb

## 2023-05-08 DIAGNOSIS — M25511 Pain in right shoulder: Secondary | ICD-10-CM

## 2023-05-08 DIAGNOSIS — G8929 Other chronic pain: Secondary | ICD-10-CM

## 2023-05-08 DIAGNOSIS — M75101 Unspecified rotator cuff tear or rupture of right shoulder, not specified as traumatic: Secondary | ICD-10-CM

## 2023-05-08 MED ORDER — METHYLPREDNISOLONE ACETATE 40 MG/ML IJ SUSP
40.0000 mg | Freq: Once | INTRAMUSCULAR | Status: AC
  Administered 2023-05-08: 40 mg via INTRA_ARTICULAR

## 2023-05-08 NOTE — Progress Notes (Addendum)
Patient ID: Linda Barr, female   DOB: 1940-11-07, 83 y.o.   MRN: [id number]  SUMMARY AND PLAN:  Patient appears to have subacromial pathology could be secondary to ambulating with a walker requiring full weightbearing with her arms  Subacromial injection follow-up 4 weeks   Procedure note the subacromial injection shoulder RIGHT    Verbal consent was obtained to inject the  RIGHT   Shoulder  Timeout was completed to confirm the injection site is a subacromial space of the  RIGHT  shoulder   Medication used Depo-Medrol 40 mg and lidocaine 1% 3 cc  Anesthesia was provided by ethyl chloride  The injection was performed in the RIGHT  posterior subacromial space. After pinning the skin with alcohol and anesthetized the skin with ethyl chloride the subacromial space was injected using a 20-gauge needle. There were no complications  Sterile dressing was applied.      Chief Complaint  Patient presents with   Shoulder Pain    Pain in the right shoulder x 2 weeks now       HPI Linda Barr is a 83 y.o. female.  Presents for evaluation and management of right shoulder pain x 2 weeks no history of trauma.  Patient ambulates with a walker uses her arms to help support her weight  Treatment:  Nsaids no  PT no  Injection no  Current Outpatient Medications  Medication Instructions   acetaminophen (TYLENOL) 500 mg, Oral, Every 6 hours PRN   aspirin 81 mg, Oral, Daily PRN   hydrALAZINE (APRESOLINE) 25 mg, Oral, 2 times daily   lisinopril (ZESTRIL) 20 mg, Oral, Daily   LORazepam (ATIVAN) 0.5 mg, Oral, Daily PRN   losartan (COZAAR) 100 mg, Oral, Daily   olmesartan (BENICAR) 20 mg, Oral, Daily   simvastatin (ZOCOR) 10 MG tablet TAKE 1 TABLET BY MOUTH EVERY DAY AT NIGHT   sodium chloride (OCEAN) 0.65 % SOLN nasal spray 1 spray, Each Nare, Every 4 hours PRN   tacrolimus (PROGRAF) 1 mg, 2 times daily   verapamil (CALAN-SR) 240 mg, Daily    Allergies  Allergen Reactions    Codeine Other (See Comments)    Hallucinate, vomiting and sweating   Demerol [Meperidine] Other (See Comments)    Hallucinations, vomiting and sweating.    Dilaudid [Hydromorphone Hcl] Other (See Comments)    Hallucinations, sweating and vomiting   Hydromorphone Other (See Comments)   Morphine And Codeine Other (See Comments)    Hallucinations, vomiting and sweating    Review of Systems ROS -left hand trigger fingers contracture left long finger  Past Medical History:  Diagnosis Date   Anxiety    Arthritis    Chronic back pain    Essential hypertension    History of ventricular Bigemy, followed up with cardiologist ,    Gout    History of kidney stones    Hx of liver transplant (HCC)    1994, 1995 at [facility]   Hyperlipidemia    Kidney stones    Palpitations     Past Surgical History:  Procedure Laterality Date   ABDOMINAL HYSTERECTOMY     APPENDECTOMY     BACK SURGERY     x3; plates and screws   CATARACT EXTRACTION W/PHACO Left 06/27/2016   Procedure: CATARACT EXTRACTION PHACO AND INTRAOCULAR LENS PLACEMENT (IOC);  Surgeon: Gemma Payor, MD;  Location: [facility];  Service: Ophthalmology;  Laterality: Left;  CDE: 7.94   CATARACT EXTRACTION W/PHACO Right 07/28/2016   Procedure: CATARACT  EXTRACTION PHACO AND INTRAOCULAR LENS PLACEMENT RIGHT EYE CDE=6.33;  Surgeon: Gemma Payor, MD;  Location: [facility];  Service: Ophthalmology;  Laterality: Right;  right   LIVER TRANSPLANT      Family History  Problem Relation Age of Onset   Stroke Mother    Stroke Other      Social History   Tobacco Use   Smoking status: Former    Current packs/day: 0.00    Average packs/day: 1 pack/day for 12.0 years (12.0 ttl pk-yrs)    Types: Cigarettes    Start date: 04/29/1978    Quit date: 04/29/1990    Years since quitting: 33.0   Smokeless tobacco: Former    Quit date: 04/04/1989  Vaping Use   Vaping status: Never Used  Substance Use Topics   Alcohol use: No   Drug use: No    Allergies   Allergen Reactions   Codeine Other (See Comments)    Hallucinate, vomiting and sweating   Demerol [Meperidine] Other (See Comments)    Hallucinations, vomiting and sweating.    Dilaudid [Hydromorphone Hcl] Other (See Comments)    Hallucinations, sweating and vomiting   Hydromorphone Other (See Comments)   Morphine And Codeine Other (See Comments)    Hallucinations, vomiting and sweating      No outpatient medications have been marked as taking for the 05/08/23 encounter (Office Visit) with Vickki Hearing, MD.       Physical Exam BP 113/68   Pulse 73   Ht 5\' 4"  (1.626 m)   Wt 193 lb (87.5 kg)   BMI 33.13 kg/m   Ambulatory status  with assistive device: Walker  GENERAL : APPEARANCE IS NORMAL GROOMING IS GOOD  NORMAL MOOD AND AFFECT  AWAKE ALERT AND ORIENTED X 3   Right SHOULDER  TENDERNESS none ROM passive range of motion to 90 degrees abduction 110 degrees of flexion painful STABLE ANTERIORLY POSTERIORLY AND INFERIORLY SKIN CLEAN     PROVOCATIVE TESTS  DROP ARM TEST negative PAINFUL ARC 90 DEGREE HE IS UP TO 120 DEGREES    MEDICAL DECISION MAKING  A.  Encounter Diagnoses  Name Primary?   Chronic right shoulder pain Yes   Rotator cuff syndrome of right shoulder     B. DATA ANALYSED:    IMAGING: Independent interpretation of images: Negative x-ray  Orders: Injection right shoulder  Outside records reviewed: No  C. MANAGEMENT   Meds ordered this encounter  Medications   methylPREDNISolone acetate (DEPO-MEDROL) injection 40 mg    Recheck in 4 weeks

## 2023-05-10 DIAGNOSIS — Z944 Liver transplant status: Principal | ICD-10-CM

## 2023-05-10 MED ORDER — TACROLIMUS 1 MG CAPSULE, IMMEDIATE-RELEASE
ORAL_CAPSULE | Freq: Two times a day (BID) | ORAL | 0 refills | 2.00 days | Status: CP
Start: 2023-05-10 — End: 2023-05-10
  Filled 2023-05-10: qty 60, 30d supply, fill #0

## 2023-05-10 NOTE — Unmapped (Signed)
[facility] - Replacement Package    Tamara Morris 's package containing tacrolimus is being replaced due to lost in transit.    UPS Tracking Number: [id number]    Replacement package approved per Felicia's note below.

## 2023-05-10 NOTE — Unmapped (Signed)
Tamara Morris 's Tacrolimus shipment will be rescheduled as a result of lost package by UPS.     I have spoken with the patient 's daughter via incoming phone call and communicated the delivery change. We will reschedule the medication for the delivery date that the patient agreed upon.  We have confirmed the delivery date as 05/11/23

## 2023-05-10 NOTE — Unmapped (Signed)
Received on call page notification. Called pt's daughter who reported pt is completely out of tac and did not receive it yesterday as scheduled. She skipped dose yesterday and took took one this morning. Shipment is scheduled for delivery tomorrow. Pt's daughter ok with script for 2days to be send to local [facility]. She will call pharmacy in a few minutes to discuss out of pocket cost and let this coordinator know if it is affordable.

## 2023-05-11 DIAGNOSIS — Z944 Liver transplant status: Principal | ICD-10-CM

## 2023-05-11 MED ORDER — TACROLIMUS 1 MG CAPSULE, IMMEDIATE-RELEASE
ORAL_CAPSULE | Freq: Two times a day (BID) | ORAL | 11 refills | 30.00 days | Status: CP
Start: 2023-05-11 — End: ?

## 2023-05-11 NOTE — Unmapped (Signed)
Patient's daughter paged on-call TNC to report that she picked up her mother's 2-day Tac rx [facility] local [facility] and discussed pricing with the pharmacist b/c the patient's insurance does not cover her tacrolimus. She discovered that [facility] offers a discount to customers for non-insured meds, making it cheaper for her to get it from there. She requested this TNC send her full Tac prescription there for future refills. Rx sent per her request.

## 2023-05-12 DIAGNOSIS — E782 Mixed hyperlipidemia: Secondary | ICD-10-CM | POA: Diagnosis not present

## 2023-05-12 DIAGNOSIS — I129 Hypertensive chronic kidney disease with stage 1 through stage 4 chronic kidney disease, or unspecified chronic kidney disease: Secondary | ICD-10-CM | POA: Diagnosis not present

## 2023-05-12 DIAGNOSIS — Z0000 Encounter for general adult medical examination without abnormal findings: Secondary | ICD-10-CM | POA: Diagnosis not present

## 2023-05-12 DIAGNOSIS — R202 Paresthesia of skin: Secondary | ICD-10-CM | POA: Diagnosis not present

## 2023-05-12 DIAGNOSIS — F5105 Insomnia due to other mental disorder: Secondary | ICD-10-CM | POA: Diagnosis not present

## 2023-05-12 DIAGNOSIS — N1831 Chronic kidney disease, stage 3a: Secondary | ICD-10-CM | POA: Diagnosis not present

## 2023-05-12 DIAGNOSIS — E1165 Type 2 diabetes mellitus with hyperglycemia: Secondary | ICD-10-CM | POA: Diagnosis not present

## 2023-05-12 DIAGNOSIS — G4700 Insomnia, unspecified: Secondary | ICD-10-CM | POA: Diagnosis not present

## 2023-05-12 DIAGNOSIS — K580 Irritable bowel syndrome with diarrhea: Secondary | ICD-10-CM | POA: Diagnosis not present

## 2023-05-12 DIAGNOSIS — I10 Essential (primary) hypertension: Secondary | ICD-10-CM | POA: Diagnosis not present

## 2023-05-12 DIAGNOSIS — Z944 Liver transplant status: Secondary | ICD-10-CM | POA: Diagnosis not present

## 2023-05-12 DIAGNOSIS — M545 Low back pain, unspecified: Secondary | ICD-10-CM | POA: Diagnosis not present

## 2023-06-05 DIAGNOSIS — Z944 Liver transplant status: Principal | ICD-10-CM

## 2023-06-05 MED ORDER — TACROLIMUS 1 MG CAPSULE, IMMEDIATE-RELEASE
ORAL_CAPSULE | Freq: Two times a day (BID) | ORAL | 11 refills | 30.00 days | Status: CP
Start: 2023-06-05 — End: ?

## 2023-06-08 DIAGNOSIS — Z944 Liver transplant status: Principal | ICD-10-CM

## 2023-06-08 MED ORDER — TACROLIMUS 1 MG CAPSULE, IMMEDIATE-RELEASE
ORAL_CAPSULE | Freq: Two times a day (BID) | ORAL | 3 refills | 90.00 days
Start: 2023-06-08 — End: ?

## 2023-06-08 NOTE — Unmapped (Signed)
 Tamara Morris has been contacted in regards to their refill of Tacrolimus . At this time, they have declined refill due to  caregiver called states [facility] will [facility]  . Refill assessment call date has been updated per the patient's request.

## 2023-06-08 NOTE — Unmapped (Unsigned)
 Specialty Medication(s): tacrolimus    Ms.Crill has been dis-enrolled from the [employer] [facility] [employer] specialty pharmacy services as a result of a pharmacy change. The patient is now filling at [facility] .    Additional information provided to the patient: team member CB spoke with patient's caregiver today, verified patient will no longer fill at [facility], [facility]. They are aware to contact [facility] with any needs in the future. Clinic aware of disenrollment and [facility]    Thad Ranger, PharmD  [facility] Specialty Pharmacist

## 2023-06-08 NOTE — Unmapped (Signed)
 Pt request for RX Refill

## 2023-06-30 DIAGNOSIS — Z944 Liver transplant status: Principal | ICD-10-CM

## 2023-06-30 MED ORDER — TACROLIMUS 1 MG CAPSULE, IMMEDIATE-RELEASE
ORAL_CAPSULE | Freq: Two times a day (BID) | ORAL | 4 refills | 90 days | Status: CP
Start: 2023-06-30 — End: ?

## 2023-07-03 MED ORDER — TACROLIMUS 1 MG CAPSULE, IMMEDIATE-RELEASE
ORAL_CAPSULE | Freq: Two times a day (BID) | ORAL | 4 refills | 90 days
Start: 2023-07-03 — End: ?

## 2023-07-20 ENCOUNTER — Ambulatory Visit (HOSPITAL_COMMUNITY)
Admission: RE | Admit: 2023-07-20 | Discharge: 2023-07-20 | Disposition: A | Discharge status: Home / Self Care | Source: Ambulatory Visit | Attending: Vascular Surgery | Admitting: Vascular Surgery

## 2023-07-20 DIAGNOSIS — R300 Dysuria: Secondary | ICD-10-CM | POA: Diagnosis not present

## 2023-07-20 DIAGNOSIS — I6523 Occlusion and stenosis of bilateral carotid arteries: Secondary | ICD-10-CM | POA: Diagnosis not present

## 2023-07-20 DIAGNOSIS — N390 Urinary tract infection, site not specified: Secondary | ICD-10-CM | POA: Diagnosis not present

## 2023-07-20 DIAGNOSIS — R319 Hematuria, unspecified: Secondary | ICD-10-CM | POA: Diagnosis not present

## 2023-07-27 ENCOUNTER — Ambulatory Visit: Admit: 2023-07-27 | Discharge: 2023-07-28 | Payer: MEDICARE

## 2023-07-27 DIAGNOSIS — Z79810 Long term (current) use of selective estrogen receptor modulators (SERMs): Secondary | ICD-10-CM | POA: Diagnosis not present

## 2023-07-27 DIAGNOSIS — Z944 Liver transplant status: Secondary | ICD-10-CM | POA: Diagnosis not present

## 2023-08-03 ENCOUNTER — Ambulatory Visit: Admit: 2023-08-03 | Discharge: 2023-08-04 | Payer: MEDICARE | Attending: Anesthesiology | Primary: Anesthesiology

## 2023-08-03 DIAGNOSIS — M1611 Unilateral primary osteoarthritis, right hip: Secondary | ICD-10-CM | POA: Diagnosis not present

## 2023-08-03 DIAGNOSIS — E785 Hyperlipidemia, unspecified: Secondary | ICD-10-CM | POA: Diagnosis not present

## 2023-08-03 DIAGNOSIS — M1711 Unilateral primary osteoarthritis, right knee: Secondary | ICD-10-CM | POA: Diagnosis not present

## 2023-08-03 DIAGNOSIS — Z944 Liver transplant status: Secondary | ICD-10-CM | POA: Diagnosis not present

## 2023-08-03 DIAGNOSIS — F419 Anxiety disorder, unspecified: Secondary | ICD-10-CM | POA: Diagnosis not present

## 2023-08-03 DIAGNOSIS — M7061 Trochanteric bursitis, right hip: Secondary | ICD-10-CM | POA: Diagnosis not present

## 2023-08-03 DIAGNOSIS — M109 Gout, unspecified: Secondary | ICD-10-CM | POA: Diagnosis not present

## 2023-08-03 DIAGNOSIS — M25569 Pain in unspecified knee: Principal | ICD-10-CM

## 2023-11-03 DIAGNOSIS — E782 Mixed hyperlipidemia: Secondary | ICD-10-CM | POA: Diagnosis not present

## 2023-11-03 DIAGNOSIS — E1165 Type 2 diabetes mellitus with hyperglycemia: Secondary | ICD-10-CM | POA: Diagnosis not present

## 2023-11-03 DIAGNOSIS — E559 Vitamin D deficiency, unspecified: Secondary | ICD-10-CM | POA: Diagnosis not present

## 2023-11-05 DIAGNOSIS — Z944 Liver transplant status: Principal | ICD-10-CM

## 2023-11-10 DIAGNOSIS — E782 Mixed hyperlipidemia: Secondary | ICD-10-CM | POA: Diagnosis not present

## 2023-11-10 DIAGNOSIS — E875 Hyperkalemia: Secondary | ICD-10-CM | POA: Diagnosis not present

## 2023-11-10 DIAGNOSIS — I6521 Occlusion and stenosis of right carotid artery: Secondary | ICD-10-CM | POA: Diagnosis not present

## 2023-11-10 DIAGNOSIS — E1165 Type 2 diabetes mellitus with hyperglycemia: Secondary | ICD-10-CM | POA: Diagnosis not present

## 2023-11-10 DIAGNOSIS — I10 Essential (primary) hypertension: Secondary | ICD-10-CM | POA: Diagnosis not present

## 2023-11-10 DIAGNOSIS — E669 Obesity, unspecified: Secondary | ICD-10-CM | POA: Diagnosis not present

## 2023-11-10 DIAGNOSIS — K580 Irritable bowel syndrome with diarrhea: Secondary | ICD-10-CM | POA: Diagnosis not present

## 2023-11-10 DIAGNOSIS — M542 Cervicalgia: Secondary | ICD-10-CM | POA: Diagnosis not present

## 2023-11-10 DIAGNOSIS — G8929 Other chronic pain: Secondary | ICD-10-CM | POA: Diagnosis not present

## 2023-11-10 DIAGNOSIS — M545 Low back pain, unspecified: Secondary | ICD-10-CM | POA: Diagnosis not present

## 2023-11-10 DIAGNOSIS — E559 Vitamin D deficiency, unspecified: Secondary | ICD-10-CM | POA: Diagnosis not present

## 2023-11-10 DIAGNOSIS — G4700 Insomnia, unspecified: Secondary | ICD-10-CM | POA: Diagnosis not present

## 2024-01-02 DIAGNOSIS — H04123 Dry eye syndrome of bilateral lacrimal glands: Secondary | ICD-10-CM | POA: Diagnosis not present

## 2024-01-05 DIAGNOSIS — Z23 Encounter for immunization: Secondary | ICD-10-CM | POA: Diagnosis not present

## 2024-01-09 ENCOUNTER — Ambulatory Visit: Admit: 2024-01-09 | Discharge: 2024-01-10 | Payer: MEDICARE

## 2024-01-09 ENCOUNTER — Encounter: Admit: 2024-01-09 | Discharge: 2024-01-09 | Payer: MEDICARE

## 2024-01-09 DIAGNOSIS — Z944 Liver transplant status: Secondary | ICD-10-CM | POA: Diagnosis not present

## 2024-02-01 DIAGNOSIS — Z23 Encounter for immunization: Secondary | ICD-10-CM | POA: Diagnosis not present

## 2024-02-16 ENCOUNTER — Other Ambulatory Visit (HOSPITAL_COMMUNITY): Payer: Self-pay | Admitting: Internal Medicine

## 2024-02-16 DIAGNOSIS — Z1231 Encounter for screening mammogram for malignant neoplasm of breast: Secondary | ICD-10-CM

## 2024-03-07 ENCOUNTER — Ambulatory Visit: Admit: 2024-03-07 | Discharge: 2024-03-08 | Payer: MEDICARE

## 2024-03-07 DIAGNOSIS — Z944 Liver transplant status: Secondary | ICD-10-CM | POA: Diagnosis not present

## 2024-03-07 MED ORDER — TACROLIMUS 0.5 MG CAPSULE, IMMEDIATE-RELEASE
ORAL_CAPSULE | Freq: Every evening | ORAL | 11 refills | 90.00000 days | Status: CP
Start: 2024-03-07 — End: 2024-03-07

## 2024-03-08 ENCOUNTER — Ambulatory Visit (HOSPITAL_COMMUNITY)

## 2024-03-13 ENCOUNTER — Ambulatory Visit (HOSPITAL_COMMUNITY)

## 2024-03-18 ENCOUNTER — Inpatient Hospital Stay (HOSPITAL_COMMUNITY): Admission: RE | Admit: 2024-03-18 | Source: Ambulatory Visit

## 2024-04-12 ENCOUNTER — Encounter (HOSPITAL_COMMUNITY): Payer: Self-pay

## 2024-04-12 ENCOUNTER — Ambulatory Visit (HOSPITAL_COMMUNITY)
Admission: RE | Admit: 2024-04-12 | Discharge: 2024-04-12 | Disposition: A | Source: Ambulatory Visit | Attending: Internal Medicine | Admitting: Internal Medicine

## 2024-04-12 DIAGNOSIS — Z1231 Encounter for screening mammogram for malignant neoplasm of breast: Secondary | ICD-10-CM | POA: Insufficient documentation
